# Patient Record
Sex: Female | Born: 1990 | Race: White | Hispanic: No | Marital: Single | Smoking: Former smoker
Health system: Southern US, Community
[De-identification: ages and names within clinical notes are randomized; demographics above are authoritative.]

## PROBLEM LIST (undated history)

## (undated) ENCOUNTER — Inpatient Hospital Stay (HOSPITAL_COMMUNITY): Payer: Self-pay

## (undated) ENCOUNTER — Inpatient Hospital Stay: Payer: Self-pay

## (undated) DIAGNOSIS — O418X90 Other specified disorders of amniotic fluid and membranes, unspecified trimester, not applicable or unspecified: Principal | ICD-10-CM

## (undated) DIAGNOSIS — IMO0002 Reserved for concepts with insufficient information to code with codable children: Secondary | ICD-10-CM

## (undated) DIAGNOSIS — K219 Gastro-esophageal reflux disease without esophagitis: Secondary | ICD-10-CM

## (undated) DIAGNOSIS — O468X9 Other antepartum hemorrhage, unspecified trimester: Principal | ICD-10-CM

## (undated) DIAGNOSIS — Z87440 Personal history of urinary (tract) infections: Secondary | ICD-10-CM

## (undated) DIAGNOSIS — R87629 Unspecified abnormal cytological findings in specimens from vagina: Secondary | ICD-10-CM

## (undated) DIAGNOSIS — O09899 Supervision of other high risk pregnancies, unspecified trimester: Secondary | ICD-10-CM

## (undated) DIAGNOSIS — A749 Chlamydial infection, unspecified: Secondary | ICD-10-CM

## (undated) DIAGNOSIS — R87619 Unspecified abnormal cytological findings in specimens from cervix uteri: Secondary | ICD-10-CM

## (undated) DIAGNOSIS — D649 Anemia, unspecified: Secondary | ICD-10-CM

## (undated) HISTORY — DX: Personal history of urinary (tract) infections: Z87.440

## (undated) HISTORY — DX: Other specified disorders of amniotic fluid and membranes, unspecified trimester, not applicable or unspecified: O41.8X90

## (undated) HISTORY — DX: Reserved for concepts with insufficient information to code with codable children: IMO0002

## (undated) HISTORY — DX: Supervision of other high risk pregnancies, unspecified trimester: O09.899

## (undated) HISTORY — PX: DILATION AND CURETTAGE OF UTERUS: SHX78

## (undated) HISTORY — DX: Unspecified abnormal cytological findings in specimens from vagina: R87.629

## (undated) HISTORY — DX: Other antepartum hemorrhage, unspecified trimester: O46.8X9

## (undated) HISTORY — DX: Anemia, unspecified: D64.9

## (undated) HISTORY — PX: INDUCED ABORTION: SHX677

## (undated) HISTORY — DX: Unspecified abnormal cytological findings in specimens from cervix uteri: R87.619

---
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
## 2008-01-09 ENCOUNTER — Inpatient Hospital Stay (HOSPITAL_COMMUNITY): Admission: AD | Admit: 2008-01-09 | Discharge: 2008-01-09 | Payer: Self-pay | Admitting: Family Medicine

## 2008-01-13 ENCOUNTER — Inpatient Hospital Stay (HOSPITAL_COMMUNITY): Admission: AD | Admit: 2008-01-13 | Discharge: 2008-01-13 | Payer: Self-pay | Admitting: Obstetrics & Gynecology

## 2008-01-13 ENCOUNTER — Emergency Department (HOSPITAL_COMMUNITY): Admission: EM | Admit: 2008-01-13 | Discharge: 2008-01-13 | Payer: Self-pay | Admitting: Emergency Medicine

## 2008-02-18 ENCOUNTER — Inpatient Hospital Stay (HOSPITAL_COMMUNITY): Admission: AD | Admit: 2008-02-18 | Discharge: 2008-02-19 | Payer: Self-pay | Admitting: Obstetrics & Gynecology

## 2008-09-07 ENCOUNTER — Inpatient Hospital Stay (HOSPITAL_COMMUNITY): Admission: AD | Admit: 2008-09-07 | Discharge: 2008-09-07 | Payer: Self-pay | Admitting: Obstetrics and Gynecology

## 2008-10-14 ENCOUNTER — Inpatient Hospital Stay (HOSPITAL_COMMUNITY): Admission: AD | Admit: 2008-10-14 | Discharge: 2008-10-14 | Payer: Self-pay | Admitting: Obstetrics & Gynecology

## 2008-10-27 ENCOUNTER — Ambulatory Visit: Payer: Self-pay | Admitting: Obstetrics & Gynecology

## 2008-10-27 ENCOUNTER — Encounter: Payer: Self-pay | Admitting: Obstetrics and Gynecology

## 2008-10-27 LAB — CONVERTED CEMR LAB: hCG, Beta Chain, Quant, S: 97.4 milliintl units/mL

## 2008-11-30 ENCOUNTER — Emergency Department (HOSPITAL_COMMUNITY): Admission: EM | Admit: 2008-11-30 | Discharge: 2008-11-30 | Payer: Self-pay | Admitting: Emergency Medicine

## 2009-01-24 ENCOUNTER — Inpatient Hospital Stay (HOSPITAL_COMMUNITY): Admission: AD | Admit: 2009-01-24 | Discharge: 2009-01-24 | Payer: Self-pay | Admitting: Obstetrics & Gynecology

## 2009-04-29 ENCOUNTER — Inpatient Hospital Stay (HOSPITAL_COMMUNITY): Admission: AD | Admit: 2009-04-29 | Discharge: 2009-04-29 | Payer: Self-pay | Admitting: Obstetrics & Gynecology

## 2009-07-02 ENCOUNTER — Inpatient Hospital Stay (HOSPITAL_COMMUNITY): Admission: AD | Admit: 2009-07-02 | Discharge: 2009-07-02 | Payer: Self-pay | Admitting: Obstetrics and Gynecology

## 2009-07-31 ENCOUNTER — Inpatient Hospital Stay (HOSPITAL_COMMUNITY): Admission: AD | Admit: 2009-07-31 | Discharge: 2009-07-31 | Payer: Self-pay | Admitting: Family Medicine

## 2009-07-31 ENCOUNTER — Encounter: Payer: Self-pay | Admitting: Family Medicine

## 2009-07-31 ENCOUNTER — Ambulatory Visit: Payer: Self-pay | Admitting: Obstetrics and Gynecology

## 2009-08-02 ENCOUNTER — Ambulatory Visit: Payer: Self-pay | Admitting: Obstetrics and Gynecology

## 2009-08-02 ENCOUNTER — Ambulatory Visit (HOSPITAL_COMMUNITY): Admission: RE | Admit: 2009-08-02 | Discharge: 2009-08-02 | Payer: Self-pay | Admitting: Family Medicine

## 2009-08-14 ENCOUNTER — Encounter: Payer: Self-pay | Admitting: Family Medicine

## 2009-08-14 ENCOUNTER — Inpatient Hospital Stay (HOSPITAL_COMMUNITY): Admission: RE | Admit: 2009-08-14 | Discharge: 2009-08-14 | Payer: Self-pay | Admitting: Family Medicine

## 2009-08-14 ENCOUNTER — Ambulatory Visit: Payer: Self-pay | Admitting: Advanced Practice Midwife

## 2009-09-09 ENCOUNTER — Emergency Department (HOSPITAL_COMMUNITY): Admission: EM | Admit: 2009-09-09 | Discharge: 2009-09-09 | Payer: Self-pay | Admitting: Emergency Medicine

## 2009-09-23 ENCOUNTER — Inpatient Hospital Stay (HOSPITAL_COMMUNITY): Admission: AD | Admit: 2009-09-23 | Discharge: 2009-09-23 | Payer: Self-pay | Admitting: Obstetrics & Gynecology

## 2009-12-20 ENCOUNTER — Emergency Department (HOSPITAL_COMMUNITY): Admission: EM | Admit: 2009-12-20 | Discharge: 2009-12-20 | Payer: Self-pay | Admitting: Emergency Medicine

## 2010-02-12 ENCOUNTER — Inpatient Hospital Stay (HOSPITAL_COMMUNITY): Admission: AD | Admit: 2010-02-12 | Discharge: 2010-02-12 | Payer: Self-pay | Admitting: Obstetrics & Gynecology

## 2010-03-19 ENCOUNTER — Ambulatory Visit: Payer: Self-pay | Admitting: Nurse Practitioner

## 2010-03-19 ENCOUNTER — Inpatient Hospital Stay (HOSPITAL_COMMUNITY): Admission: AD | Admit: 2010-03-19 | Discharge: 2010-03-19 | Payer: Self-pay | Admitting: Obstetrics & Gynecology

## 2010-05-21 ENCOUNTER — Inpatient Hospital Stay (HOSPITAL_COMMUNITY)
Admission: AD | Admit: 2010-05-21 | Discharge: 2010-05-21 | Payer: Self-pay | Source: Home / Self Care | Attending: Family Medicine | Admitting: Family Medicine

## 2010-05-23 LAB — URINALYSIS, ROUTINE W REFLEX MICROSCOPIC
Bilirubin Urine: NEGATIVE
Hgb urine dipstick: NEGATIVE
Ketones, ur: NEGATIVE mg/dL
Nitrite: NEGATIVE
Protein, ur: NEGATIVE mg/dL
Specific Gravity, Urine: 1.02 (ref 1.005–1.030)
Urine Glucose, Fasting: NEGATIVE mg/dL
Urobilinogen, UA: 0.2 mg/dL (ref 0.0–1.0)
pH: 6.5 (ref 5.0–8.0)

## 2010-05-23 LAB — CBC
HCT: 36.9 % (ref 36.0–46.0)
Hemoglobin: 12.2 g/dL (ref 12.0–15.0)
MCH: 30.1 pg (ref 26.0–34.0)
MCHC: 33.1 g/dL (ref 30.0–36.0)
MCV: 91.1 fL (ref 78.0–100.0)
Platelets: 237 10*3/uL (ref 150–400)
RBC: 4.05 MIL/uL (ref 3.87–5.11)
RDW: 11.5 % (ref 11.5–15.5)
WBC: 5.7 10*3/uL (ref 4.0–10.5)

## 2010-05-23 LAB — WET PREP, GENITAL
Clue Cells Wet Prep HPF POC: NONE SEEN
Trich, Wet Prep: NONE SEEN
Yeast Wet Prep HPF POC: NONE SEEN

## 2010-05-23 LAB — POCT PREGNANCY, URINE: Preg Test, Ur: NEGATIVE

## 2010-05-23 LAB — GC/CHLAMYDIA PROBE AMP, GENITAL
Chlamydia, DNA Probe: NEGATIVE
GC Probe Amp, Genital: NEGATIVE

## 2010-05-27 ENCOUNTER — Encounter: Payer: Self-pay | Admitting: *Deleted

## 2010-07-11 ENCOUNTER — Inpatient Hospital Stay (HOSPITAL_COMMUNITY)
Admission: AD | Admit: 2010-07-11 | Discharge: 2010-07-11 | Disposition: A | Discharge status: Home / Self Care | Payer: Medicaid Other | Source: Ambulatory Visit | Attending: Obstetrics & Gynecology | Admitting: Obstetrics & Gynecology

## 2010-07-11 DIAGNOSIS — N946 Dysmenorrhea, unspecified: Secondary | ICD-10-CM

## 2010-07-11 LAB — WET PREP, GENITAL
Clue Cells Wet Prep HPF POC: NONE SEEN
Trich, Wet Prep: NONE SEEN
Yeast Wet Prep HPF POC: NONE SEEN

## 2010-07-11 LAB — DIFFERENTIAL
Basophils Absolute: 0 10*3/uL (ref 0.0–0.1)
Basophils Relative: 0 % (ref 0–1)
Eosinophils Absolute: 0.1 10*3/uL (ref 0.0–0.7)
Eosinophils Relative: 2 % (ref 0–5)
Lymphocytes Relative: 35 % (ref 12–46)
Lymphs Abs: 2.3 10*3/uL (ref 0.7–4.0)
Monocytes Absolute: 0.4 10*3/uL (ref 0.1–1.0)
Monocytes Relative: 7 % (ref 3–12)
Neutro Abs: 3.7 10*3/uL (ref 1.7–7.7)
Neutrophils Relative %: 56 % (ref 43–77)

## 2010-07-11 LAB — POCT PREGNANCY, URINE: Preg Test, Ur: NEGATIVE

## 2010-07-11 LAB — CBC
HCT: 38.6 % (ref 36.0–46.0)
Hemoglobin: 12.6 g/dL (ref 12.0–15.0)
MCH: 29.6 pg (ref 26.0–34.0)
MCHC: 32.6 g/dL (ref 30.0–36.0)
MCV: 90.6 fL (ref 78.0–100.0)
Platelets: 258 10*3/uL (ref 150–400)
RBC: 4.26 MIL/uL (ref 3.87–5.11)
RDW: 12.4 % (ref 11.5–15.5)
WBC: 6.6 10*3/uL (ref 4.0–10.5)

## 2010-07-11 LAB — HCG, QUANTITATIVE, PREGNANCY: hCG, Beta Chain, Quant, S: <2 m[IU]/mL (ref <5)

## 2010-07-12 LAB — GC/CHLAMYDIA PROBE AMP, GENITAL
Chlamydia, DNA Probe: NEGATIVE
GC Probe Amp, Genital: NEGATIVE

## 2010-07-17 LAB — DIFFERENTIAL
Basophils Absolute: 0 10*3/uL (ref 0.0–0.1)
Basophils Relative: 0 % (ref 0–1)
Eosinophils Absolute: 0 10*3/uL (ref 0.0–0.7)
Eosinophils Relative: 0 % (ref 0–5)
Lymphocytes Relative: 18 % (ref 12–46)
Lymphs Abs: 2.3 10*3/uL (ref 0.7–4.0)
Monocytes Absolute: 0.7 10*3/uL (ref 0.1–1.0)
Monocytes Relative: 6 % (ref 3–12)
Neutro Abs: 9.8 10*3/uL — ABNORMAL HIGH (ref 1.7–7.7)
Neutrophils Relative %: 76 % (ref 43–77)

## 2010-07-17 LAB — CBC
HCT: 30.3 % — ABNORMAL LOW (ref 36.0–46.0)
Hemoglobin: 10.4 g/dL — ABNORMAL LOW (ref 12.0–15.0)
MCH: 32 pg (ref 26.0–34.0)
MCHC: 34.3 g/dL (ref 30.0–36.0)
MCV: 93.3 fL (ref 78.0–100.0)
Platelets: 250 10*3/uL (ref 150–400)
RBC: 3.25 MIL/uL — ABNORMAL LOW (ref 3.87–5.11)
RDW: 12.9 % (ref 11.5–15.5)
WBC: 12.9 10*3/uL — ABNORMAL HIGH (ref 4.0–10.5)

## 2010-07-17 LAB — WET PREP, GENITAL
Clue Cells Wet Prep HPF POC: NONE SEEN
Trich, Wet Prep: NONE SEEN
Yeast Wet Prep HPF POC: NONE SEEN

## 2010-07-17 LAB — SAMPLE TO BLOOD BANK

## 2010-07-17 LAB — GC/CHLAMYDIA PROBE AMP, GENITAL
Chlamydia, DNA Probe: NEGATIVE
GC Probe Amp, Genital: NEGATIVE

## 2010-07-19 LAB — URINE CULTURE
Colony Count: 15000
Culture  Setup Time: 201110110736

## 2010-07-19 LAB — URINALYSIS, ROUTINE W REFLEX MICROSCOPIC
Bilirubin Urine: NEGATIVE
Glucose, UA: NEGATIVE mg/dL
Hgb urine dipstick: NEGATIVE
Ketones, ur: 15 mg/dL — AB
Nitrite: NEGATIVE
Protein, ur: NEGATIVE mg/dL
Specific Gravity, Urine: 1.025 (ref 1.005–1.030)
Urobilinogen, UA: 1 mg/dL (ref 0.0–1.0)
pH: 5.5 (ref 5.0–8.0)

## 2010-07-19 LAB — CBC
HCT: 35.8 % — ABNORMAL LOW (ref 36.0–46.0)
Hemoglobin: 12.2 g/dL (ref 12.0–15.0)
MCH: 31.9 pg (ref 26.0–34.0)
MCHC: 34 g/dL (ref 30.0–36.0)
MCV: 93.6 fL (ref 78.0–100.0)
Platelets: 245 10*3/uL (ref 150–400)
RBC: 3.82 MIL/uL — ABNORMAL LOW (ref 3.87–5.11)
RDW: 12.7 % (ref 11.5–15.5)
WBC: 9.8 10*3/uL (ref 4.0–10.5)

## 2010-07-19 LAB — WET PREP, GENITAL
Clue Cells Wet Prep HPF POC: NONE SEEN
Trich, Wet Prep: NONE SEEN
Yeast Wet Prep HPF POC: NONE SEEN

## 2010-07-19 LAB — GC/CHLAMYDIA PROBE AMP, GENITAL
Chlamydia, DNA Probe: NEGATIVE
GC Probe Amp, Genital: NEGATIVE

## 2010-07-19 LAB — ABO/RH: ABO/RH(D): O POS

## 2010-07-19 LAB — URINE MICROSCOPIC-ADD ON

## 2010-07-19 LAB — POCT PREGNANCY, URINE: Preg Test, Ur: POSITIVE

## 2010-07-23 LAB — URINE MICROSCOPIC-ADD ON

## 2010-07-23 LAB — URINALYSIS, ROUTINE W REFLEX MICROSCOPIC
Bilirubin Urine: NEGATIVE
Glucose, UA: NEGATIVE mg/dL
Hgb urine dipstick: NEGATIVE
Ketones, ur: NEGATIVE mg/dL
Nitrite: NEGATIVE
Protein, ur: NEGATIVE mg/dL
Specific Gravity, Urine: >1.03 — ABNORMAL HIGH (ref 1.005–1.030)
Urobilinogen, UA: 0.2 mg/dL (ref 0.0–1.0)
pH: 6 (ref 5.0–8.0)

## 2010-07-23 LAB — URINE CULTURE
Colony Count: NO GROWTH
Culture: NO GROWTH

## 2010-07-23 LAB — POCT PREGNANCY, URINE
Preg Test, Ur: NEGATIVE
Preg Test, Ur: POSITIVE

## 2010-07-29 LAB — POCT PREGNANCY, URINE: Preg Test, Ur: POSITIVE

## 2010-07-29 LAB — CBC
HCT: 36.9 % (ref 36.0–46.0)
Hemoglobin: 12.5 g/dL (ref 12.0–15.0)
MCHC: 33.8 g/dL (ref 30.0–36.0)
MCV: 93.2 fL (ref 78.0–100.0)
Platelets: 245 10*3/uL (ref 150–400)
RBC: 3.96 MIL/uL (ref 3.87–5.11)
RDW: 12.5 % (ref 11.5–15.5)
WBC: 7.9 10*3/uL (ref 4.0–10.5)

## 2010-07-29 LAB — GC/CHLAMYDIA PROBE AMP, GENITAL
Chlamydia, DNA Probe: NEGATIVE
GC Probe Amp, Genital: NEGATIVE

## 2010-07-29 LAB — WET PREP, GENITAL
Trich, Wet Prep: NONE SEEN
Yeast Wet Prep HPF POC: NONE SEEN

## 2010-07-29 LAB — HCG, QUANTITATIVE, PREGNANCY
hCG, Beta Chain, Quant, S: 2154 m[IU]/mL — ABNORMAL HIGH (ref <5)
hCG, Beta Chain, Quant, S: 5162 m[IU]/mL — ABNORMAL HIGH (ref <5)

## 2010-08-06 LAB — URINALYSIS, ROUTINE W REFLEX MICROSCOPIC
Bilirubin Urine: NEGATIVE
Glucose, UA: NEGATIVE mg/dL
Hgb urine dipstick: NEGATIVE
Ketones, ur: NEGATIVE mg/dL
Nitrite: NEGATIVE
Protein, ur: NEGATIVE mg/dL
Specific Gravity, Urine: 1.02 (ref 1.005–1.030)
Urobilinogen, UA: 1 mg/dL (ref 0.0–1.0)
pH: 6 (ref 5.0–8.0)

## 2010-08-06 LAB — POCT PREGNANCY, URINE: Preg Test, Ur: NEGATIVE

## 2010-08-10 LAB — CBC
HCT: 34.4 % — ABNORMAL LOW (ref 36.0–46.0)
Hemoglobin: 11.7 g/dL — ABNORMAL LOW (ref 12.0–15.0)
MCHC: 34 g/dL (ref 30.0–36.0)
MCV: 90.8 fL (ref 78.0–100.0)
Platelets: 228 10*3/uL (ref 150–400)
RBC: 3.78 MIL/uL — ABNORMAL LOW (ref 3.87–5.11)
RDW: 13.3 % (ref 11.5–15.5)
WBC: 9.7 10*3/uL (ref 4.0–10.5)

## 2010-08-10 LAB — URINALYSIS, ROUTINE W REFLEX MICROSCOPIC
Bilirubin Urine: NEGATIVE
Glucose, UA: NEGATIVE mg/dL
Hgb urine dipstick: NEGATIVE
Ketones, ur: NEGATIVE mg/dL
Nitrite: NEGATIVE
Protein, ur: NEGATIVE mg/dL
Specific Gravity, Urine: 1.025 (ref 1.005–1.030)
Urobilinogen, UA: 1 mg/dL (ref 0.0–1.0)
pH: 5.5 (ref 5.0–8.0)

## 2010-08-10 LAB — WET PREP, GENITAL
Trich, Wet Prep: NONE SEEN
Yeast Wet Prep HPF POC: NONE SEEN

## 2010-08-10 LAB — GC/CHLAMYDIA PROBE AMP, GENITAL
Chlamydia, DNA Probe: NEGATIVE
GC Probe Amp, Genital: NEGATIVE

## 2010-08-10 LAB — ABO/RH: ABO/RH(D): O POS

## 2010-08-10 LAB — POCT PREGNANCY, URINE: Preg Test, Ur: POSITIVE

## 2010-08-10 LAB — HCG, QUANTITATIVE, PREGNANCY: hCG, Beta Chain, Quant, S: 5802 m[IU]/mL — ABNORMAL HIGH (ref <5)

## 2010-08-13 LAB — CBC
HCT: 34.1 % — ABNORMAL LOW (ref 36.0–46.0)
Hemoglobin: 12.1 g/dL (ref 12.0–15.0)
MCHC: 35.5 g/dL (ref 30.0–36.0)
MCV: 92.5 fL (ref 78.0–100.0)
Platelets: 229 10*3/uL (ref 150–400)
RBC: 3.69 MIL/uL — ABNORMAL LOW (ref 3.87–5.11)
RDW: 12.9 % (ref 11.5–15.5)
WBC: 14.4 10*3/uL — ABNORMAL HIGH (ref 4.0–10.5)

## 2010-08-13 LAB — WET PREP, GENITAL
Trich, Wet Prep: NONE SEEN
Yeast Wet Prep HPF POC: NONE SEEN

## 2010-08-13 LAB — ABO/RH: ABO/RH(D): O POS

## 2010-08-14 LAB — CBC
HCT: 34.6 % — ABNORMAL LOW (ref 36.0–46.0)
Hemoglobin: 11.9 g/dL — ABNORMAL LOW (ref 12.0–15.0)
MCHC: 34.4 g/dL (ref 30.0–36.0)
MCV: 93.1 fL (ref 78.0–100.0)
Platelets: 248 10*3/uL (ref 150–400)
RBC: 3.71 MIL/uL — ABNORMAL LOW (ref 3.87–5.11)
RDW: 12.7 % (ref 11.5–15.5)
WBC: 10.5 10*3/uL (ref 4.0–10.5)

## 2010-08-14 LAB — URINALYSIS, ROUTINE W REFLEX MICROSCOPIC
Bilirubin Urine: NEGATIVE
Glucose, UA: NEGATIVE mg/dL
Hgb urine dipstick: NEGATIVE
Ketones, ur: NEGATIVE mg/dL
Nitrite: NEGATIVE
Protein, ur: NEGATIVE mg/dL
Specific Gravity, Urine: >1.03 — ABNORMAL HIGH (ref 1.005–1.030)
Urobilinogen, UA: 1 mg/dL (ref 0.0–1.0)
pH: 6 (ref 5.0–8.0)

## 2010-08-14 LAB — GC/CHLAMYDIA PROBE AMP, GENITAL
Chlamydia, DNA Probe: NEGATIVE
GC Probe Amp, Genital: NEGATIVE

## 2010-08-14 LAB — WET PREP, GENITAL
Trich, Wet Prep: NONE SEEN
Yeast Wet Prep HPF POC: NONE SEEN

## 2010-08-14 LAB — HCG, QUANTITATIVE, PREGNANCY: hCG, Beta Chain, Quant, S: 101640 m[IU]/mL — ABNORMAL HIGH (ref <5)

## 2010-08-14 LAB — ABO/RH: ABO/RH(D): O POS

## 2010-08-19 ENCOUNTER — Inpatient Hospital Stay (HOSPITAL_COMMUNITY): Payer: Medicaid Other

## 2010-08-19 ENCOUNTER — Inpatient Hospital Stay (HOSPITAL_COMMUNITY)
Admission: AD | Admit: 2010-08-19 | Discharge: 2010-08-19 | Disposition: A | Discharge status: Home / Self Care | Payer: Medicaid Other | Source: Ambulatory Visit | Attending: Obstetrics & Gynecology | Admitting: Obstetrics & Gynecology

## 2010-08-19 DIAGNOSIS — N949 Unspecified condition associated with female genital organs and menstrual cycle: Secondary | ICD-10-CM

## 2010-08-19 DIAGNOSIS — R1032 Left lower quadrant pain: Secondary | ICD-10-CM | POA: Insufficient documentation

## 2010-08-19 LAB — URINALYSIS, ROUTINE W REFLEX MICROSCOPIC
Bilirubin Urine: NEGATIVE
Glucose, UA: NEGATIVE mg/dL
Hgb urine dipstick: NEGATIVE
Ketones, ur: NEGATIVE mg/dL
Nitrite: NEGATIVE
Protein, ur: NEGATIVE mg/dL
Specific Gravity, Urine: 1.02 (ref 1.005–1.030)
Urobilinogen, UA: 1 mg/dL (ref 0.0–1.0)
pH: 7 (ref 5.0–8.0)

## 2010-08-19 LAB — WET PREP, GENITAL
Clue Cells Wet Prep HPF POC: NONE SEEN
Trich, Wet Prep: NONE SEEN
Yeast Wet Prep HPF POC: NONE SEEN

## 2010-08-19 LAB — POCT PREGNANCY, URINE: Preg Test, Ur: NEGATIVE

## 2010-08-20 LAB — GC/CHLAMYDIA PROBE AMP, GENITAL
Chlamydia, DNA Probe: NEGATIVE
GC Probe Amp, Genital: NEGATIVE

## 2010-09-18 NOTE — Group Therapy Note (Signed)
NAMETIMBERLEE, ROBLERO             ACCOUNT NO.:  [id number]   MEDICAL RECORD NO.:  [id number]          PATIENT TYPE:  WOC   LOCATION:  [facility]                   FACILITY:  [facility]   PHYSICIAN:  Argentina Donovan, MD        DATE OF BIRTH:  Oct 11, 1990   DATE OF SERVICE:  10/27/2008                                  CLINIC NOTE   The patient is a 20 year old gravida 2, para 1-0-1-1 who was seen in the  maternity admissions on October 14, 2008, with a missed AB treated with  Cytotec.  She last delivered in December of last year and she was given  the Cytotec on a Thursday and she did not pass the products of  conception until the following Thursday, i.e., 1 week ago.  She has not  had any bleeding since that time and we are going to get a quantitative  beta to see if her beta has significantly dropped or whether she may  have a possibility of an incomplete and need an ultrasound to confirm  that.  The patient plans on getting pregnant soon.  I have talked to her  about avoiding pregnancy the next 2 months, to start on vitamins with  folic acid, and why she should do that.  We had a long talk about  incidence and frequency of miscarriage and she seems to understand.  She  also wanted to know about determining when she could ovulate and how to  determine ovulation.  We talked about temperature chart and ovulating  predicting testing devices.   IMPRESSION:  Probable complete abortion pending quantitative beta.           ______________________________  Argentina Donovan, MD     PR/MEDQ  D:  10/27/2008  T:  10/27/2008  Job:  [id number]

## 2010-10-10 ENCOUNTER — Inpatient Hospital Stay (HOSPITAL_COMMUNITY)
Admission: AD | Admit: 2010-10-10 | Discharge: 2010-10-10 | Disposition: A | Discharge status: Home / Self Care | Payer: Medicaid Other | Source: Ambulatory Visit | Attending: Obstetrics & Gynecology | Admitting: Obstetrics & Gynecology

## 2010-10-10 ENCOUNTER — Inpatient Hospital Stay (HOSPITAL_COMMUNITY): Payer: Medicaid Other

## 2010-10-10 DIAGNOSIS — O200 Threatened abortion: Secondary | ICD-10-CM | POA: Insufficient documentation

## 2010-10-10 DIAGNOSIS — O209 Hemorrhage in early pregnancy, unspecified: Secondary | ICD-10-CM

## 2010-10-10 LAB — URINALYSIS, ROUTINE W REFLEX MICROSCOPIC
Bilirubin Urine: NEGATIVE
Glucose, UA: NEGATIVE mg/dL
Hgb urine dipstick: NEGATIVE
Ketones, ur: NEGATIVE mg/dL
Nitrite: NEGATIVE
Protein, ur: NEGATIVE mg/dL
Specific Gravity, Urine: 1.015 (ref 1.005–1.030)
Urobilinogen, UA: 0.2 mg/dL (ref 0.0–1.0)
pH: 6.5 (ref 5.0–8.0)

## 2010-10-10 LAB — CBC
HCT: 35.9 % — ABNORMAL LOW (ref 36.0–46.0)
Hemoglobin: 12.3 g/dL (ref 12.0–15.0)
MCH: 31.1 pg (ref 26.0–34.0)
MCHC: 34.3 g/dL (ref 30.0–36.0)
MCV: 90.7 fL (ref 78.0–100.0)
Platelets: 232 10*3/uL (ref 150–400)
RBC: 3.96 MIL/uL (ref 3.87–5.11)
RDW: 11.8 % (ref 11.5–15.5)
WBC: 8 10*3/uL (ref 4.0–10.5)

## 2010-10-10 LAB — HCG, QUANTITATIVE, PREGNANCY: hCG, Beta Chain, Quant, S: 11079 m[IU]/mL — ABNORMAL HIGH (ref <5)

## 2010-10-10 LAB — ABO/RH: ABO/RH(D): O POS

## 2010-10-10 LAB — WET PREP, GENITAL
Clue Cells Wet Prep HPF POC: NONE SEEN
Trich, Wet Prep: NONE SEEN
Yeast Wet Prep HPF POC: NONE SEEN

## 2010-10-10 LAB — POCT PREGNANCY, URINE: Preg Test, Ur: POSITIVE

## 2010-10-11 LAB — GC/CHLAMYDIA PROBE AMP, GENITAL
Chlamydia, DNA Probe: NEGATIVE
GC Probe Amp, Genital: NEGATIVE

## 2010-10-20 ENCOUNTER — Emergency Department (HOSPITAL_COMMUNITY)
Admission: EM | Admit: 2010-10-20 | Discharge: 2010-10-21 | Disposition: A | Discharge status: Home / Self Care | Payer: Medicaid Other | Attending: Emergency Medicine | Admitting: Emergency Medicine

## 2010-10-20 DIAGNOSIS — N760 Acute vaginitis: Secondary | ICD-10-CM | POA: Insufficient documentation

## 2010-10-20 DIAGNOSIS — A499 Bacterial infection, unspecified: Secondary | ICD-10-CM | POA: Insufficient documentation

## 2010-10-20 DIAGNOSIS — B9689 Other specified bacterial agents as the cause of diseases classified elsewhere: Secondary | ICD-10-CM | POA: Insufficient documentation

## 2010-10-20 DIAGNOSIS — O2390 Unspecified genitourinary tract infection in pregnancy, unspecified trimester: Secondary | ICD-10-CM | POA: Insufficient documentation

## 2010-10-21 LAB — URINALYSIS, ROUTINE W REFLEX MICROSCOPIC
Bilirubin Urine: NEGATIVE
Glucose, UA: NEGATIVE mg/dL
Hgb urine dipstick: NEGATIVE
Ketones, ur: NEGATIVE mg/dL
Nitrite: NEGATIVE
Protein, ur: NEGATIVE mg/dL
Specific Gravity, Urine: 1.024 (ref 1.005–1.030)
Urobilinogen, UA: 1 mg/dL (ref 0.0–1.0)
pH: 6 (ref 5.0–8.0)

## 2010-10-21 LAB — URINE MICROSCOPIC-ADD ON

## 2010-10-21 LAB — WET PREP, GENITAL
Trich, Wet Prep: NONE SEEN
Yeast Wet Prep HPF POC: NONE SEEN

## 2010-10-22 ENCOUNTER — Inpatient Hospital Stay (HOSPITAL_COMMUNITY)
Admission: AD | Admit: 2010-10-22 | Discharge: 2010-10-22 | Disposition: A | Discharge status: Home / Self Care | Payer: Medicaid Other | Source: Ambulatory Visit | Attending: Family Medicine | Admitting: Family Medicine

## 2010-10-22 ENCOUNTER — Ambulatory Visit (HOSPITAL_COMMUNITY)
Admission: RE | Admit: 2010-10-22 | Discharge: 2010-10-22 | Disposition: A | Discharge status: Home / Self Care | Payer: Medicaid Other | Source: Ambulatory Visit | Attending: Obstetrics & Gynecology | Admitting: Obstetrics & Gynecology

## 2010-10-22 DIAGNOSIS — O209 Hemorrhage in early pregnancy, unspecified: Secondary | ICD-10-CM | POA: Insufficient documentation

## 2010-10-22 DIAGNOSIS — O99891 Other specified diseases and conditions complicating pregnancy: Secondary | ICD-10-CM | POA: Insufficient documentation

## 2010-10-22 DIAGNOSIS — O468X9 Other antepartum hemorrhage, unspecified trimester: Secondary | ICD-10-CM

## 2010-10-22 DIAGNOSIS — O418X90 Other specified disorders of amniotic fluid and membranes, unspecified trimester, not applicable or unspecified: Secondary | ICD-10-CM

## 2010-10-22 DIAGNOSIS — Z3689 Encounter for other specified antenatal screening: Secondary | ICD-10-CM | POA: Insufficient documentation

## 2010-10-22 HISTORY — DX: Other specified disorders of amniotic fluid and membranes, unspecified trimester, not applicable or unspecified: O41.8X90

## 2010-10-22 HISTORY — DX: Other antepartum hemorrhage, unspecified trimester: O46.8X9

## 2010-10-22 LAB — GC/CHLAMYDIA PROBE AMP, GENITAL
Chlamydia, DNA Probe: NEGATIVE
GC Probe Amp, Genital: NEGATIVE

## 2010-12-02 ENCOUNTER — Encounter (HOSPITAL_COMMUNITY): Payer: Self-pay | Admitting: *Deleted

## 2010-12-02 ENCOUNTER — Inpatient Hospital Stay (HOSPITAL_COMMUNITY)
Admission: AD | Admit: 2010-12-02 | Discharge: 2010-12-02 | Disposition: A | Discharge status: Home / Self Care | Payer: Medicaid Other | Source: Ambulatory Visit | Attending: Obstetrics & Gynecology | Admitting: Obstetrics & Gynecology

## 2010-12-02 DIAGNOSIS — Z331 Pregnant state, incidental: Secondary | ICD-10-CM

## 2010-12-02 DIAGNOSIS — Z3480 Encounter for supervision of other normal pregnancy, unspecified trimester: Secondary | ICD-10-CM | POA: Insufficient documentation

## 2010-12-02 LAB — URINALYSIS, ROUTINE W REFLEX MICROSCOPIC
Bilirubin Urine: NEGATIVE
Glucose, UA: NEGATIVE mg/dL
Hgb urine dipstick: NEGATIVE
Ketones, ur: NEGATIVE mg/dL
Leukocytes, UA: NEGATIVE
Nitrite: NEGATIVE
Protein, ur: NEGATIVE mg/dL
Specific Gravity, Urine: >1.03 — ABNORMAL HIGH (ref 1.005–1.030)
Urobilinogen, UA: 2 mg/dL — ABNORMAL HIGH (ref 0.0–1.0)
pH: 6 (ref 5.0–8.0)

## 2010-12-02 LAB — WET PREP, GENITAL
Clue Cells Wet Prep HPF POC: NONE SEEN
Trich, Wet Prep: NONE SEEN
Yeast Wet Prep HPF POC: NONE SEEN

## 2010-12-02 MED ORDER — PRENATAL RX 60-1 MG PO TABS: 1.0000 | ORAL_TABLET | Freq: Every day | ORAL | Status: DC
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
## 2010-12-02 NOTE — Progress Notes (Signed)
W. Muhammad, CNM at bedside.  Assessment done and poc discussed with pt.   
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
## 2010-12-08 ENCOUNTER — Inpatient Hospital Stay (HOSPITAL_COMMUNITY)
Admission: AD | Admit: 2010-12-08 | Discharge: 2010-12-08 | Disposition: A | Discharge status: Home / Self Care | Payer: Medicaid Other | Source: Ambulatory Visit | Attending: Obstetrics & Gynecology | Admitting: Obstetrics & Gynecology

## 2010-12-08 ENCOUNTER — Encounter (HOSPITAL_COMMUNITY): Payer: Self-pay | Admitting: *Deleted

## 2010-12-08 ENCOUNTER — Inpatient Hospital Stay (HOSPITAL_COMMUNITY): Payer: Medicaid Other

## 2010-12-08 DIAGNOSIS — M549 Dorsalgia, unspecified: Secondary | ICD-10-CM | POA: Insufficient documentation

## 2010-12-08 DIAGNOSIS — T148XXA Other injury of unspecified body region, initial encounter: Secondary | ICD-10-CM

## 2010-12-08 DIAGNOSIS — O99891 Other specified diseases and conditions complicating pregnancy: Secondary | ICD-10-CM | POA: Insufficient documentation

## 2010-12-08 DIAGNOSIS — R109 Unspecified abdominal pain: Secondary | ICD-10-CM | POA: Insufficient documentation

## 2010-12-08 LAB — URINALYSIS, ROUTINE W REFLEX MICROSCOPIC
Bilirubin Urine: NEGATIVE
Glucose, UA: NEGATIVE mg/dL
Hgb urine dipstick: NEGATIVE
Ketones, ur: 15 mg/dL — AB
Nitrite: NEGATIVE
Protein, ur: NEGATIVE mg/dL
Specific Gravity, Urine: >1.03 — ABNORMAL HIGH (ref 1.005–1.030)
Urobilinogen, UA: 1 mg/dL (ref 0.0–1.0)
pH: 5.5 (ref 5.0–8.0)

## 2010-12-08 LAB — URINE MICROSCOPIC-ADD ON
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
## 2011-01-15 ENCOUNTER — Other Ambulatory Visit: Payer: Self-pay | Admitting: Family Medicine

## 2011-01-15 DIAGNOSIS — Z3689 Encounter for other specified antenatal screening: Secondary | ICD-10-CM

## 2011-01-15 LAB — HIV ANTIBODY (ROUTINE TESTING W REFLEX)
HIV: NONREACTIVE
HIV: NONREACTIVE

## 2011-01-15 LAB — RPR: RPR: NONREACTIVE

## 2011-01-15 LAB — HEPATITIS B SURFACE ANTIGEN: Hepatitis B Surface Ag: NEGATIVE

## 2011-01-17 ENCOUNTER — Ambulatory Visit (HOSPITAL_COMMUNITY)
Admission: RE | Admit: 2011-01-17 | Discharge: 2011-01-17 | Disposition: A | Discharge status: Home / Self Care | Payer: Medicaid Other | Source: Ambulatory Visit | Attending: Family Medicine | Admitting: Family Medicine

## 2011-01-17 DIAGNOSIS — Z3689 Encounter for other specified antenatal screening: Secondary | ICD-10-CM

## 2011-01-17 DIAGNOSIS — Z1389 Encounter for screening for other disorder: Secondary | ICD-10-CM | POA: Insufficient documentation

## 2011-01-17 DIAGNOSIS — Z363 Encounter for antenatal screening for malformations: Secondary | ICD-10-CM | POA: Insufficient documentation

## 2011-01-17 DIAGNOSIS — O358XX0 Maternal care for other (suspected) fetal abnormality and damage, not applicable or unspecified: Secondary | ICD-10-CM | POA: Insufficient documentation

## 2011-01-23 DIAGNOSIS — Z862 Personal history of diseases of the blood and blood-forming organs and certain disorders involving the immune mechanism: Secondary | ICD-10-CM | POA: Insufficient documentation

## 2011-01-23 DIAGNOSIS — Z87440 Personal history of urinary (tract) infections: Secondary | ICD-10-CM | POA: Insufficient documentation

## 2011-01-23 DIAGNOSIS — O468X9 Other antepartum hemorrhage, unspecified trimester: Secondary | ICD-10-CM

## 2011-01-23 DIAGNOSIS — O09899 Supervision of other high risk pregnancies, unspecified trimester: Secondary | ICD-10-CM | POA: Insufficient documentation

## 2011-01-23 DIAGNOSIS — O418X90 Other specified disorders of amniotic fluid and membranes, unspecified trimester, not applicable or unspecified: Secondary | ICD-10-CM | POA: Insufficient documentation

## 2011-01-23 DIAGNOSIS — R58 Hemorrhage, not elsewhere classified: Secondary | ICD-10-CM

## 2011-01-24 ENCOUNTER — Other Ambulatory Visit: Payer: Self-pay | Admitting: Obstetrics & Gynecology

## 2011-01-24 ENCOUNTER — Ambulatory Visit (HOSPITAL_COMMUNITY): Payer: Medicaid Other

## 2011-01-24 ENCOUNTER — Ambulatory Visit (INDEPENDENT_AMBULATORY_CARE_PROVIDER_SITE_OTHER): Payer: Medicaid Other | Admitting: Physician Assistant

## 2011-01-24 VITALS — BP 132/75 | Temp 98.4°F | Wt 193.1 lb

## 2011-01-24 DIAGNOSIS — O358XX0 Maternal care for other (suspected) fetal abnormality and damage, not applicable or unspecified: Secondary | ICD-10-CM

## 2011-01-24 DIAGNOSIS — Z87440 Personal history of urinary (tract) infections: Secondary | ICD-10-CM

## 2011-01-24 DIAGNOSIS — O1500 Eclampsia in pregnancy, unspecified trimester: Secondary | ICD-10-CM

## 2011-01-24 DIAGNOSIS — O152 Eclampsia in the puerperium: Secondary | ICD-10-CM

## 2011-01-24 LAB — POCT URINALYSIS DIP (DEVICE)
Bilirubin Urine: NEGATIVE
Glucose, UA: NEGATIVE mg/dL
Hgb urine dipstick: NEGATIVE
Ketones, ur: NEGATIVE mg/dL
Nitrite: NEGATIVE
Protein, ur: NEGATIVE mg/dL
Specific Gravity, Urine: 1.02 (ref 1.005–1.030)
Urobilinogen, UA: 0.2 mg/dL (ref 0.0–1.0)
pH: 8.5 — ABNORMAL HIGH (ref 5.0–8.0)

## 2011-01-24 NOTE — Progress Notes (Signed)
Addended by: Sherre Lain A on: 01/24/2011 10:21 AM   Modules accepted: Orders

## 2011-01-24 NOTE — Progress Notes (Signed)
Pulse 98. Pain in lower abdomen when standing. Vaginal discharge is clear no itching or no odor. Pt needs to see social worker and to see nutrition at next visit.

## 2011-01-24 NOTE — Progress Notes (Signed)
Addended by: Darrel Hoover on: 01/24/2011 10:13 AM   Modules accepted: Orders
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
## 2011-01-24 NOTE — Progress Notes (Signed)
No complaints. +FM, left ventricle EF noted on anatomy scan, Quad screen pending. Referral to MFM for repeat US. BL 24 hour urine and labs today.

## 2011-01-25 LAB — PROTEIN, URINE, 24 HOUR
Collection Interval-UPROT: 24
Protein, 24H Urine: 153 — ABNORMAL HIGH
Protein, Urine: 9
Urine Total Volume-UPROT: 1700

## 2011-01-25 LAB — URINALYSIS, ROUTINE W REFLEX MICROSCOPIC
Bilirubin Urine: NEGATIVE
Bilirubin Urine: NEGATIVE
Bilirubin Urine: NEGATIVE
Glucose, UA: NEGATIVE
Glucose, UA: NEGATIVE
Glucose, UA: NEGATIVE
Hgb urine dipstick: NEGATIVE
Hgb urine dipstick: NEGATIVE
Hgb urine dipstick: NEGATIVE
Ketones, ur: NEGATIVE
Ketones, ur: NEGATIVE
Ketones, ur: NEGATIVE
Leukocytes, UA: NEGATIVE
Nitrite: NEGATIVE
Nitrite: NEGATIVE
Nitrite: NEGATIVE
Protein, ur: NEGATIVE
Protein, ur: 30 — AB
Protein, ur: NEGATIVE
Specific Gravity, Urine: >1.03 — ABNORMAL HIGH
Specific Gravity, Urine: 1.025
Specific Gravity, Urine: 1.025
Urobilinogen, UA: 0.2
Urobilinogen, UA: 0.2
Urobilinogen, UA: 0.2
pH: 6
pH: 6
pH: 7

## 2011-01-25 LAB — CBC
HCT: 30.1 — ABNORMAL LOW
Hemoglobin: 10.6 — ABNORMAL LOW
MCHC: 35.2
MCV: 91.2
Platelets: 237
RBC: 3.3 — ABNORMAL LOW
RDW: 12
WBC: 14.4 — ABNORMAL HIGH

## 2011-01-25 LAB — COMPREHENSIVE METABOLIC PANEL
ALT: 40 — ABNORMAL HIGH
AST: 36
Albumin: 2.5 — ABNORMAL LOW
Alkaline Phosphatase: 107
BUN: 6
CO2: 25
Calcium: 8.5
Chloride: 107
Creatinine, Ser: 0.61
GFR calc Af Amer: >60
GFR calc non Af Amer: >60
Glucose, Bld: 112 — ABNORMAL HIGH
Potassium: 3.8
Sodium: 138
Total Bilirubin: 0.4
Total Protein: 5.9 — ABNORMAL LOW

## 2011-01-25 LAB — CREATININE CLEARANCE, URINE, 24 HOUR
Collection Interval-CRCL: 24
Creatinine Clearance: 203 — ABNORMAL HIGH
Creatinine, 24H Ur: 1488
Creatinine, Urine: 87.5
Urine Total Volume-CRCL: 1700

## 2011-01-25 LAB — URINE MICROSCOPIC-ADD ON

## 2011-01-25 LAB — POCT URINALYSIS DIP (DEVICE)
Bilirubin Urine: NEGATIVE
Bilirubin Urine: NEGATIVE
Glucose, UA: NEGATIVE
Glucose, UA: NEGATIVE
Hgb urine dipstick: NEGATIVE
Hgb urine dipstick: NEGATIVE
Ketones, ur: NEGATIVE
Ketones, ur: NEGATIVE
Nitrite: NEGATIVE
Nitrite: NEGATIVE
Operator id: 159681
Operator id: 194561
Protein, ur: 100 — AB
Protein, ur: 30 — AB
Specific Gravity, Urine: 1.02
Specific Gravity, Urine: 1.02
Urobilinogen, UA: 0.2
Urobilinogen, UA: 0.2
pH: 7
pH: 7

## 2011-01-25 LAB — WET PREP, GENITAL
Trich, Wet Prep: NONE SEEN
Yeast Wet Prep HPF POC: NONE SEEN

## 2011-01-25 LAB — URIC ACID: Uric Acid, Serum: 5.1

## 2011-01-26 LAB — CULTURE, OB URINE: Colony Count: >=100000

## 2011-01-28 LAB — POCT URINALYSIS DIP (DEVICE)
Glucose, UA: NEGATIVE
Ketones, ur: NEGATIVE
Nitrite: NEGATIVE
Operator id: 297281
Protein, ur: 100 — AB
Specific Gravity, Urine: >=1.03
Urobilinogen, UA: 0.2
pH: 6

## 2011-01-28 LAB — CREATININE CLEARANCE, URINE, 24 HOUR
Collection Interval-CRCL: 24
Creatinine Clearance: 189 — ABNORMAL HIGH
Creatinine, 24H Ur: 1499
Creatinine, Urine: 35.7
Creatinine: 0.55
Urine Total Volume-CRCL: 4200

## 2011-01-28 LAB — CBC
HCT: 30 — ABNORMAL LOW
HCT: 30.3 — ABNORMAL LOW
HCT: 32 — ABNORMAL LOW
Hemoglobin: 10.5 — ABNORMAL LOW
Hemoglobin: 10.5 — ABNORMAL LOW
Hemoglobin: 11.2 — ABNORMAL LOW
MCHC: 34.7
MCHC: 35
MCHC: 35
MCV: 89.8
MCV: 89.8
MCV: 90.8
Platelets: 210
Platelets: 223
Platelets: 232
RBC: 3.34 — ABNORMAL LOW
RBC: 3.34 — ABNORMAL LOW
RBC: 3.56 — ABNORMAL LOW
RDW: 12.4
RDW: 12.8
RDW: 12.9
WBC: 12.7 — ABNORMAL HIGH
WBC: 13.5 — ABNORMAL HIGH
WBC: 14.4 — ABNORMAL HIGH

## 2011-01-28 LAB — COMPREHENSIVE METABOLIC PANEL
ALT: 10
ALT: 9
AST: 11
AST: 16
Albumin: 2.4 — ABNORMAL LOW
Albumin: 2.6 — ABNORMAL LOW
Alkaline Phosphatase: 112
Alkaline Phosphatase: 123 — ABNORMAL HIGH
BUN: 4 — ABNORMAL LOW
BUN: 6
CO2: 23
CO2: 24
Calcium: 7.6 — ABNORMAL LOW
Calcium: 8.7
Chloride: 101
Chloride: 104
Creatinine, Ser: 0.54
Creatinine, Ser: 0.55
GFR calc Af Amer: >60
GFR calc Af Amer: >60
GFR calc non Af Amer: >60
GFR calc non Af Amer: >60
Glucose, Bld: 111 — ABNORMAL HIGH
Glucose, Bld: 75
Potassium: 3.8
Potassium: 3.9
Sodium: 132 — ABNORMAL LOW
Sodium: 135
Total Bilirubin: 0.5
Total Bilirubin: 0.6
Total Protein: 5.3 — ABNORMAL LOW
Total Protein: 6.1

## 2011-01-28 LAB — PROTEIN, URINE, 24 HOUR
Collection Interval-UPROT: 24
Protein, 24H Urine: 378 — ABNORMAL HIGH
Protein, Urine: 9
Urine Total Volume-UPROT: 4200

## 2011-01-28 LAB — RPR: RPR Ser Ql: NONREACTIVE

## 2011-01-29 LAB — CBC
HCT: 31.6 — ABNORMAL LOW
HCT: 32.8 — ABNORMAL LOW
Hemoglobin: 11.1 — ABNORMAL LOW
Hemoglobin: 11.4 — ABNORMAL LOW
MCHC: 34.8
MCHC: 35.1
MCV: 88.6
MCV: 88.8
Platelets: 209
Platelets: 234
RBC: 3.57 — ABNORMAL LOW
RBC: 3.7 — ABNORMAL LOW
RDW: 12.8
RDW: 12.8
WBC: 12.1 — ABNORMAL HIGH
WBC: 13.7 — ABNORMAL HIGH

## 2011-01-29 LAB — URINALYSIS, ROUTINE W REFLEX MICROSCOPIC
Bilirubin Urine: NEGATIVE
Glucose, UA: NEGATIVE
Ketones, ur: NEGATIVE
Nitrite: POSITIVE — AB
Protein, ur: 100 — AB
Specific Gravity, Urine: 1.02
Urobilinogen, UA: 0.2
pH: 6

## 2011-01-29 LAB — CULTURE, BLOOD (ROUTINE X 2)
Culture: NO GROWTH
Culture: NO GROWTH

## 2011-01-29 LAB — DIFFERENTIAL
Basophils Absolute: 0
Basophils Absolute: 0
Basophils Relative: 0
Basophils Relative: 0
Eosinophils Absolute: 0
Eosinophils Absolute: 0
Eosinophils Relative: 0
Eosinophils Relative: 0
Lymphocytes Relative: 4 — ABNORMAL LOW
Lymphocytes Relative: 7 — ABNORMAL LOW
Lymphs Abs: 0.6 — ABNORMAL LOW
Lymphs Abs: 0.8
Monocytes Absolute: 0.7
Monocytes Absolute: 0.8
Monocytes Relative: 5
Monocytes Relative: 6
Neutro Abs: 10.5 — ABNORMAL HIGH
Neutro Abs: 12.4 — ABNORMAL HIGH
Neutrophils Relative %: 87 — ABNORMAL HIGH
Neutrophils Relative %: 90 — ABNORMAL HIGH

## 2011-01-29 LAB — URINE MICROSCOPIC-ADD ON

## 2011-01-29 LAB — COMPREHENSIVE METABOLIC PANEL
ALT: 10
AST: 14
Albumin: 3.4 — ABNORMAL LOW
Alkaline Phosphatase: 74
BUN: 7
CO2: 24
Calcium: 9
Chloride: 108
Creatinine, Ser: 0.82
GFR calc Af Amer: >60
GFR calc non Af Amer: >60
Glucose, Bld: 92
Potassium: 3.5
Sodium: 137
Total Bilirubin: 1
Total Protein: 6.4

## 2011-01-29 LAB — URINE CULTURE: Colony Count: >=100000

## 2011-02-04 LAB — URINALYSIS, ROUTINE W REFLEX MICROSCOPIC
Glucose, UA: NEGATIVE
Hgb urine dipstick: NEGATIVE
Ketones, ur: 15 — AB
Leukocytes, UA: NEGATIVE
Nitrite: POSITIVE — AB
Protein, ur: 100 — AB
Specific Gravity, Urine: >1.03 — ABNORMAL HIGH
Urobilinogen, UA: 0.2
pH: 5.5

## 2011-02-04 LAB — URINE MICROSCOPIC-ADD ON

## 2011-02-04 LAB — POCT PREGNANCY, URINE: Preg Test, Ur: NEGATIVE

## 2011-02-06 LAB — POCT I-STAT, CHEM 8
BUN: 11
Calcium, Ion: 1.28
Chloride: 104
Creatinine, Ser: 0.8
Glucose, Bld: 90
HCT: 38
Hemoglobin: 12.9
Potassium: 4.3
Sodium: 139
TCO2: 27

## 2011-02-06 LAB — PREGNANCY, URINE: Preg Test, Ur: NEGATIVE

## 2011-02-08 LAB — WET PREP, GENITAL
Trich, Wet Prep: NONE SEEN
Yeast Wet Prep HPF POC: NONE SEEN

## 2011-02-08 LAB — GC/CHLAMYDIA PROBE AMP, GENITAL
Chlamydia, DNA Probe: NEGATIVE
GC Probe Amp, Genital: NEGATIVE

## 2011-02-08 LAB — FETAL FIBRONECTIN: Fetal Fibronectin: NEGATIVE

## 2011-02-10 ENCOUNTER — Encounter (HOSPITAL_COMMUNITY): Payer: Self-pay

## 2011-02-10 ENCOUNTER — Inpatient Hospital Stay (HOSPITAL_COMMUNITY)
Admission: AD | Admit: 2011-02-10 | Discharge: 2011-02-10 | Disposition: A | Discharge status: Home / Self Care | Payer: Medicaid Other | Source: Ambulatory Visit | Attending: Family Medicine | Admitting: Family Medicine

## 2011-02-10 DIAGNOSIS — O09899 Supervision of other high risk pregnancies, unspecified trimester: Secondary | ICD-10-CM

## 2011-02-10 DIAGNOSIS — O2390 Unspecified genitourinary tract infection in pregnancy, unspecified trimester: Secondary | ICD-10-CM | POA: Insufficient documentation

## 2011-02-10 DIAGNOSIS — O468X9 Other antepartum hemorrhage, unspecified trimester: Secondary | ICD-10-CM

## 2011-02-10 DIAGNOSIS — O418X90 Other specified disorders of amniotic fluid and membranes, unspecified trimester, not applicable or unspecified: Secondary | ICD-10-CM

## 2011-02-10 DIAGNOSIS — Z87440 Personal history of urinary (tract) infections: Secondary | ICD-10-CM

## 2011-02-10 DIAGNOSIS — Z862 Personal history of diseases of the blood and blood-forming organs and certain disorders involving the immune mechanism: Secondary | ICD-10-CM

## 2011-02-10 DIAGNOSIS — R109 Unspecified abdominal pain: Secondary | ICD-10-CM | POA: Insufficient documentation

## 2011-02-10 DIAGNOSIS — N949 Unspecified condition associated with female genital organs and menstrual cycle: Secondary | ICD-10-CM

## 2011-02-10 DIAGNOSIS — R58 Hemorrhage, not elsewhere classified: Secondary | ICD-10-CM

## 2011-02-10 DIAGNOSIS — N390 Urinary tract infection, site not specified: Secondary | ICD-10-CM | POA: Insufficient documentation

## 2011-02-10 LAB — URINALYSIS, ROUTINE W REFLEX MICROSCOPIC
Bilirubin Urine: NEGATIVE
Glucose, UA: NEGATIVE mg/dL
Ketones, ur: 15 mg/dL — AB
Nitrite: NEGATIVE
Protein, ur: 30 mg/dL — AB
Specific Gravity, Urine: >1.03 — ABNORMAL HIGH (ref 1.005–1.030)
Urobilinogen, UA: 0.2 mg/dL (ref 0.0–1.0)
pH: 6 (ref 5.0–8.0)

## 2011-02-10 LAB — URINE MICROSCOPIC-ADD ON

## 2011-02-10 MED ORDER — NITROFURANTOIN MONOHYD MACRO 100 MG PO CAPS: 100.0000 mg | ORAL_CAPSULE | Freq: Two times a day (BID) | ORAL | Status: AC
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
## 2011-02-10 NOTE — ED Provider Notes (Signed)
Chart reviewed and agree with management and plan.  

## 2011-02-10 NOTE — Progress Notes (Signed)
Patient is here with c/o pelvic pain for 3 weeks. She describes it as breaking bone sensation and could not sleep due to the discomfort. She denies any vaginal bleeding, lof.

## 2011-02-12 LAB — KLEIHAUER-BETKE STAIN
Fetal Cells %: 0
Quantitation Fetal Hemoglobin: 0

## 2011-02-12 LAB — CBC
HCT: 29.2 — ABNORMAL LOW
Hemoglobin: 10.3 — ABNORMAL LOW
MCHC: 35.2
MCV: 92.1
Platelets: 298
RBC: 3.17 — ABNORMAL LOW
RDW: 11.9
WBC: 12.3

## 2011-02-13 LAB — URINALYSIS, ROUTINE W REFLEX MICROSCOPIC
Bilirubin Urine: NEGATIVE
Glucose, UA: NEGATIVE
Hgb urine dipstick: NEGATIVE
Ketones, ur: NEGATIVE
Nitrite: NEGATIVE
Protein, ur: NEGATIVE
Specific Gravity, Urine: 1.015
Urobilinogen, UA: 0.2
pH: 6

## 2011-02-13 LAB — GC/CHLAMYDIA PROBE AMP, GENITAL
Chlamydia, DNA Probe: NEGATIVE
GC Probe Amp, Genital: NEGATIVE

## 2011-02-13 LAB — WET PREP, GENITAL
Trich, Wet Prep: NONE SEEN
Yeast Wet Prep HPF POC: NONE SEEN

## 2011-02-21 ENCOUNTER — Ambulatory Visit (INDEPENDENT_AMBULATORY_CARE_PROVIDER_SITE_OTHER): Payer: Medicaid Other | Admitting: Physician Assistant

## 2011-02-21 VITALS — BP 139/84 | Temp 97.3°F | Wt 199.8 lb

## 2011-02-21 DIAGNOSIS — N949 Unspecified condition associated with female genital organs and menstrual cycle: Secondary | ICD-10-CM

## 2011-02-21 DIAGNOSIS — O09299 Supervision of pregnancy with other poor reproductive or obstetric history, unspecified trimester: Secondary | ICD-10-CM

## 2011-02-21 DIAGNOSIS — O09899 Supervision of other high risk pregnancies, unspecified trimester: Secondary | ICD-10-CM

## 2011-02-21 LAB — POCT URINALYSIS DIP (DEVICE)
Bilirubin Urine: NEGATIVE
Glucose, UA: NEGATIVE mg/dL
Ketones, ur: NEGATIVE mg/dL
Nitrite: NEGATIVE
Protein, ur: NEGATIVE mg/dL
Specific Gravity, Urine: 1.02 (ref 1.005–1.030)
Urobilinogen, UA: 0.2 mg/dL (ref 0.0–1.0)
pH: 7 (ref 5.0–8.0)

## 2011-02-21 MED ORDER — "GARTER BELT 6"" MEDIUM-LARGE MISC": 1.0000 [IU] | Status: DC | PRN

## 2011-02-21 NOTE — Progress Notes (Signed)
Addended by: Paiden Cavell L on: 02/21/2011 11:29 AM   Modules accepted: Orders  

## 2011-02-21 NOTE — Progress Notes (Signed)
Addended by: Jill Side on: 02/21/2011 11:29 AM   Modules accepted: Orders

## 2011-02-21 NOTE — Progress Notes (Signed)
Pulse 103. Clear thick to thin vaginal discharge. Pt declined flu vaccine. Pt returned 24 hour urine. Pt needs to see nutrition.

## 2011-02-21 NOTE — Patient Instructions (Signed)

## 2011-02-21 NOTE — Progress Notes (Signed)
Increased RLP. Was seen in MAU last week for symptoms. Denies pre x s/s. Turned in 24 hour urine today. Will schedule FU [place] to re-eval spine

## 2011-02-22 LAB — COMPREHENSIVE METABOLIC PANEL
ALT: 10 U/L (ref 0–35)
AST: 14 U/L (ref 0–37)
Albumin: 3.7 g/dL (ref 3.5–5.2)
Alkaline Phosphatase: 71 U/L (ref 39–117)
BUN: 8 mg/dL (ref 6–23)
CO2: 23 mEq/L (ref 19–32)
Calcium: 9 mg/dL (ref 8.4–10.5)
Chloride: 106 mEq/L (ref 96–112)
Creat: 0.52 mg/dL (ref 0.50–1.10)
Glucose, Bld: 76 mg/dL (ref 70–99)
Potassium: 4.4 mEq/L (ref 3.5–5.3)
Sodium: 136 mEq/L (ref 135–145)
Total Bilirubin: 0.2 mg/dL — ABNORMAL LOW (ref 0.3–1.2)
Total Protein: 6.4 g/dL (ref 6.0–8.3)

## 2011-02-22 LAB — CBC
HCT: 34 % — ABNORMAL LOW (ref 36.0–46.0)
Hemoglobin: 11.1 g/dL — ABNORMAL LOW (ref 12.0–15.0)
MCH: 30.3 pg (ref 26.0–34.0)
MCHC: 32.6 g/dL (ref 30.0–36.0)
MCV: 92.9 fL (ref 78.0–100.0)
Platelets: 260 10*3/uL (ref 150–400)
RBC: 3.66 MIL/uL — ABNORMAL LOW (ref 3.87–5.11)
RDW: 12.6 % (ref 11.5–15.5)
WBC: 10.5 10*3/uL (ref 4.0–10.5)

## 2011-02-22 NOTE — Progress Notes (Signed)
Addended by: Lynnell Dike on: 02/22/2011 09:53 AM   Modules accepted: Orders

## 2011-02-23 ENCOUNTER — Encounter (HOSPITAL_COMMUNITY): Payer: Self-pay | Admitting: Obstetrics and Gynecology

## 2011-02-23 ENCOUNTER — Inpatient Hospital Stay (HOSPITAL_COMMUNITY)
Admission: AD | Admit: 2011-02-23 | Discharge: 2011-02-23 | Disposition: A | Discharge status: Home / Self Care | Payer: Medicaid Other | Source: Ambulatory Visit | Attending: Obstetrics & Gynecology | Admitting: Obstetrics & Gynecology

## 2011-02-23 DIAGNOSIS — O99891 Other specified diseases and conditions complicating pregnancy: Secondary | ICD-10-CM | POA: Insufficient documentation

## 2011-02-23 DIAGNOSIS — Z3480 Encounter for supervision of other normal pregnancy, unspecified trimester: Secondary | ICD-10-CM

## 2011-02-23 DIAGNOSIS — R209 Unspecified disturbances of skin sensation: Secondary | ICD-10-CM

## 2011-02-23 DIAGNOSIS — R200 Anesthesia of skin: Secondary | ICD-10-CM

## 2011-02-23 LAB — CREATININE CLEARANCE, URINE, 24 HOUR
Creatinine Clearance: 115 mL/min (ref 75–115)
Creatinine, 24H Ur: 862 mg/d (ref 700–1800)
Creatinine, Urine: 93.2 mg/dL
Creatinine: 0.52 mg/dL (ref 0.50–1.10)

## 2011-02-23 LAB — PROTEIN, URINE, 24 HOUR: Protein, 24H Urine: 46 mg/d — ABNORMAL LOW (ref 50–100)

## 2011-02-23 NOTE — Progress Notes (Signed)
"  I woke up from sleeping on couch about 0100 with tingling and numb on the lateral side of my LT thigh.  The sensation is sort of painful, aggravation.  Whenever something touches it that pain is a 7-8/10.  Without something touching it the pain is a 5/10."

## 2011-02-23 NOTE — Progress Notes (Signed)
Joyce Copa, CNM had to leave unit for a delivery on L&D will return to complete pt's d/c instructions.

## 2011-02-23 NOTE — ED Provider Notes (Signed)
Chief Complaint:  Numbness in left lateral thigh all day  Penny Becker is  20 y.o. [phone] at [redacted]w[redacted]d presents complaining of Numbness .  She states none contractions associated with none vaginal bleeding, intact membranes, along with active fetal movement. Describes numbness as "aggriavating and weird". No weakness in leg   Obstetrical/Gynecological History: Menstrual History: OB History    Grav Para Term Preterm Abortions TAB SAB Ect Mult Living   5 1 1  3 2 1   1       Patient's last menstrual period was 09/01/2010.     Past Medical History: Past Medical History  Diagnosis Date  . No pertinent past medical history   . Anemia     1st pregnancy, tx'd with iron  . Abnormal Pap smear   . Subchorionic hemorrhage 10/22/10  . Prior pregnancy complicated by PIH, antepartum     during 1st pregnancy  . History of recurrent UTIs last one 2 months ago    Past Surgical History: Past Surgical History  Procedure Date  . Induced abortion     2010, 2011  . No past surgeries   . Dilation and curettage of uterus     Family History: Family History  Problem Relation Age of Onset  . Schizophrenia Paternal Grandmother   . Hypertension Father   . Kidney disease Daughter     horse shoe kidneys recurrent UTI    Social History: History  Substance Use Topics  . Smoking status: Former Smoker    Quit date: 09/07/2010  . Smokeless tobacco: Not on file  . Alcohol Use: No    Allergies: No Known Allergies  Meds:  Prescriptions prior to admission  Medication Sig Dispense Refill  . Elastic Bandages & Supports (GARTER BELT 6" MEDIUM-LARGE) MISC 1 Units by Does not apply route as needed (Use as needed to relieve ligament pain).  1 each  0    Review of Systems - Please refer to the aforementioned patients' reports.     Physical Exam  Blood pressure 135/74, pulse 105, temperature 98.5 F (36.9 C), temperature source Oral, resp. rate 18, height 5' 7.5" (1.715 m), weight 89.359 kg (197  lb), last menstrual period 09/01/2010, unknown if currently breastfeeding. GENERAL: Well-developed, well-nourished female in no acute distress.  LUNGS: Clear to auscultation bilaterally.  HEART: Regular rate and rhythm. ABDOMEN: Soft, nontender, nondistended, gravid.  EXTREMITIES: Nontender, no edema, 2+ distal pulses.  Left lateral thigh has decreased sensation FHT:150's, avg LTV with reassuring accels Labs: No results found for this or any previous visit (from the past 24 hour(s)). Imaging Studies:  No results found.  Assessment: Penny Becker is  20 y.o. 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

## 2011-02-23 NOTE — Progress Notes (Signed)
Joyce Copa, CNM returned to unit to finalize d/c instructions for pt.  RN discovered pt is no longer in room.  CNM notified of pt leaving.

## 2011-02-23 NOTE — Progress Notes (Signed)
Pt states, " I have numbness in my left upper leg. It started last night, and I awoke up this morning with it and it won't go away."

## 2011-02-25 ENCOUNTER — Encounter: Payer: Self-pay | Admitting: Obstetrics & Gynecology

## 2011-02-26 ENCOUNTER — Encounter: Payer: Self-pay | Admitting: Obstetrics & Gynecology

## 2011-02-26 NOTE — ED Provider Notes (Signed)
Attestation of Attending Supervision of Advanced Practitioner: Evaluation and management procedures were performed by the PA/NP/CNM/OB Fellow under my supervision/collaboration. Chart reviewed and agree with management and plan.  Teleshia Lemere A M.D. 02/26/2011 11:19 AM   

## 2011-02-27 ENCOUNTER — Ambulatory Visit (HOSPITAL_COMMUNITY)
Admission: RE | Admit: 2011-02-27 | Discharge: 2011-02-27 | Disposition: A | Discharge status: Home / Self Care | Payer: Medicaid Other | Source: Ambulatory Visit | Attending: Physician Assistant | Admitting: Physician Assistant

## 2011-02-27 DIAGNOSIS — O09899 Supervision of other high risk pregnancies, unspecified trimester: Secondary | ICD-10-CM

## 2011-02-27 DIAGNOSIS — Z3689 Encounter for other specified antenatal screening: Secondary | ICD-10-CM | POA: Insufficient documentation

## 2011-02-27 DIAGNOSIS — O139 Gestational [pregnancy-induced] hypertension without significant proteinuria, unspecified trimester: Secondary | ICD-10-CM | POA: Insufficient documentation

## 2011-03-14 ENCOUNTER — Ambulatory Visit (INDEPENDENT_AMBULATORY_CARE_PROVIDER_SITE_OTHER): Payer: Medicaid Other | Admitting: Obstetrics & Gynecology

## 2011-03-14 ENCOUNTER — Encounter: Payer: Self-pay | Admitting: Obstetrics & Gynecology

## 2011-03-14 VITALS — BP 156/87 | Temp 97.1°F | Wt 206.1 lb

## 2011-03-14 DIAGNOSIS — Z3480 Encounter for supervision of other normal pregnancy, unspecified trimester: Secondary | ICD-10-CM

## 2011-03-14 LAB — POCT URINALYSIS DIP (DEVICE)
Bilirubin Urine: NEGATIVE
Glucose, UA: NEGATIVE mg/dL
Hgb urine dipstick: NEGATIVE
Ketones, ur: NEGATIVE mg/dL
Nitrite: NEGATIVE
Protein, ur: NEGATIVE mg/dL
Specific Gravity, Urine: 1.025 (ref 1.005–1.030)
Urobilinogen, UA: 0.2 mg/dL (ref 0.0–1.0)
pH: 7 (ref 5.0–8.0)

## 2011-03-14 LAB — CBC
HCT: 33.4 % — ABNORMAL LOW (ref 36.0–46.0)
Hemoglobin: 11.1 g/dL — ABNORMAL LOW (ref 12.0–15.0)
MCH: 30.5 pg (ref 26.0–34.0)
MCHC: 33.2 g/dL (ref 30.0–36.0)
MCV: 91.8 fL (ref 78.0–100.0)
Platelets: 247 10*3/uL (ref 150–400)
RBC: 3.64 MIL/uL — ABNORMAL LOW (ref 3.87–5.11)
RDW: 12.3 % (ref 11.5–15.5)
WBC: 11.5 10*3/uL — ABNORMAL HIGH (ref 4.0–10.5)

## 2011-03-14 MED ORDER — PANTOPRAZOLE SODIUM 40 MG PO TBEC: 40.0000 mg | DELAYED_RELEASE_TABLET | Freq: Every day | ORAL | Status: DC

## 2011-03-14 NOTE — Progress Notes (Signed)
Pain- left thigh "comes and goes a lot".  Pulse-102. BP recheck 132/82 Pt c/o nausea

## 2011-03-14 NOTE — Progress Notes (Signed)
Addended by: Adam Phenix on: 03/14/2011 12:01 PM   Modules accepted: Orders

## 2011-03-14 NOTE — Progress Notes (Signed)
States BP high when she checks at home. H/O preeclampsia Still having left lateral thigh numbness/pain. Consider chiropractic

## 2011-03-14 NOTE — Progress Notes (Signed)
Reflux sx, rx Protonix 40mg 

## 2011-03-15 LAB — GLUCOSE TOLERANCE, 1 HOUR: Glucose, 1 Hour GTT: 103 mg/dL (ref 70–140)

## 2011-03-15 LAB — RPR

## 2011-03-21 ENCOUNTER — Ambulatory Visit (INDEPENDENT_AMBULATORY_CARE_PROVIDER_SITE_OTHER): Payer: Medicaid Other | Admitting: Obstetrics and Gynecology

## 2011-03-21 ENCOUNTER — Encounter: Payer: Self-pay | Admitting: Obstetrics and Gynecology

## 2011-03-21 DIAGNOSIS — O133 Gestational [pregnancy-induced] hypertension without significant proteinuria, third trimester: Secondary | ICD-10-CM

## 2011-03-21 DIAGNOSIS — O09299 Supervision of pregnancy with other poor reproductive or obstetric history, unspecified trimester: Secondary | ICD-10-CM

## 2011-03-21 DIAGNOSIS — O131 Gestational [pregnancy-induced] hypertension without significant proteinuria, first trimester: Secondary | ICD-10-CM

## 2011-03-21 DIAGNOSIS — O09899 Supervision of other high risk pregnancies, unspecified trimester: Secondary | ICD-10-CM

## 2011-03-21 DIAGNOSIS — O139 Gestational [pregnancy-induced] hypertension without significant proteinuria, unspecified trimester: Secondary | ICD-10-CM

## 2011-03-21 DIAGNOSIS — O0990 Supervision of high risk pregnancy, unspecified, unspecified trimester: Secondary | ICD-10-CM

## 2011-03-21 DIAGNOSIS — O0992 Supervision of high risk pregnancy, unspecified, second trimester: Secondary | ICD-10-CM | POA: Insufficient documentation

## 2011-03-21 HISTORY — DX: Gestational (pregnancy-induced) hypertension without significant proteinuria, third trimester: O13.3

## 2011-03-21 LAB — POCT URINALYSIS DIP (DEVICE)
Bilirubin Urine: NEGATIVE
Glucose, UA: NEGATIVE mg/dL
Hgb urine dipstick: NEGATIVE
Ketones, ur: NEGATIVE mg/dL
Nitrite: NEGATIVE
Protein, ur: NEGATIVE mg/dL
Specific Gravity, Urine: 1.02 (ref 1.005–1.030)
Urobilinogen, UA: 0.2 mg/dL (ref 0.0–1.0)
pH: 7 (ref 5.0–8.0)

## 2011-03-21 NOTE — Patient Instructions (Signed)

## 2011-03-21 NOTE — Progress Notes (Signed)
Concerned about BPs. Third BP recheck 138/84. She has frequent stress related headaches and scotomata intermittently x weeks.  Discussed kick counts, danger signs and will get [place] later in third tri due to [facility].

## 2011-03-21 NOTE — Progress Notes (Signed)
Swelling in feet and ankles. Pelvic pressure at times. No vaginal discharge.

## 2011-03-24 ENCOUNTER — Inpatient Hospital Stay (HOSPITAL_COMMUNITY)
Admission: AD | Admit: 2011-03-24 | Discharge: 2011-03-24 | Disposition: A | Discharge status: Home / Self Care | Payer: Medicaid Other | Source: Ambulatory Visit | Attending: Obstetrics & Gynecology | Admitting: Obstetrics & Gynecology

## 2011-03-24 ENCOUNTER — Encounter (HOSPITAL_COMMUNITY): Payer: Self-pay | Admitting: Obstetrics and Gynecology

## 2011-03-24 DIAGNOSIS — O139 Gestational [pregnancy-induced] hypertension without significant proteinuria, unspecified trimester: Secondary | ICD-10-CM | POA: Insufficient documentation

## 2011-03-24 DIAGNOSIS — A499 Bacterial infection, unspecified: Secondary | ICD-10-CM | POA: Insufficient documentation

## 2011-03-24 DIAGNOSIS — B9689 Other specified bacterial agents as the cause of diseases classified elsewhere: Secondary | ICD-10-CM | POA: Insufficient documentation

## 2011-03-24 DIAGNOSIS — N760 Acute vaginitis: Secondary | ICD-10-CM | POA: Insufficient documentation

## 2011-03-24 DIAGNOSIS — O2390 Unspecified genitourinary tract infection in pregnancy, unspecified trimester: Secondary | ICD-10-CM | POA: Insufficient documentation

## 2011-03-24 HISTORY — DX: Chlamydial infection, unspecified: A74.9

## 2011-03-24 LAB — URINALYSIS, ROUTINE W REFLEX MICROSCOPIC
Bilirubin Urine: NEGATIVE
Glucose, UA: NEGATIVE mg/dL
Hgb urine dipstick: NEGATIVE
Ketones, ur: NEGATIVE mg/dL
Nitrite: NEGATIVE
Protein, ur: NEGATIVE mg/dL
Specific Gravity, Urine: 1.01 (ref 1.005–1.030)
Urobilinogen, UA: 0.2 mg/dL (ref 0.0–1.0)
pH: 6.5 (ref 5.0–8.0)

## 2011-03-24 LAB — POCT FERN TEST: Fern Test: NEGATIVE

## 2011-03-24 LAB — WET PREP, GENITAL
Trich, Wet Prep: NONE SEEN
Yeast Wet Prep HPF POC: NONE SEEN

## 2011-03-24 LAB — URINE MICROSCOPIC-ADD ON

## 2011-03-24 MED ORDER — METRONIDAZOLE 500 MG PO TABS: 500.0000 mg | ORAL_TABLET | Freq: Two times a day (BID) | ORAL | Status: DC

## 2011-03-24 MED ORDER — METRONIDAZOLE 500 MG PO TABS: 500.0000 mg | ORAL_TABLET | Freq: Two times a day (BID) | ORAL | Status: AC

## 2011-03-24 NOTE — Progress Notes (Signed)
Pt presents to MAU with chief complaint of vaginal discharge, unsure if membranes have ruptured. Pt is 29 weeks 1 day, G5P1. Pt first noticed the discharge in her underwear at 10:00 this morning. Pt noticed it 3 times today.

## 2011-03-24 NOTE — ED Provider Notes (Signed)
Penny Becker [id number] @[redacted]w[redacted]d  Chief Complaint  Patient presents with  . Vaginal Discharge    SUBJECTIVE  HPI: At 1000 today had quarter size wetness in underwear, unsure whether it was urine, vaginal discharge or amniotic fluid. Happened 3 tomes, last was 1 hr ago. Last intercourse yesterday. No gush; no irritative or malodorous vaginal discharge. PNC at [facility] for  Past Medical History  Diagnosis Date  . No pertinent past medical history   . Anemia     1st pregnancy, tx'd with iron  . Abnormal Pap smear   . Subchorionic hemorrhage 10/22/10  . Prior pregnancy complicated by PIH, antepartum     during 1st pregnancy  . History of recurrent UTIs last one 2 months ago  . Chlamydia    Past Surgical History  Procedure Date  . Induced abortion     2010, 2011  . No past surgeries   . Dilation and curettage of uterus    History   Social History  . Marital Status: Single    Spouse Name: N/A    Number of Children: N/A  . Years of Education: N/A   Occupational History  . Not on file.   Social History Main Topics  . Smoking status: Former Smoker    Quit date: 09/07/2010  . Smokeless tobacco: Not on file  . Alcohol Use: No  . Drug Use: No  . Sexually Active: Yes   Other Topics Concern  . Not on file   Social History Narrative  . No narrative on file   No current facility-administered medications on file prior to encounter.   No current outpatient prescriptions on file prior to encounter.   No Known Allergies  ROS: Pertinent items in HPI  OBJECTIVE  BP 160/74  Pulse 89  Temp(Src) 98.9 F (37.2 C) (Oral)  Resp 16  Ht 5' 7.5" (1.715 m)  Wt 94.892 kg (209 lb 3.2 oz)  BMI 32.28 kg/m2  LMP 09/01/2010  Physical Exam  Constitutional: She is oriented to person, place, and time and well-developed, well-nourished, and in no distress. No distress.  HENT:  Head: Normocephalic.  Neck: Neck supple.  Pulmonary/Chest: Effort normal.  Abdominal: Soft. There is no  tenderness.  Genitourinary: Vagina normal and cervix normal. No vaginal discharge found.       SSE: physiologic white discharge. Neg pool . Neg fern. No blood.  Musculoskeletal: Normal range of motion.  Neurological: She is oriented to person, place, and time.  Skin: Skin is warm and dry.  Psychiatric: Affect normal.    Toco: No UCs FHR 140-145 reactive Results for orders placed during the hospital encounter of 03/24/11 (from the past 24 hour(s))  URINALYSIS, ROUTINE W REFLEX MICROSCOPIC     Status: Abnormal   Collection Time   03/24/11  1:46 PM      Component Value Range   Color, Urine YELLOW  YELLOW    Appearance CLEAR  CLEAR    Specific Gravity, Urine 1.010  1.005 - 1.030    pH 6.5  5.0 - 8.0    Glucose, UA NEGATIVE  NEGATIVE (mg/dL)   Hgb urine dipstick NEGATIVE  NEGATIVE    Bilirubin Urine NEGATIVE  NEGATIVE    Ketones, ur NEGATIVE  NEGATIVE (mg/dL)   Protein, ur NEGATIVE  NEGATIVE (mg/dL)   Urobilinogen, UA 0.2  0.0 - 1.0 (mg/dL)   Nitrite NEGATIVE  NEGATIVE    Leukocytes, UA TRACE (*) NEGATIVE   URINE MICROSCOPIC-ADD ON     Status: Normal  Collection Time   03/24/11  1:46 PM      Component Value Range   Squamous Epithelial / LPF RARE  RARE    WBC, UA 0-2  <3 (WBC/hpf)   Bacteria, UA RARE  RARE   WET PREP, GENITAL     Status: Abnormal   Collection Time   03/24/11  2:35 PM      Component Value Range   Yeast, Wet Prep NONE SEEN  NONE SEEN    Trich, Wet Prep NONE SEEN  NONE SEEN    Clue Cells, Wet Prep FEW (*) NONE SEEN    WBC, Wet Prep HPF POC FEW (*) NONE SEEN   POCT FERN TEST     Status: Normal   Collection Time   03/24/11  2:41 PM      Component Value Range   Fern Test Negative     Rpt BPs 134/83, 129/76,129/74  ASSESSMENT  P1 at [redacted]w[redacted]d without evidence of SROM or vaginitis BV Gestational vs CHTN  PLAN  Flagyl RX

## 2011-03-24 NOTE — Progress Notes (Signed)
When woke up this morning had wet spot did not have an odor, then started leaking a little bit of fluid a couple of times, having lower abdominal pain.

## 2011-03-24 NOTE — ED Provider Notes (Signed)
Procedure: Newborn Female Circumcision using a Gomco  Indication: Parental request  EBL: Minimal  Complications: None immediate  Anesthesia: 1% lidocaine local, Tylenol  Procedure in detail:  A dorsal penile nerve block was performed with 1% lidocaine.  The area was then cleaned with betadine and draped in sterile fashion.  Two hemostats are applied at the 3 o'clock and 9 o'clock positions on the foreskin.  While maintaining traction, a third hemostat was used to sweep around the glans the release adhesions between the glans and the inner layer of mucosa avoiding the 5 o'clock and 7 o'clock positions.  The Mogen clamp was placed across the penis above the glans.  The foreskin was cut.  After adequate time to assure hemostasis, the Mogen clamp was removed.  The foreskin was separated and the glans was noted to be unharmed with good hemostasis and cosmetic result.  A 6.5 inch of gelfoam was then applied to the cut edge of the foreskin.     

## 2011-04-04 ENCOUNTER — Other Ambulatory Visit: Payer: Self-pay | Admitting: Obstetrics and Gynecology

## 2011-04-04 ENCOUNTER — Ambulatory Visit (INDEPENDENT_AMBULATORY_CARE_PROVIDER_SITE_OTHER): Payer: Medicaid Other | Admitting: Physician Assistant

## 2011-04-04 DIAGNOSIS — O139 Gestational [pregnancy-induced] hypertension without significant proteinuria, unspecified trimester: Secondary | ICD-10-CM

## 2011-04-04 DIAGNOSIS — O169 Unspecified maternal hypertension, unspecified trimester: Secondary | ICD-10-CM

## 2011-04-04 DIAGNOSIS — O0990 Supervision of high risk pregnancy, unspecified, unspecified trimester: Secondary | ICD-10-CM

## 2011-04-04 LAB — POCT URINALYSIS DIP (DEVICE)
Bilirubin Urine: NEGATIVE
Glucose, UA: NEGATIVE mg/dL
Hgb urine dipstick: NEGATIVE
Ketones, ur: NEGATIVE mg/dL
Nitrite: NEGATIVE
Protein, ur: NEGATIVE mg/dL
Specific Gravity, Urine: 1.015 (ref 1.005–1.030)
Urobilinogen, UA: 0.2 mg/dL (ref 0.0–1.0)
pH: 8.5 — ABNORMAL HIGH (ref 5.0–8.0)

## 2011-04-04 NOTE — Progress Notes (Signed)
Pt has some pain and pressure.

## 2011-04-04 NOTE — Patient Instructions (Signed)

## 2011-04-04 NOTE — Progress Notes (Signed)
C/o insomnia. Discuss bedtime routine and decreasing caffeine, none 3-4 hours before going to bed. Pre-x precautions.

## 2011-04-16 NOTE — Progress Notes (Addendum)
04/16/11   Pt did not return to clinic to obtain Rx for Elastic Bandage and support (garter belt).  Rx destroyed today.

## 2011-04-18 ENCOUNTER — Encounter (HOSPITAL_COMMUNITY): Payer: Self-pay | Admitting: *Deleted

## 2011-04-18 ENCOUNTER — Inpatient Hospital Stay (HOSPITAL_COMMUNITY)
Admission: AD | Admit: 2011-04-18 | Discharge: 2011-04-18 | Discharge status: Against Medical Advice | Payer: Medicaid Other | Source: Ambulatory Visit | Attending: Obstetrics & Gynecology | Admitting: Obstetrics & Gynecology

## 2011-04-18 DIAGNOSIS — O26899 Other specified pregnancy related conditions, unspecified trimester: Secondary | ICD-10-CM

## 2011-04-18 DIAGNOSIS — O99891 Other specified diseases and conditions complicating pregnancy: Secondary | ICD-10-CM | POA: Insufficient documentation

## 2011-04-18 DIAGNOSIS — N898 Other specified noninflammatory disorders of vagina: Secondary | ICD-10-CM | POA: Insufficient documentation

## 2011-04-18 LAB — WET PREP, GENITAL
Trich, Wet Prep: NONE SEEN
Yeast Wet Prep HPF POC: NONE SEEN

## 2011-04-18 LAB — FETAL FIBRONECTIN: Fetal Fibronectin: NEGATIVE

## 2011-04-18 NOTE — ED Notes (Signed)
Pt left AMA after exam and disconnecting monitors prior to results or discharge instructions.  L.Cabella Kimm, RN

## 2011-04-18 NOTE — Progress Notes (Signed)
PT SAYS LAST NIGHT   AT  8PM -  FELT FLUID GO DOWN LEG - NO ODOR.   PAD ON NOW- SAYS NOTHING ON PAD NOW.   HAD BLOODY D/C AT 12 NOON - CALLED CLINIC-  TOLD HER TO GO TO HOSPITAL.  DENIES ANY BLEEDING OR FLUID NOW.   LAST SEX  04-16-2011.   SAYS CRAMPS ALL TIME-  LAST TIME HERE- CERVIX WAS CLOSED

## 2011-04-18 NOTE — ED Provider Notes (Signed)
History    Vaginal Discharge  HPI  Patient presents to MAU with vaginal discharge, she is concerned about ROM.  Denies any gush of fluid, but does complain of white, milky discharge that is heavier than usual.  Yesterday, patient states that discharge leaked down her leg.  Denies any vaginal bleeding, itching, or burning with urination.  Endorses good fetal movement and Braxton Hicks contractions.  Denies any fever, chills, NS, nausea or vomiting.  OB History    Grav Para Term Preterm Abortions TAB SAB Ect Mult Living   5 1 1  0 3 2 1  0 0 1      Past Medical History  Diagnosis Date  . No pertinent past medical history   . Anemia     1st pregnancy, tx'd with iron  . Abnormal Pap smear   . Subchorionic hemorrhage 10/22/10  . Prior pregnancy complicated by PIH, antepartum     during 1st pregnancy  . History of recurrent UTIs last one 2 months ago  . Chlamydia     Past Surgical History  Procedure Date  . Induced abortion     2010, 2011  . No past surgeries   . Dilation and curettage of uterus     Family History  Problem Relation Age of Onset  . Schizophrenia Paternal Grandmother   . Hypertension Father   . Kidney disease Daughter     horse shoe kidneys recurrent UTI  . Anesthesia problems Neg Hx     History  Substance Use Topics  . Smoking status: Former Smoker -- 1.0 packs/day    Quit date: 09/07/2010  . Smokeless tobacco: Never Used  . Alcohol Use: No    Allergies: No Known Allergies  Prescriptions prior to admission  Medication Sig Dispense Refill  . acetaminophen (TYLENOL) 325 MG tablet Take 650 mg by mouth every 6 (six) hours as needed. pain         ROS  Per HPI  Physical Exam   Blood pressure 137/69, pulse 78, temperature 98.4 F (36.9 C), temperature source Oral, resp. rate 20, height 5\' 6"  (1.676 m), weight 8.278 kg (18 lb 4 oz), last menstrual period 09/01/2010.  Physical Exam  Constitutional: No distress.  HENT:  Mouth/Throat: Oropharynx is  clear and moist.  Neck: Normal range of motion. Neck supple.  Respiratory: Effort normal.  GI:       Gravid  Genitourinary: There is no rash, tenderness or lesion on the right labia. There is no rash, tenderness or lesion on the left labia. Cervix exhibits discharge. Cervix exhibits no motion tenderness and no friability. Right adnexum displays no mass, no tenderness and no fullness. Left adnexum displays no mass, no tenderness and no fullness. No erythema, tenderness or bleeding around the vagina. Vaginal discharge found.  SSE: physiologic white discharge. Neg pool . Neg fern. No blood.  Dilation: Closed Effacement (%): Thick Exam by:: Dr. Barnabas Lister     MAU Course  Procedures NST, wet prep, GC/Chlamydia, FFN  Assessment and Plan  1) Vaginal discharge: will order wet prep, GC/chlamydia, and FFN.  No pooling so will not perform fern test.  Patient left AMA without signing papers.   2) No evidence of SROM or pooling or bleeding 3) Reassuring FHT 4) Disposition: Patient did not want to wait for results, left MAU without telling staff or provider, did not sign AMA forms.  DE LA CRUZ,IVY 04/18/2011, 9:01 PM   Agree with above. Did not see pt. Bradi Arbuthnot 04/18/2011 11:12  PM

## 2011-04-19 LAB — GC/CHLAMYDIA PROBE AMP, GENITAL
Chlamydia, DNA Probe: NEGATIVE
GC Probe Amp, Genital: NEGATIVE

## 2011-04-24 ENCOUNTER — Encounter (HOSPITAL_COMMUNITY): Payer: Self-pay | Admitting: *Deleted

## 2011-04-24 ENCOUNTER — Inpatient Hospital Stay (HOSPITAL_COMMUNITY)
Admission: AD | Admit: 2011-04-24 | Discharge: 2011-04-24 | Disposition: A | Discharge status: Home / Self Care | Payer: Medicaid Other | Source: Ambulatory Visit | Attending: Obstetrics & Gynecology | Admitting: Obstetrics & Gynecology

## 2011-04-24 DIAGNOSIS — O99891 Other specified diseases and conditions complicating pregnancy: Secondary | ICD-10-CM | POA: Insufficient documentation

## 2011-04-24 DIAGNOSIS — R109 Unspecified abdominal pain: Secondary | ICD-10-CM | POA: Insufficient documentation

## 2011-04-24 NOTE — ED Provider Notes (Signed)
History     Chief Complaint  Patient presents with  . Abdominal Pain  . Diarrhea   HPI Penny Becker 20 y.o. female  [phone] at [redacted]w[redacted]d presenting with abdominal pain and one loose stool.   Patient states does not know what contractions feel like because previous delivery was an induction and had early epidural. She says she was having diffuse crampy abdominal pain every 15-30 minutes lasting 30 seconds to 1 minute at home since about 10pm last night. . Also had 1 loose stool at 1 AM. She says since she got here an hour ago, her pain has completely subsided.   Patient denies decreased fetal movement/abnormal discharge/blood from vagina/rush of fluid.     OB History    Grav Para Term Preterm Abortions TAB SAB Ect Mult Living   5 1 1  0 3 2 1  0 0 1      Past Medical History  Diagnosis Date  . No pertinent past medical history   . Anemia     1st pregnancy, tx'd with iron  . Abnormal Pap smear   . Subchorionic hemorrhage 10/22/10  . Prior pregnancy complicated by PIH, antepartum     during 1st pregnancy  . History of recurrent UTIs last one 2 months ago  . Chlamydia     Past Surgical History  Procedure Date  . Induced abortion     2010, 2011  . No past surgeries   . Dilation and curettage of uterus     Family History  Problem Relation Age of Onset  . Schizophrenia Paternal Grandmother   . Hypertension Father   . Kidney disease Daughter     horse shoe kidneys recurrent UTI  . Anesthesia problems Neg Hx     History  Substance Use Topics  . Smoking status: Former Smoker -- 1.0 packs/day    Quit date: 09/07/2010  . Smokeless tobacco: Never Used  . Alcohol Use: No    Allergies: No Known Allergies  Prescriptions prior to admission  Medication Sig Dispense Refill  . acetaminophen (TYLENOL) 325 MG tablet Take 650 mg by mouth every 6 (six) hours as needed. pain         ROS negative except as noted in HPI   Physical Exam   Blood pressure 137/86, pulse 121,  temperature 98.4 F (36.9 C), temperature source Oral, resp. rate 18, height 5' 7.5" (1.715 m), weight 97.523 kg (215 lb), last menstrual period 09/01/2010, SpO2 97.00%.  Physical Exam  Constitutional: She is oriented to person, place, and time. She appears well-developed and well-nourished. No distress.       MMM. <2 second capillary refill-appears well hydrated.    Cardiovascular: Normal rate and regular rhythm.  Exam reveals no gallop and no friction rub.   No murmur heard. Respiratory: Effort normal and breath sounds normal. No respiratory distress. She has no wheezes. She has no rales. She exhibits no tenderness.  GI:       Gravid. Size consistent with dates.   Genitourinary: Vagina normal and uterus normal. No vaginal discharge found.  Musculoskeletal: Normal range of motion. She exhibits no edema.  Neurological: She is alert and oriented to person, place, and time.    Dilation: 1 Effacement (%): 30 Station: -2 Exam by:: Hunter MD  FHT-150 baseline. Moderate variability. Reactive strip-accels present. No decels.  MAU Course  Procedures  MDM No contractions on monitor. Assessed cervix to make sure patient not dilated.  Had a negative FFN on 12/15. Also unremarkable wet  prep/gc/chlamydia at that time so will not reevaluate.   Assessment and Plan  #1  G5P1031 at [redacted]w[redacted]d #2 possible false labor #3 1 loose stool without dehydration.   Discharge home. Labor precautions reviewed. Told patient to stay well hydrated. Patient to follow up as scheduled with high risk clinic.   Case discussed with Philipp Deputy, CNM HUNTER, STEPHEN 04/24/2011, 5:33 AM

## 2011-04-24 NOTE — Progress Notes (Signed)
Patient states she has been having abdominal pains on and off since 2130 last night. She had diarrhea earlier x1

## 2011-04-25 ENCOUNTER — Ambulatory Visit (INDEPENDENT_AMBULATORY_CARE_PROVIDER_SITE_OTHER): Payer: Medicaid Other | Admitting: Family

## 2011-04-25 ENCOUNTER — Inpatient Hospital Stay (HOSPITAL_COMMUNITY)
Admission: AD | Admit: 2011-04-25 | Discharge: 2011-04-25 | Disposition: A | Discharge status: Home / Self Care | Payer: Medicaid Other | Source: Ambulatory Visit | Attending: Obstetrics & Gynecology | Admitting: Obstetrics & Gynecology

## 2011-04-25 ENCOUNTER — Encounter: Payer: Self-pay | Admitting: Family

## 2011-04-25 ENCOUNTER — Encounter (HOSPITAL_COMMUNITY): Payer: Self-pay | Admitting: Obstetrics and Gynecology

## 2011-04-25 ENCOUNTER — Inpatient Hospital Stay (HOSPITAL_COMMUNITY): Payer: Medicaid Other

## 2011-04-25 VITALS — BP 145/93 | HR 107 | Temp 97.4°F | Wt 212.2 lb

## 2011-04-25 DIAGNOSIS — O26899 Other specified pregnancy related conditions, unspecified trimester: Secondary | ICD-10-CM

## 2011-04-25 DIAGNOSIS — O9989 Other specified diseases and conditions complicating pregnancy, childbirth and the puerperium: Secondary | ICD-10-CM

## 2011-04-25 DIAGNOSIS — IMO0002 Reserved for concepts with insufficient information to code with codable children: Secondary | ICD-10-CM

## 2011-04-25 DIAGNOSIS — O169 Unspecified maternal hypertension, unspecified trimester: Secondary | ICD-10-CM

## 2011-04-25 DIAGNOSIS — O139 Gestational [pregnancy-induced] hypertension without significant proteinuria, unspecified trimester: Secondary | ICD-10-CM | POA: Insufficient documentation

## 2011-04-25 DIAGNOSIS — R300 Dysuria: Secondary | ICD-10-CM

## 2011-04-25 LAB — POCT URINALYSIS DIP (DEVICE)
Glucose, UA: NEGATIVE mg/dL
Hgb urine dipstick: NEGATIVE
Nitrite: NEGATIVE
Protein, ur: 30 mg/dL — AB
Specific Gravity, Urine: >=1.03 (ref 1.005–1.030)
Urobilinogen, UA: 2 mg/dL — ABNORMAL HIGH (ref 0.0–1.0)
pH: 5.5 (ref 5.0–8.0)

## 2011-04-25 LAB — COMPREHENSIVE METABOLIC PANEL
ALT: 10 U/L (ref 0–35)
AST: 20 U/L (ref 0–37)
Albumin: 2.5 g/dL — ABNORMAL LOW (ref 3.5–5.2)
Alkaline Phosphatase: 125 U/L — ABNORMAL HIGH (ref 39–117)
BUN: 9 mg/dL (ref 6–23)
CO2: 24 mEq/L (ref 19–32)
Calcium: 8.7 mg/dL (ref 8.4–10.5)
Chloride: 101 mEq/L (ref 96–112)
Creatinine, Ser: 0.62 mg/dL (ref 0.50–1.10)
GFR calc Af Amer: >90 mL/min (ref >90)
GFR calc non Af Amer: >90 mL/min (ref >90)
Glucose, Bld: 83 mg/dL (ref 70–99)
Potassium: 4.2 mEq/L (ref 3.5–5.1)
Sodium: 135 mEq/L (ref 135–145)
Total Bilirubin: 0.2 mg/dL — ABNORMAL LOW (ref 0.3–1.2)
Total Protein: 6.3 g/dL (ref 6.0–8.3)

## 2011-04-25 LAB — CBC
HCT: 34.1 % — ABNORMAL LOW (ref 36.0–46.0)
Hemoglobin: 11.4 g/dL — ABNORMAL LOW (ref 12.0–15.0)
MCH: 30.1 pg (ref 26.0–34.0)
MCHC: 33.4 g/dL (ref 30.0–36.0)
MCV: 90 fL (ref 78.0–100.0)
Platelets: 216 10*3/uL (ref 150–400)
RBC: 3.79 MIL/uL — ABNORMAL LOW (ref 3.87–5.11)
RDW: 12.6 % (ref 11.5–15.5)
WBC: 10.9 10*3/uL — ABNORMAL HIGH (ref 4.0–10.5)

## 2011-04-25 MED ORDER — NITROFURANTOIN MONOHYD MACRO 100 MG PO CAPS: 100.0000 mg | ORAL_CAPSULE | Freq: Two times a day (BID) | ORAL | Status: DC

## 2011-04-25 NOTE — Progress Notes (Signed)
Pt denies PIH symptoms; +UTI symptoms; RX macrobid with urine culture; due to elevated blood pressure to MAU for labs and BPP.

## 2011-04-25 NOTE — Progress Notes (Signed)
Intermittent pain on lower back and across abdomen. Pressure in vaginal area. Vaginal D/C thin watery white for a couple times the past 2 weeks.

## 2011-04-25 NOTE — Progress Notes (Signed)
Sent from clinic for PIH eval, NST and BPP.  Pt taken directly to rm.

## 2011-04-25 NOTE — Progress Notes (Signed)
Addended by: Melissa Noon on: 04/25/2011 12:17 PM   Modules accepted: Orders

## 2011-04-25 NOTE — ED Provider Notes (Signed)
Daneil Dolin [id number] @[redacted]w[redacted]d  Chief Complaint  Patient presents with  . Hypertension    SUBJECTIVE  HPI: Sent from routine visit at high-risk clinic today due to blood pressure elevation of 149/93 and new onset dipstick proteinuria of 30 on concentrated specimen with mild ketonuria. She attends high-risk clinic due to history of preeclampsia necessitating induction of labor at 37 weeks with her first pregnancy. She had baseline 24-hour urine in October with 46 mg protein and her baseline blood pressure this pregnancy was 134/64. She denies headache, visual disturbances, epigastric pain, contractions, leakage of fluid, vaginal bleeding. She reports good fetal movement. Last [place] at 25 wks: nl.   Past Medical History  Diagnosis Date  . No pertinent past medical history   . Anemia     1st pregnancy, tx'd with iron  . Abnormal Pap smear   . Subchorionic hemorrhage 10/22/10  . Prior pregnancy complicated by PIH, antepartum     during 1st pregnancy  . History of recurrent UTIs last one 2 months ago  . Chlamydia    Past Surgical History  Procedure Date  . Induced abortion     2010, 2011  . No past surgeries   . Dilation and curettage of uterus    History   Social History  . Marital Status: Single    Spouse Name: N/A    Number of Children: N/A  . Years of Education: N/A   Occupational History  . Not on file.   Social History Main Topics  . Smoking status: Former Smoker -- 1.0 packs/day    Quit date: 09/07/2010  . Smokeless tobacco: Never Used  . Alcohol Use: No  . Drug Use: No  . Sexually Active: Yes    Birth Control/ Protection: None   Other Topics Concern  . Not on file   Social History Narrative  . No narrative on file   No current facility-administered medications on file prior to encounter.   Current Outpatient Prescriptions on File Prior to Encounter  Medication Sig Dispense Refill  . nitrofurantoin, macrocrystal-monohydrate, (MACROBID) 100 MG capsule Take  1 capsule (100 mg total) by mouth 2 (two) times daily.  14 capsule  0   No Known Allergies  ROS: Pertinent items in HPI  OBJECTIVE  BP 122/74  Pulse 93  LMP 09/01/2010 BP range: 122-145/74-93  BPP 8/8  NST: baseline 140, reactive Toco: no UCs  Physical Exam  Constitutional: She is oriented to person, place, and time and well-developed, well-nourished, and in no distress.  HENT:  Head: Normocephalic.  Eyes: EOM are normal. Pupils are equal, round, and reactive to light.  Neck: Neck supple.  Cardiovascular: Normal rate.   Pulmonary/Chest: Effort normal.  Abdominal: Soft. There is no tenderness.       S=D  Musculoskeletal: Normal range of motion.  Neurological: She is alert and oriented to person, place, and time. She displays normal reflexes.  Skin: Skin is warm and dry.  Psychiatric: Affect normal.   Results for orders placed during the hospital encounter of 04/25/11 (from the past 24 hour(s))  CBC     Status: Abnormal   Collection Time   04/25/11  1:29 PM      Component Value Range   WBC 10.9 (*) 4.0 - 10.5 (K/uL)   RBC 3.79 (*) 3.87 - 5.11 (MIL/uL)   Hemoglobin 11.4 (*) 12.0 - 15.0 (g/dL)   HCT 95.6 (*) 21.3 - 46.0 (%)   MCV 90.0  78.0 - 100.0 (fL)   MCH 30.1  26.0 - 34.0 (pg)   MCHC 33.4  30.0 - 36.0 (g/dL)   RDW 16.1  09.6 - 04.5 (%)   Platelets 216  150 - 400 (K/uL)  COMPREHENSIVE METABOLIC PANEL     Status: Abnormal   Collection Time   04/25/11  1:29 PM      Component Value Range   Sodium 135  135 - 145 (mEq/L)   Potassium 4.2  3.5 - 5.1 (mEq/L)   Chloride 101  96 - 112 (mEq/L)   CO2 24  19 - 32 (mEq/L)   Glucose, Bld 83  70 - 99 (mg/dL)   BUN 9  6 - 23 (mg/dL)   Creatinine, Ser 4.09  0.50 - 1.10 (mg/dL)   Calcium 8.7  8.4 - 81.1 (mg/dL)   Total Protein 6.3  6.0 - 8.3 (g/dL)   Albumin 2.5 (*) 3.5 - 5.2 (g/dL)   AST 20  0 - 37 (U/L)   ALT 10  0 - 35 (U/L)   Alkaline Phosphatase 125 (*) 39 - 117 (U/L)   Total Bilirubin 0.2 (*) 0.3 - 1.2 (mg/dL)    GFR calc non Af Amer >90  >90 (mL/min)   GFR calc Af Amer >90  >90 (mL/min)    ASSESSMENT P1 at [redacted]w[redacted]d Hypertensive disorder of pregnancy: GHTN vs mild preE vs new CHTN ASB      PLAN Home with preeclampsia precautions. Urged to increase rest. Return to clinic in 4 days with 24-hour urine specimen, blood pressure check, ultrasound for growth. MCN RX given in clinic.   Will review with Dr. Penne Lash (in OR now)

## 2011-04-27 LAB — CULTURE, OB URINE: Colony Count: 8000

## 2011-04-29 ENCOUNTER — Inpatient Hospital Stay (HOSPITAL_COMMUNITY)
Admission: AD | Admit: 2011-04-29 | Discharge: 2011-04-29 | Disposition: A | Discharge status: Home / Self Care | Payer: Medicaid Other | Source: Ambulatory Visit | Attending: Obstetrics and Gynecology | Admitting: Obstetrics and Gynecology

## 2011-04-29 ENCOUNTER — Inpatient Hospital Stay (HOSPITAL_COMMUNITY): Payer: Medicaid Other

## 2011-04-29 ENCOUNTER — Other Ambulatory Visit: Payer: Self-pay | Admitting: Physician Assistant

## 2011-04-29 ENCOUNTER — Encounter (HOSPITAL_COMMUNITY): Payer: Self-pay | Admitting: *Deleted

## 2011-04-29 DIAGNOSIS — O09299 Supervision of pregnancy with other poor reproductive or obstetric history, unspecified trimester: Secondary | ICD-10-CM

## 2011-04-29 DIAGNOSIS — O09899 Supervision of other high risk pregnancies, unspecified trimester: Secondary | ICD-10-CM

## 2011-04-29 DIAGNOSIS — IMO0002 Reserved for concepts with insufficient information to code with codable children: Secondary | ICD-10-CM

## 2011-04-29 DIAGNOSIS — O139 Gestational [pregnancy-induced] hypertension without significant proteinuria, unspecified trimester: Secondary | ICD-10-CM | POA: Insufficient documentation

## 2011-04-29 LAB — COMPREHENSIVE METABOLIC PANEL
ALT: 13 U/L (ref 0–35)
AST: 15 U/L (ref 0–37)
Albumin: 2.4 g/dL — ABNORMAL LOW (ref 3.5–5.2)
Alkaline Phosphatase: 139 U/L — ABNORMAL HIGH (ref 39–117)
BUN: 7 mg/dL (ref 6–23)
CO2: 20 mEq/L (ref 19–32)
Calcium: 8.5 mg/dL (ref 8.4–10.5)
Chloride: 105 mEq/L (ref 96–112)
Creatinine, Ser: 0.59 mg/dL (ref 0.50–1.10)
GFR calc Af Amer: >90 mL/min (ref >90)
GFR calc non Af Amer: >90 mL/min (ref >90)
Glucose, Bld: 110 mg/dL — ABNORMAL HIGH (ref 70–99)
Potassium: 3.7 mEq/L (ref 3.5–5.1)
Sodium: 135 mEq/L (ref 135–145)
Total Bilirubin: 0.3 mg/dL (ref 0.3–1.2)
Total Protein: 6.3 g/dL (ref 6.0–8.3)

## 2011-04-29 LAB — CREATININE CLEARANCE, URINE, 24 HOUR
Collection Interval-CRCL: 24 hours
Creatinine Clearance: 181 mL/min — ABNORMAL HIGH (ref 75–115)
Creatinine, 24H Ur: 1540 mg/d (ref 700–1800)
Creatinine, Urine: 192.45 mg/dL
Creatinine: 0.59 mg/dL (ref 0.50–1.10)
Urine Total Volume-CRCL: 800 mL

## 2011-04-29 LAB — CBC
HCT: 34.3 % — ABNORMAL LOW (ref 36.0–46.0)
Hemoglobin: 11.6 g/dL — ABNORMAL LOW (ref 12.0–15.0)
MCH: 29.8 pg (ref 26.0–34.0)
MCHC: 33.8 g/dL (ref 30.0–36.0)
MCV: 88.2 fL (ref 78.0–100.0)
Platelets: 220 10*3/uL (ref 150–400)
RBC: 3.89 MIL/uL (ref 3.87–5.11)
RDW: 12.4 % (ref 11.5–15.5)
WBC: 10.2 10*3/uL (ref 4.0–10.5)

## 2011-04-29 NOTE — ED Provider Notes (Signed)
Chief Complaint:  Hypertension   Penny Becker is  20 y.o. [phone].  Patient's last menstrual period was 09/01/2010..  [redacted]w[redacted]d   She presents complaining of Hypertension  Presents for BP check, labs, ultrasound and to drop off 24 hour urine. Presents to MAU due to clinic being closed for the holidays. Denies HA, visual disturbance, epigastric pain, and contractions. States good fetal movement daily.  Obstetrical/Gynecological History: OB History    Grav Para Term Preterm Abortions TAB SAB Ect Mult Living   5 1 1  0 3 2 1  0 0 1      Past Medical History: Past Medical History  Diagnosis Date  . No pertinent past medical history   . Anemia     1st pregnancy, tx'd with iron  . Abnormal Pap smear   . Subchorionic hemorrhage 10/22/10  . Prior pregnancy complicated by PIH, antepartum     during 1st pregnancy  . History of recurrent UTIs last one 2 months ago  . Chlamydia     Past Surgical History: Past Surgical History  Procedure Date  . Induced abortion     2010, 2011  . Dilation and curettage of uterus     Family History: Family History  Problem Relation Age of Onset  . Schizophrenia Paternal Grandmother   . Hypertension Father   . Kidney disease Daughter     horse shoe kidneys recurrent UTI  . Anesthesia problems Neg Hx     Social History: History  Substance Use Topics  . Smoking status: Former Smoker -- 1.0 packs/day    Quit date: 09/07/2010  . Smokeless tobacco: Never Used  . Alcohol Use: No    Allergies: No Known Allergies  Prescriptions prior to admission  Medication Sig Dispense Refill  . nitrofurantoin, macrocrystal-monohydrate, (MACROBID) 100 MG capsule Take 100 mg by mouth 2 (two) times daily. Pt has not started yet       . DISCONTD: nitrofurantoin, macrocrystal-monohydrate, (MACROBID) 100 MG capsule Take 1 capsule (100 mg total) by mouth 2 (two) times daily.  14 capsule  0    Review of Systems - Negative except what has been reviewed in HPI  Physical  Exam   Blood pressure 134/84, pulse 111, temperature 98.6 F (37 C), temperature source Oral, resp. rate 20, height 5' 7.5" (1.715 m), weight 215 lb (97.523 kg), last menstrual period 09/01/2010, SpO2 98.00%, not currently breastfeeding.  General: General appearance - alert, well appearing, and in no distress, oriented to person, place, and time and overweight Mental status - alert, oriented to person, place, and time, normal mood, behavior, speech, dress, motor activity, and thought processes, affect appropriate to mood Abdomen - gravid, non tender Focused Gynecological Exam: examination not indicated, FHR: 140 mod variability, + accels, no decels, reactive strip. Cat I  Labs: Recent Results (from the past 24 hour(s))  CBC   Collection Time   04/29/11 10:08 AM      Component Value Range   WBC 10.2  4.0 - 10.5 (K/uL)   RBC 3.89  3.87 - 5.11 (MIL/uL)   Hemoglobin 11.6 (*) 12.0 - 15.0 (g/dL)   HCT 53.6 (*) 64.4 - 46.0 (%)   MCV 88.2  78.0 - 100.0 (fL)   MCH 29.8  26.0 - 34.0 (pg)   MCHC 33.8  30.0 - 36.0 (g/dL)   RDW 03.4  74.2 - 59.5 (%)   Platelets 220  150 - 400 (K/uL)  COMPREHENSIVE METABOLIC PANEL   Collection Time   04/29/11 10:08 AM  Component Value Range   Sodium 135  135 - 145 (mEq/L)   Potassium 3.7  3.5 - 5.1 (mEq/L)   Chloride 105  96 - 112 (mEq/L)   CO2 20  19 - 32 (mEq/L)   Glucose, Bld 110 (*) 70 - 99 (mg/dL)   BUN 7  6 - 23 (mg/dL)   Creatinine, Ser 5.28  0.50 - 1.10 (mg/dL)   Calcium 8.5  8.4 - 41.3 (mg/dL)   Total Protein 6.3  6.0 - 8.3 (g/dL)   Albumin 2.4 (*) 3.5 - 5.2 (g/dL)   AST 15  0 - 37 (U/L)   ALT 13  0 - 35 (U/L)   Alkaline Phosphatase 139 (*) 39 - 117 (U/L)   Total Bilirubin 0.3  0.3 - 1.2 (mg/dL)   GFR calc non Af Amer >90  >90 (mL/min)   GFR calc Af Amer >90  >90 (mL/min)   Imaging Studies:  US Ob Follow Up  04/29/2011  OBSTETRICAL ULTRASOUND: This exam was performed within a [facility]. The OB US report was  generated in the [facility] system, and faxed to the ordering physician.   This report is also available in [employer] and in the [employer]. See [facility] Obstetric US report.     Assessment: Gestational HTN, proteinuria in clinic Patient Active Problem List  Diagnoses  . Subchorionic hemorrhage  . History of anemia  . Prior pregnancy complicated by PIH, antepartum  . History of recurrent UTIs  . Hemorrhage  . Supervision of high-risk pregnancy  . Gestational hypertension without significant proteinuria in first trimester    Plan: Discharge home Kick counts, Pre x precautions FU in Clinic Thursday as scheduled.  [name] 04/29/2011,11:18 AM

## 2011-04-29 NOTE — Progress Notes (Signed)
Brought in 24hr urine, here for BP check and [place].  Denies HA, visual changes, or epigastric pain.  Denies increase in swelling.

## 2011-04-30 NOTE — ED Provider Notes (Signed)
Agree with above note.  Penny Becker,Tobey H. 04/30/2011 2:20 AM

## 2011-05-01 LAB — PROTEIN, URINE, 24 HOUR
Collection Interval-UPROT: 24 hours
Protein, 24H Urine: 64 mg/d (ref 50–100)
Protein, Urine: 8 mg/dL
Urine Total Volume-UPROT: 800 mL

## 2011-05-01 NOTE — ED Provider Notes (Signed)
Agree with above note.  Penny Becker 05/01/2011 10:05 AM

## 2011-05-02 ENCOUNTER — Ambulatory Visit (INDEPENDENT_AMBULATORY_CARE_PROVIDER_SITE_OTHER): Payer: Medicaid Other | Admitting: Obstetrics and Gynecology

## 2011-05-02 DIAGNOSIS — O09899 Supervision of other high risk pregnancies, unspecified trimester: Secondary | ICD-10-CM

## 2011-05-02 DIAGNOSIS — O0990 Supervision of high risk pregnancy, unspecified, unspecified trimester: Secondary | ICD-10-CM

## 2011-05-02 DIAGNOSIS — Z87440 Personal history of urinary (tract) infections: Secondary | ICD-10-CM

## 2011-05-02 DIAGNOSIS — O131 Gestational [pregnancy-induced] hypertension without significant proteinuria, first trimester: Secondary | ICD-10-CM

## 2011-05-02 DIAGNOSIS — O09299 Supervision of pregnancy with other poor reproductive or obstetric history, unspecified trimester: Secondary | ICD-10-CM

## 2011-05-02 DIAGNOSIS — O139 Gestational [pregnancy-induced] hypertension without significant proteinuria, unspecified trimester: Secondary | ICD-10-CM

## 2011-05-02 LAB — POCT URINALYSIS DIP (DEVICE)
Bilirubin Urine: NEGATIVE
Glucose, UA: NEGATIVE mg/dL
Ketones, ur: NEGATIVE mg/dL
Nitrite: NEGATIVE
Protein, ur: NEGATIVE mg/dL
Specific Gravity, Urine: 1.02 (ref 1.005–1.030)
Urobilinogen, UA: 1 mg/dL (ref 0.0–1.0)
pH: 6.5 (ref 5.0–8.0)

## 2011-05-02 NOTE — Progress Notes (Signed)
Patient doing well without complaints. FM/PTL precautions reviewed. Pre-eclampsia precautions also discussed.

## 2011-05-02 NOTE — Progress Notes (Signed)
Pelvic pressure. Pulse 91.

## 2011-05-04 NOTE — ED Provider Notes (Signed)
I have reviewed the information pertaining to this patient and I agree with the above. Cam Hai 5:36 PM 05/04/2011

## 2011-05-07 NOTE — L&D Delivery Note (Signed)
Delivery Note At 7:43 PM a viable and healthy female was delivered via Vaginal, Spontaneous Delivery (Presentation: Left Occiput Anterior).  APGAR: 9/9, ; weight 3095g .   Placenta status: Intact, Spontaneous.  Cord: 3 vessels with the following complications: None.  Cord pH: not indicated  Anesthesia: Epidural  Episiotomy: None Lacerations: 1st degree;Vaginal Suture Repair: not indicated Est. Blood Loss (mL):  Mom to postpartum.  Baby to nursery-stable.  Sharen Counter, CNM, precepting delivery  Cameron Proud 05/27/2011, 7:58 PM

## 2011-05-09 ENCOUNTER — Ambulatory Visit (INDEPENDENT_AMBULATORY_CARE_PROVIDER_SITE_OTHER): Payer: Self-pay | Admitting: Obstetrics & Gynecology

## 2011-05-09 VITALS — BP 144/90 | Temp 97.6°F | Wt 219.9 lb

## 2011-05-09 DIAGNOSIS — O131 Gestational [pregnancy-induced] hypertension without significant proteinuria, first trimester: Secondary | ICD-10-CM

## 2011-05-09 DIAGNOSIS — O0990 Supervision of high risk pregnancy, unspecified, unspecified trimester: Secondary | ICD-10-CM

## 2011-05-09 DIAGNOSIS — O139 Gestational [pregnancy-induced] hypertension without significant proteinuria, unspecified trimester: Secondary | ICD-10-CM

## 2011-05-09 LAB — COMPREHENSIVE METABOLIC PANEL
ALT: 8 U/L (ref 0–35)
AST: 14 U/L (ref 0–37)
Albumin: 2.9 g/dL — ABNORMAL LOW (ref 3.5–5.2)
Alkaline Phosphatase: 121 U/L — ABNORMAL HIGH (ref 39–117)
BUN: 7 mg/dL (ref 6–23)
CO2: 21 mEq/L (ref 19–32)
Calcium: 8.5 mg/dL (ref 8.4–10.5)
Chloride: 106 mEq/L (ref 96–112)
Creat: 0.59 mg/dL (ref 0.50–1.10)
Glucose, Bld: 86 mg/dL (ref 70–99)
Potassium: 4.1 mEq/L (ref 3.5–5.3)
Sodium: 137 mEq/L (ref 135–145)
Total Bilirubin: 0.2 mg/dL — ABNORMAL LOW (ref 0.3–1.2)
Total Protein: 5.8 g/dL — ABNORMAL LOW (ref 6.0–8.3)

## 2011-05-09 LAB — CBC
HCT: 34.5 % — ABNORMAL LOW (ref 36.0–46.0)
Hemoglobin: 11.3 g/dL — ABNORMAL LOW (ref 12.0–15.0)
MCH: 29.7 pg (ref 26.0–34.0)
MCHC: 32.8 g/dL (ref 30.0–36.0)
MCV: 90.6 fL (ref 78.0–100.0)
Platelets: 236 10*3/uL (ref 150–400)
RBC: 3.81 MIL/uL — ABNORMAL LOW (ref 3.87–5.11)
RDW: 12.7 % (ref 11.5–15.5)
WBC: 10.6 10*3/uL — ABNORMAL HIGH (ref 4.0–10.5)

## 2011-05-09 LAB — POCT URINALYSIS DIP (DEVICE)
Bilirubin Urine: NEGATIVE
Glucose, UA: NEGATIVE mg/dL
Hgb urine dipstick: NEGATIVE
Ketones, ur: NEGATIVE mg/dL
Nitrite: NEGATIVE
Protein, ur: NEGATIVE mg/dL
Specific Gravity, Urine: 1.025 (ref 1.005–1.030)
Urobilinogen, UA: 0.2 mg/dL (ref 0.0–1.0)
pH: 5.5 (ref 5.0–8.0)

## 2011-05-09 NOTE — Progress Notes (Signed)
Having reflux and some sob. Is taking a med for reflux but doesn't know name.

## 2011-05-09 NOTE — Progress Notes (Addendum)
NST reactive.

## 2011-05-09 NOTE — Progress Notes (Signed)
Pt denies HA, scot, RUQ pain.  Will repeat 24 hr urine and labs.  Start 2x week testing.  Will monitor for signs of pre eclampsia.  Currently dx is gestational HTN.  Nml growth on 04/29/11

## 2011-05-09 NOTE — Patient Instructions (Signed)
Preeclampsia and Eclampsia Preeclampsia is a condition of high blood pressure during pregnancy. It can happen at 20 weeks or later in pregnancy. If high blood pressure occurs in the second half of pregnancy with no other symptoms, it is called gestational hypertension and goes away after the baby is born. If any of the symptoms listed below develop with gestational hypertension, it is then called preeclampsia. Eclampsia (convulsions) may follow preeclampsia. This is one of the reasons for regular prenatal checkups. Early diagnosis and treatment are very important to prevent eclampsia. CAUSES  There is no known cause of preeclampsia/eclampsia in pregnancy. There are several known conditions that may put the pregnant woman at risk, such as:  The first pregnancy.   Having preeclampsia in a past pregnancy.   Having lasting (chronic) high blood pressure.   Having multiples (twins, triplets).   Being age 35 or older.   African American ethnic background.   Having kidney disease or diabetes.   Medical conditions such as lupus or blood diseases.   Being overweight (obese).  SYMPTOMS   High blood pressure.   Headaches.   Sudden weight gain.   Swelling of hands, face, legs, and feet.   Protein in the urine.   Feeling sick to your stomach (nauseous) and throwing up (vomiting).   Vision problems (blurred or double vision).   Numbness in the face, arms, legs, and feet.   Dizziness.   Slurred speech.   Preeclampsia can cause growth retardation in the fetus.   Separation (abruption) of the placenta.   Not enough fluid in the amniotic sac (oligohydramnios).   Sensitivity to bright lights.   Belly (abdominal) pain.  DIAGNOSIS  If protein is found in the urine in the second half of pregnancy, this is considered preeclampsia. Other symptoms mentioned above may also be present. TREATMENT  It is necessary to treat this.  Your caregiver may prescribe bed rest early in this  condition. Plenty of rest and salt restriction may be all that is needed.   Medicines may be necessary to lower blood pressure if the condition does not respond to more conservative measures.   In more severe cases, hospitalization may be needed:   For treatment of blood pressure.   To control fluid retention.   To monitor the baby to see if the condition is causing harm to the baby.   Hospitalization is the best way to treat the first sign of preeclampsia. This is so the mother and baby can be watched closely and blood tests can be done effectively and correctly.   If the condition becomes severe, it may be necessary to induce labor or to remove the infant by surgical means (cesarean section). The best cure for preeclampsia/eclampsia is to deliver the baby.  Preeclampsia and eclampsia involve risks to mother and infant. Your caregiver will discuss these risks with you. Together, you can work out the best possible approach to your problems. Make sure you keep your prenatal visits as scheduled. Not keeping appointments could result in a chronic or permanent injury, pain, disability to you, and death or injury to you or your unborn baby. If there is any problem keeping the appointment, you must call to reschedule. HOME CARE INSTRUCTIONS   Keep your prenatal appointments and tests as scheduled.   Tell your caregiver if you have any of the above risk factors.   Get plenty of rest and sleep.   Eat a balanced diet that is low in salt, and do not add salt   to your food.   Avoid stressful situations.   Only take over-the-counter and prescriptions medicines for pain, discomfort, or fever as directed by your caregiver.  SEEK IMMEDIATE MEDICAL CARE IF:   You develop severe swelling anywhere in the body. This usually occurs in the legs.   You gain 5 lb/2.3 kg or more in a week.   You develop a severe headache, dizziness, problems with your vision, or confusion.   You have abdominal pain,  nausea, or vomiting.   You have a seizure.   You have trouble moving any part of your body, or you develop numbness or problems speaking.   You have bruising or abnormal bleeding from anywhere in the body.   You develop a stiff neck.   You pass out.  MAKE SURE YOU:   Understand these instructions.   Will watch your condition.   Will get help right away if you are not doing well or get worse.  Document Released: 04/19/2000 Document Revised: 01/02/2011 Document Reviewed: 12/04/2007 ExitCare Patient Information 2012 [employer]. 

## 2011-05-09 NOTE — Progress Notes (Signed)
GBS and Cx today.  No headache, scotomata, RUQ pain.  BP still up.  Will rpt 24 hour urine and labs.  Start 2x/wk testing.

## 2011-05-10 LAB — GC/CHLAMYDIA PROBE AMP, GENITAL
Chlamydia, DNA Probe: NEGATIVE
GC Probe Amp, Genital: NEGATIVE

## 2011-05-11 LAB — CREATININE CLEARANCE, URINE, 24 HOUR
Creatinine Clearance: 201 mL/min — ABNORMAL HIGH (ref 75–115)
Creatinine, 24H Ur: 1705 mg/d (ref 700–1800)
Creatinine, Urine: 100.3 mg/dL
Creatinine: 0.59 mg/dL (ref 0.50–1.10)

## 2011-05-11 LAB — PROTEIN, URINE, 24 HOUR
Protein, 24H Urine: 68 mg/d (ref 50–100)
Protein, Urine: 4 mg/dL

## 2011-05-12 LAB — CULTURE, BETA STREP (GROUP B ONLY)

## 2011-05-13 ENCOUNTER — Ambulatory Visit (INDEPENDENT_AMBULATORY_CARE_PROVIDER_SITE_OTHER): Payer: Self-pay | Admitting: *Deleted

## 2011-05-13 VITALS — BP 145/77

## 2011-05-13 DIAGNOSIS — O139 Gestational [pregnancy-induced] hypertension without significant proteinuria, unspecified trimester: Secondary | ICD-10-CM

## 2011-05-13 NOTE — Progress Notes (Signed)
P = 81  NST/AFI  today

## 2011-05-14 NOTE — Progress Notes (Signed)
NST performed on 05/13/2011 was reviewed and was found to be reactive.  AFI was normal at 9.6 cm.  Continue recommended antenatal testing and prenatal care.

## 2011-05-16 ENCOUNTER — Ambulatory Visit (INDEPENDENT_AMBULATORY_CARE_PROVIDER_SITE_OTHER): Payer: Self-pay | Admitting: Obstetrics & Gynecology

## 2011-05-16 VITALS — BP 137/86 | Wt 225.9 lb

## 2011-05-16 DIAGNOSIS — O09899 Supervision of other high risk pregnancies, unspecified trimester: Secondary | ICD-10-CM

## 2011-05-16 DIAGNOSIS — O09299 Supervision of pregnancy with other poor reproductive or obstetric history, unspecified trimester: Secondary | ICD-10-CM

## 2011-05-16 DIAGNOSIS — O468X9 Other antepartum hemorrhage, unspecified trimester: Secondary | ICD-10-CM

## 2011-05-16 DIAGNOSIS — O418X90 Other specified disorders of amniotic fluid and membranes, unspecified trimester, not applicable or unspecified: Secondary | ICD-10-CM

## 2011-05-16 DIAGNOSIS — O131 Gestational [pregnancy-induced] hypertension without significant proteinuria, first trimester: Secondary | ICD-10-CM

## 2011-05-16 DIAGNOSIS — O139 Gestational [pregnancy-induced] hypertension without significant proteinuria, unspecified trimester: Secondary | ICD-10-CM

## 2011-05-16 LAB — POCT URINALYSIS DIP (DEVICE)
Bilirubin Urine: NEGATIVE
Glucose, UA: NEGATIVE mg/dL
Ketones, ur: NEGATIVE mg/dL
Nitrite: NEGATIVE
Protein, ur: 30 mg/dL — AB
Specific Gravity, Urine: >=1.03 (ref 1.005–1.030)
Urobilinogen, UA: 0.2 mg/dL (ref 0.0–1.0)
pH: 6 (ref 5.0–8.0)

## 2011-05-16 NOTE — Progress Notes (Signed)
Pressure but no UC. Good FM. Labor precautions, PIH precaution. 24 hr protein 05/10/11 61 mg, nl Trace edema R>L NST reviewed, reactive today

## 2011-05-16 NOTE — Progress Notes (Signed)
P=101, c/o edema in feet has gotten worse , especially right foot x 3 days, c/o headaches sometimes=7-8 resolves with rest, c/o blurry vision sometimes, c/o having really bad pelvic pressure,

## 2011-05-16 NOTE — Patient Instructions (Signed)
Hypertension in Pregnancy Hypertension means high blood pressure. Blood pressure moves blood in your body. Sometimes, the force that moves the blood becomes too strong. When you are pregnant, this condition should be watched carefully. HOME CARE   Follow your caregiver's instructions carefully.   Only take medicine as told by your doctor.  GET HELP RIGHT AWAY IF:  You have belly (abdominal) pain.   You have sudden puffiness (swelling) in the hands, ankles, or face.   You gain 4 pounds (1.8 kilograms) or more in 1 week.   You throw up (vomit) repeatedly.   You have vaginal bleeding.   You do not feel the baby moving as much.   You have a headache.   You have blurred or double vision.   You have muscle twitching or spasms.   You have shortness of breath.   You have blue fingernails and lips.   You have blood in your pee (urine).  MAKE SURE YOU:  Understand these instructions.   Will watch your condition.   Will get help right away if you are not doing well.  Document Released: 05/25/2010 Document Revised: 08/07/2010 Document Reviewed: 03/10/2007 ExitCare Patient Information 2012 [employer]. 

## 2011-05-19 ENCOUNTER — Inpatient Hospital Stay (HOSPITAL_COMMUNITY)
Admission: AD | Admit: 2011-05-19 | Discharge: 2011-05-19 | Disposition: A | Discharge status: Home / Self Care | Payer: Medicaid Other | Source: Ambulatory Visit | Attending: Obstetrics and Gynecology | Admitting: Obstetrics and Gynecology

## 2011-05-19 DIAGNOSIS — R197 Diarrhea, unspecified: Secondary | ICD-10-CM | POA: Insufficient documentation

## 2011-05-19 DIAGNOSIS — O479 False labor, unspecified: Secondary | ICD-10-CM | POA: Insufficient documentation

## 2011-05-19 DIAGNOSIS — O99891 Other specified diseases and conditions complicating pregnancy: Secondary | ICD-10-CM | POA: Insufficient documentation

## 2011-05-19 LAB — URINALYSIS, ROUTINE W REFLEX MICROSCOPIC
Bilirubin Urine: NEGATIVE
Glucose, UA: NEGATIVE mg/dL
Hgb urine dipstick: NEGATIVE
Ketones, ur: NEGATIVE mg/dL
Nitrite: NEGATIVE
Protein, ur: NEGATIVE mg/dL
Specific Gravity, Urine: 1.02 (ref 1.005–1.030)
Urobilinogen, UA: 0.2 mg/dL (ref 0.0–1.0)
pH: 6.5 (ref 5.0–8.0)

## 2011-05-19 LAB — URINE MICROSCOPIC-ADD ON

## 2011-05-19 NOTE — Progress Notes (Signed)
Pt reports having diarrhea all night. No vomiting.Reports back and abd pressure.

## 2011-05-19 NOTE — ED Provider Notes (Signed)
History     Chief Complaint  Patient presents with  . Diarrhea   HPI [name] is a [id number] who presents today at 37 wks 1d with a chief complaint of diarrhea.  She states that the diarrhea began around 2100 hours and that she was having loose, watery stools and abdominal cramping about every 90 min. The cramping became more steady and is in her lower BL abdominal quadrants and lower back and occurs every 3-5 minutes.  She denies andy fevers, nausea, vomiting.  She has no recent travel history, antibiotic use,  or sick contacts. Her diet has not changed recently.  She is a patient in the [facility], she reports a history of preeclampsia and is currently being followed for borderline PIH.  She was dilated to 2.5 cm last Thursday. She denies any bleeding or fluid leakage from her vagina.  She denies bloody show.  She Denies any headaches, blurred vision, scotomata,  Dyspnea, chest pain, or calf pain.     Past Medical History  Diagnosis Date  . No pertinent past medical history   . Anemia     1st pregnancy, tx'd with iron  . Abnormal Pap smear   . Subchorionic hemorrhage 10/22/10  . Prior pregnancy complicated by PIH, antepartum     during 1st pregnancy  . History of recurrent UTIs last one 2 months ago  . Chlamydia     Past Surgical History  Procedure Date  . Induced abortion     2010, 2011  . Dilation and curettage of uterus     Family History  Problem Relation Age of Onset  . Schizophrenia Paternal Grandmother   . Hypertension Father   . Kidney disease Daughter     horse shoe kidneys recurrent UTI  . Anesthesia problems Neg Hx     History  Substance Use Topics  . Smoking status: Former Smoker -- 1.0 packs/day    Quit date: 09/07/2010  . Smokeless tobacco: Never Used  . Alcohol Use: No    Allergies: No Known Allergies  Prescriptions prior to admission  Medication Sig Dispense Refill  . pantoprazole (PROTONIX) 40 MG tablet Take 40 mg by mouth daily.          Review  of Systems  All other systems reviewed and are negative.   Physical Exam   Blood pressure 140/77, pulse 81, temperature 98.4 F (36.9 C), temperature source Oral, resp. rate 18, height 5' 7.5" (1.715 m), weight 100.88 kg (222 lb 6.4 oz), last menstrual period 09/01/2010.  Physical Exam  Vitals reviewed. Constitutional: She is oriented to person, place, and time. She appears well-developed and well-nourished. No distress.  HENT:  Head: Normocephalic and atraumatic.  Eyes: Pupils are equal, round, and reactive to light.  Cardiovascular: Normal rate, regular rhythm, normal heart sounds and intact distal pulses.   Respiratory: Breath sounds normal.  GI: There is no tenderness. There is no rebound.  Genitourinary:       Cervix dilated to 3 cm. No fluid or blood in the vaginal vault.  Musculoskeletal: Normal range of motion.  Neurological: She is alert and oriented to person, place, and time.  Skin: Skin is warm and dry.  Psychiatric: Her behavior is normal.    MAU Course  Procedures NST reactive with contractions every 3-5 minutes.  No decelerations or variables.    Assessment and Plan  1) Diarrhea  - diarrhea seems to have slowed, observe patient for recurrence of symptoms, oral rehydration therapy 2) Contractions  -  Oral rehydration therapy, observe patient  Patient observed for 1 hour with no cervical change. Will discharge home for follow up with [facility] on tthe 17th.  Patient was advised to return for symptoms of labor per discharge instructions.  Arthor Captain 05/19/2011, 9:58 AM

## 2011-05-19 NOTE — ED Provider Notes (Signed)
I have seen and examined this patient in conjunction with Arthor Captain, PA-S.  I have taken this history and performed the exam.  I agree with the note as written above and have made corrections as needed.   Candelaria Celeste JEHIEL 05/19/2011 2:21 PM

## 2011-05-19 NOTE — ED Notes (Signed)
Patient given pitcher of water

## 2011-05-20 ENCOUNTER — Ambulatory Visit (INDEPENDENT_AMBULATORY_CARE_PROVIDER_SITE_OTHER): Payer: Self-pay | Admitting: *Deleted

## 2011-05-20 VITALS — BP 121/69

## 2011-05-20 DIAGNOSIS — O131 Gestational [pregnancy-induced] hypertension without significant proteinuria, first trimester: Secondary | ICD-10-CM

## 2011-05-20 DIAGNOSIS — O139 Gestational [pregnancy-induced] hypertension without significant proteinuria, unspecified trimester: Secondary | ICD-10-CM

## 2011-05-20 NOTE — Progress Notes (Signed)
P=93 

## 2011-05-23 ENCOUNTER — Ambulatory Visit (INDEPENDENT_AMBULATORY_CARE_PROVIDER_SITE_OTHER): Payer: Self-pay | Admitting: Physician Assistant

## 2011-05-23 VITALS — BP 124/74 | Temp 98.4°F | Wt 229.3 lb

## 2011-05-23 DIAGNOSIS — O169 Unspecified maternal hypertension, unspecified trimester: Secondary | ICD-10-CM

## 2011-05-23 LAB — POCT URINALYSIS DIP (DEVICE)
Bilirubin Urine: NEGATIVE
Glucose, UA: NEGATIVE mg/dL
Nitrite: NEGATIVE
Protein, ur: 30 mg/dL — AB
Specific Gravity, Urine: >=1.03 (ref 1.005–1.030)
Urobilinogen, UA: 0.2 mg/dL (ref 0.0–1.0)
pH: 6 (ref 5.0–8.0)

## 2011-05-23 NOTE — Progress Notes (Signed)
Reactive NST. Occasional ctx. Last cvx exam, pt states 3cm. Will sweep membranes at next visit. Pre-x precautions. Cont'd antenatal testing as scheduled.

## 2011-05-23 NOTE — Progress Notes (Signed)
P 

## 2011-05-23 NOTE — Patient Instructions (Signed)

## 2011-05-26 ENCOUNTER — Inpatient Hospital Stay (HOSPITAL_COMMUNITY)
Admission: AD | Admit: 2011-05-26 | Discharge: 2011-05-27 | Disposition: A | Discharge status: Home / Self Care | Payer: Medicaid Other | Source: Ambulatory Visit | Attending: Obstetrics & Gynecology | Admitting: Obstetrics & Gynecology

## 2011-05-26 ENCOUNTER — Encounter (HOSPITAL_COMMUNITY): Payer: Self-pay | Admitting: *Deleted

## 2011-05-26 DIAGNOSIS — R109 Unspecified abdominal pain: Secondary | ICD-10-CM | POA: Insufficient documentation

## 2011-05-26 DIAGNOSIS — M545 Low back pain, unspecified: Secondary | ICD-10-CM | POA: Insufficient documentation

## 2011-05-26 DIAGNOSIS — O479 False labor, unspecified: Secondary | ICD-10-CM | POA: Insufficient documentation

## 2011-05-26 DIAGNOSIS — O139 Gestational [pregnancy-induced] hypertension without significant proteinuria, unspecified trimester: Secondary | ICD-10-CM | POA: Insufficient documentation

## 2011-05-26 NOTE — Progress Notes (Signed)
Pt reports contractions all day, now q 10 minutes, started having back pain and pressure. ? Leaking fluid at 2 pm.

## 2011-05-27 ENCOUNTER — Other Ambulatory Visit: Payer: Self-pay

## 2011-05-27 ENCOUNTER — Encounter (HOSPITAL_COMMUNITY): Payer: Self-pay | Admitting: Anesthesiology

## 2011-05-27 ENCOUNTER — Encounter (HOSPITAL_COMMUNITY): Payer: Self-pay | Admitting: *Deleted

## 2011-05-27 ENCOUNTER — Inpatient Hospital Stay (HOSPITAL_COMMUNITY)
Admission: AD | Admit: 2011-05-27 | Discharge: 2011-05-28 | DRG: 775 | Disposition: A | Discharge status: Home / Self Care | Payer: Medicaid Other | Source: Ambulatory Visit | Attending: Obstetrics & Gynecology | Admitting: Obstetrics & Gynecology

## 2011-05-27 ENCOUNTER — Inpatient Hospital Stay (HOSPITAL_COMMUNITY): Payer: Medicaid Other | Admitting: Anesthesiology

## 2011-05-27 LAB — COMPREHENSIVE METABOLIC PANEL
ALT: 12 U/L (ref 0–35)
AST: 16 U/L (ref 0–37)
Albumin: 2.7 g/dL — ABNORMAL LOW (ref 3.5–5.2)
Alkaline Phosphatase: 136 U/L — ABNORMAL HIGH (ref 39–117)
BUN: 10 mg/dL (ref 6–23)
CO2: 20 mEq/L (ref 19–32)
Calcium: 9 mg/dL (ref 8.4–10.5)
Chloride: 102 mEq/L (ref 96–112)
Creatinine, Ser: 0.6 mg/dL (ref 0.50–1.10)
GFR calc Af Amer: >90 mL/min (ref >90)
GFR calc non Af Amer: >90 mL/min (ref >90)
Glucose, Bld: 80 mg/dL (ref 70–99)
Potassium: 4.1 mEq/L (ref 3.5–5.1)
Sodium: 134 mEq/L — ABNORMAL LOW (ref 135–145)
Total Bilirubin: 0.2 mg/dL — ABNORMAL LOW (ref 0.3–1.2)
Total Protein: 6.5 g/dL (ref 6.0–8.3)

## 2011-05-27 LAB — URINALYSIS, ROUTINE W REFLEX MICROSCOPIC
Bilirubin Urine: NEGATIVE
Glucose, UA: NEGATIVE mg/dL
Hgb urine dipstick: NEGATIVE
Ketones, ur: NEGATIVE mg/dL
Nitrite: NEGATIVE
Protein, ur: NEGATIVE mg/dL
Specific Gravity, Urine: >1.03 — ABNORMAL HIGH (ref 1.005–1.030)
Urobilinogen, UA: 0.2 mg/dL (ref 0.0–1.0)
pH: 6 (ref 5.0–8.0)

## 2011-05-27 LAB — CBC
HCT: 35.3 % — ABNORMAL LOW (ref 36.0–46.0)
Hemoglobin: 11.7 g/dL — ABNORMAL LOW (ref 12.0–15.0)
MCH: 29.6 pg (ref 26.0–34.0)
MCHC: 33.1 g/dL (ref 30.0–36.0)
MCV: 89.4 fL (ref 78.0–100.0)
Platelets: 222 10*3/uL (ref 150–400)
RBC: 3.95 MIL/uL (ref 3.87–5.11)
RDW: 13.2 % (ref 11.5–15.5)
WBC: 12.9 10*3/uL — ABNORMAL HIGH (ref 4.0–10.5)

## 2011-05-27 LAB — PROTEIN / CREATININE RATIO, URINE
Creatinine, Urine: 142.85 mg/dL
Protein Creatinine Ratio: 0.11 (ref 0.00–0.15)
Total Protein, Urine: 15.9 mg/dL

## 2011-05-27 LAB — URINE MICROSCOPIC-ADD ON

## 2011-05-27 LAB — STREP B DNA PROBE: GBS: NEGATIVE

## 2011-05-27 MED ORDER — LACTATED RINGERS IV SOLN: 500.0000 mL | INTRAVENOUS | Status: DC | PRN

## 2011-05-27 MED ORDER — BENZOCAINE-MENTHOL 20-0.5 % EX AERO: 1.0000 "application " | INHALATION_SPRAY | CUTANEOUS | Status: DC | PRN

## 2011-05-27 MED ORDER — ZOLPIDEM TARTRATE 10 MG PO TABS
10.0000 mg | ORAL_TABLET | Freq: Once | ORAL | Status: AC
  Administered 2011-05-27: 10 mg via ORAL
  Filled 2011-05-27: qty 1

## 2011-05-27 MED ORDER — TETANUS-DIPHTH-ACELL PERTUSSIS 5-2.5-18.5 LF-MCG/0.5 IM SUSP: 0.5000 mL | Freq: Once | INTRAMUSCULAR | Status: DC

## 2011-05-27 MED ORDER — FENTANYL 2.5 MCG/ML BUPIVACAINE 1/10 % EPIDURAL INFUSION (WH - ANES)
INTRAMUSCULAR | Status: DC | PRN
Start: 2011-05-27 — End: 2011-05-27
  Administered 2011-05-27: 14 mL/h via EPIDURAL

## 2011-05-27 MED ORDER — SENNOSIDES-DOCUSATE SODIUM 8.6-50 MG PO TABS: 2.0000 | ORAL_TABLET | Freq: Every day | ORAL | Status: DC

## 2011-05-27 MED ORDER — PHENYLEPHRINE 40 MCG/ML (10ML) SYRINGE FOR IV PUSH (FOR BLOOD PRESSURE SUPPORT): 80.0000 ug | PREFILLED_SYRINGE | INTRAVENOUS | Status: DC | PRN

## 2011-05-27 MED ORDER — LIDOCAINE HCL (PF) 1 % IJ SOLN
30.0000 mL | INTRAMUSCULAR | Status: DC | PRN
  Filled 2011-05-27: qty 30

## 2011-05-27 MED ORDER — LACTATED RINGERS IV SOLN
500.0000 mL | Freq: Once | INTRAVENOUS | Status: AC
  Administered 2011-05-27: 500 mL via INTRAVENOUS

## 2011-05-27 MED ORDER — ONDANSETRON HCL 4 MG/2ML IJ SOLN: 4.0000 mg | Freq: Four times a day (QID) | INTRAMUSCULAR | Status: DC | PRN

## 2011-05-27 MED ORDER — DIBUCAINE 1 % RE OINT: 1.0000 "application " | TOPICAL_OINTMENT | RECTAL | Status: DC | PRN

## 2011-05-27 MED ORDER — OXYTOCIN BOLUS FROM INFUSION
500.0000 mL | Freq: Once | INTRAVENOUS | Status: DC
  Filled 2011-05-27: qty 1000
  Filled 2011-05-27: qty 500

## 2011-05-27 MED ORDER — OXYCODONE-ACETAMINOPHEN 5-325 MG PO TABS: 2.0000 | ORAL_TABLET | ORAL | Status: DC | PRN

## 2011-05-27 MED ORDER — LACTATED RINGERS IV SOLN
INTRAVENOUS | Status: DC
  Administered 2011-05-27: 14:00:00 via INTRAVENOUS
  Administered 2011-05-27: 125 mL/h via INTRAVENOUS

## 2011-05-27 MED ORDER — WITCH HAZEL-GLYCERIN EX PADS: 1.0000 "application " | MEDICATED_PAD | CUTANEOUS | Status: DC | PRN

## 2011-05-27 MED ORDER — FENTANYL 2.5 MCG/ML BUPIVACAINE 1/10 % EPIDURAL INFUSION (WH - ANES)
14.0000 mL/h | INTRAMUSCULAR | Status: DC
  Administered 2011-05-27: 14 mL/h via EPIDURAL
  Filled 2011-05-27 (×2): qty 60

## 2011-05-27 MED ORDER — FLEET ENEMA 7-19 GM/118ML RE ENEM: 1.0000 | ENEMA | RECTAL | Status: DC | PRN

## 2011-05-27 MED ORDER — ACETAMINOPHEN 325 MG PO TABS: 650.0000 mg | ORAL_TABLET | ORAL | Status: DC | PRN

## 2011-05-27 MED ORDER — IBUPROFEN 600 MG PO TABS
600.0000 mg | ORAL_TABLET | Freq: Four times a day (QID) | ORAL | Status: DC | PRN
  Administered 2011-05-27: 600 mg via ORAL

## 2011-05-27 MED ORDER — ONDANSETRON HCL 4 MG/2ML IJ SOLN: 4.0000 mg | INTRAMUSCULAR | Status: DC | PRN

## 2011-05-27 MED ORDER — DIPHENHYDRAMINE HCL 50 MG/ML IJ SOLN: 12.5000 mg | INTRAMUSCULAR | Status: DC | PRN

## 2011-05-27 MED ORDER — LANOLIN HYDROUS EX OINT: TOPICAL_OINTMENT | CUTANEOUS | Status: DC | PRN

## 2011-05-27 MED ORDER — OXYTOCIN 20 UNITS IN LACTATED RINGERS INFUSION - SIMPLE: 125.0000 mL/h | Freq: Once | INTRAVENOUS | Status: DC

## 2011-05-27 MED ORDER — EPHEDRINE 5 MG/ML INJ: 10.0000 mg | INTRAVENOUS | Status: DC | PRN

## 2011-05-27 MED ORDER — IBUPROFEN 600 MG PO TABS
600.0000 mg | ORAL_TABLET | Freq: Four times a day (QID) | ORAL | Status: DC
  Administered 2011-05-28 (×3): 600 mg via ORAL
  Filled 2011-05-27 (×4): qty 1

## 2011-05-27 MED ORDER — DIPHENHYDRAMINE HCL 25 MG PO CAPS: 25.0000 mg | ORAL_CAPSULE | Freq: Four times a day (QID) | ORAL | Status: DC | PRN

## 2011-05-27 MED ORDER — ONDANSETRON HCL 4 MG PO TABS: 4.0000 mg | ORAL_TABLET | ORAL | Status: DC | PRN

## 2011-05-27 MED ORDER — SODIUM BICARBONATE 8.4 % IV SOLN
INTRAVENOUS | Status: DC | PRN
  Administered 2011-05-27: 4 mL via EPIDURAL

## 2011-05-27 MED ORDER — SIMETHICONE 80 MG PO CHEW: 80.0000 mg | CHEWABLE_TABLET | ORAL | Status: DC | PRN

## 2011-05-27 MED ORDER — ZOLPIDEM TARTRATE 5 MG PO TABS: 5.0000 mg | ORAL_TABLET | Freq: Every evening | ORAL | Status: DC | PRN

## 2011-05-27 MED ORDER — PRENATAL MULTIVITAMIN CH
1.0000 | ORAL_TABLET | Freq: Every day | ORAL | Status: DC
  Administered 2011-05-28: 1 via ORAL
  Filled 2011-05-27: qty 1

## 2011-05-27 MED ORDER — CITRIC ACID-SODIUM CITRATE 334-500 MG/5ML PO SOLN: 30.0000 mL | ORAL | Status: DC | PRN

## 2011-05-27 MED ORDER — OXYCODONE-ACETAMINOPHEN 5-325 MG PO TABS
1.0000 | ORAL_TABLET | ORAL | Status: DC | PRN
  Administered 2011-05-28 (×4): 1 via ORAL
  Filled 2011-05-27 (×4): qty 1

## 2011-05-27 NOTE — Progress Notes (Signed)
Patient states she is having contractions every 3-5 minutes. Reports good fetal movement, no bleeding and just a little discharge.

## 2011-05-27 NOTE — Anesthesia Preprocedure Evaluation (Signed)
Anesthesia Evaluation    Airway       Dental   Pulmonary          Cardiovascular     Neuro/Psych    GI/Hepatic hiatal hernia,   Endo/Other  Morbid obesity  Renal/GU      Musculoskeletal   Abdominal   Peds  Hematology   Anesthesia Other Findings   Reproductive/Obstetrics                           Anesthesia Physical Anesthesia Plan  ASA: III  Anesthesia Plan: Epidural   Post-op Pain Management:    Induction:   Airway Management Planned:   Additional Equipment:   Intra-op Plan:   Post-operative Plan:   Informed Consent: I have reviewed the patients History and Physical, chart, labs and discussed the procedure including the risks, benefits and alternatives for the proposed anesthesia with the patient or authorized representative who has indicated his/her understanding and acceptance.   Dental Advisory Given  Plan Discussed with:   Anesthesia Plan Comments: (Labs checked- platelets confirmed with RN in room. Fetal heart tracing, per RN, reported to be stable enough for sitting procedure. Discussed epidural, and patient consents to the procedure:  included risk of possible headache,backache, failed block, allergic reaction, and nerve injury. This patient was asked if she had any questions or concerns before the procedure started. )        Anesthesia Quick Evaluation

## 2011-05-27 NOTE — Progress Notes (Signed)
   Subjective: Patient feeling pressure in her pelvis, thinks that she is ready to push. Remains comfortable with epidural. Denies headache, visual changes.   Objective: BP 139/81  Pulse 73  Temp(Src) 97.5 F (36.4 C) (Axillary)  Resp 15  Ht 5\' 8"  (1.727 m)  Wt 229 lb (103.874 kg)  BMI 34.82 kg/m2  SpO2 99%  LMP 09/01/2010   Total I/O In: -  Out: 300 [Urine:300]  FHT:  FHR: 125 bpm, variability: moderate,  accelerations:  Present,  decelerations:  Absent UC:   regular, every 2 minutes SVE:   Dilation: 10 Effacement (%): 100 Station: +2 Exam by:: Dr Garen Lah  Labs: Lab Results  Component Value Date   WBC 12.9* 05/27/2011   HGB 11.7* 05/27/2011   HCT 35.3* 05/27/2011   MCV 89.4 05/27/2011   PLT 222 05/27/2011    Assessment / Plan: Spontaneous labor, progressing normally  Labor: Progressing normally Preeclampsia:  labs normal, no SSX Fetal Wellbeing:  Category I Pain Control:  Epidural I/D:  n/a Anticipated MOD:  NSVD  Penny Becker 05/27/2011, 6:51 PM

## 2011-05-27 NOTE — Progress Notes (Signed)
I was present for  And performed exam and agree with students note as stated above  -- Jacqulyn Bath, DO, PGY1

## 2011-05-27 NOTE — ED Provider Notes (Signed)
History   Penny Becker is a 21 y.o. [phone] at [redacted]w[redacted]d presenting with main concern of moderate bilateral flank and lower back pain beginning today.  Also uc's throughout the day, never really increasing in frequency or intensity- states they feel like they are q .  Reports possible LOF earlier today, states "it may have been the baby on my bladder."  Followed by [facility] for gestational hypertension- no meds and h/o pre-eclampsia w/ last pregnancy.  Next appointment is on Thursday.  States last vag exam in office was on 1/10 and was 2.5/50/-2.    Chief Complaint  Patient presents with   Labor Eval   HPI  OB History    Grav Para Term Preterm Abortions TAB SAB Ect Mult Living   5 1 1  0 3 2 1  0 0 1      Past Medical History  Diagnosis Date   No pertinent past medical history    Anemia     1st pregnancy, tx'd with iron   Abnormal Pap smear    Subchorionic hemorrhage 10/22/10   Prior pregnancy complicated by [facility], antepartum     during 1st pregnancy   History of recurrent UTIs last one 2 months ago   Chlamydia     Past Surgical History  Procedure Date   Induced abortion     2010, 2011   Dilation and curettage of uterus     Family History  Problem Relation Age of Onset   Schizophrenia Paternal Grandmother    Hypertension Father    Kidney disease Daughter     horse shoe kidneys recurrent UTI   Anesthesia problems Neg Hx     History  Substance Use Topics   Smoking status: Former Smoker -- 1.0 packs/day    Quit date: 09/07/2010   Smokeless tobacco: Never Used   Alcohol Use: No    Allergies: No Known Allergies  Prescriptions prior to admission  Medication Sig Dispense Refill   pantoprazole (PROTONIX) 40 MG tablet Take 40 mg by mouth daily.         Review of Systems  Constitutional: Negative.  Negative for fever and chills.  HENT: Negative.   Eyes: Negative.  Negative for blurred vision and double vision.       Was seeing spots last week-  reported to providers at [facility], has had none since  Respiratory: Negative.   Cardiovascular: Negative.   Gastrointestinal: Positive for abdominal pain (intermittent uc's).  Genitourinary: Positive for flank pain (bilateral). Negative for dysuria, urgency, frequency and hematuria.       Possible LOF earlier today, states "it may have just been the baby sitting on my bladder"  Musculoskeletal: Positive for back pain (bilateral flank and lower back pain beginning today).  Skin: Negative.   Neurological: Negative.  Negative for dizziness, tingling and headaches.  Endo/Heme/Allergies: Negative.   Psychiatric/Behavioral: Negative.    Physical Exam   Blood pressure 136/89, pulse 65, temperature 97.9 F (36.6 C), temperature source Oral, resp. rate 18, height 5' 7.5" (1.715 m), weight 103.874 kg (229 lb), last menstrual period 09/01/2010, SpO2 99.00%.  Physical Exam  Constitutional: She is oriented to person, place, and time. She appears well-developed and well-nourished.  HENT:  Head: Normocephalic.  Eyes: Pupils are equal, round, and reactive to light.  Neck: Normal range of motion.  Cardiovascular: Normal rate and regular rhythm.   Respiratory: Effort normal and breath sounds normal.  GI: Soft.       Gravid   Genitourinary: Uterus normal. Cervix  exhibits no discharge (no pooling of fluid noted). Vaginal discharge (creamy white d/c upon sse) found.       +CVAT bilaterally  Musculoskeletal: Normal range of motion. She exhibits no edema.  Neurological: She is alert and oriented to person, place, and time. She has normal reflexes.  Skin: Skin is warm and dry.  Psychiatric: She has a normal mood and affect. Her behavior is normal. Judgment and thought content normal.   SVE: 4/80/-1, posterior, vtx FHR: 115, moderate variability, 15x15 accels, no decels=Cat I, reactive UCs: mild, irregular  MAU Course  Procedures  EFM SSE, Fern- SVE UA-  Tr leukocytes, few bacteria.  Neg nitrites and  protein.  Assessment and Plan  A: IUP @ 38.2wks     False labor     Intact membranes     Gestational HTN     FHR- Cat I, reactive     Back pain associated w/ pregnancy  P: D/C home     Ambien 10mg  po x 1- friend to drive home     Keep appt w/ [facility] on Thursday     Return to MAU for labor, srom, vb, decreased fm, or other concerns     Warm bath, APAP, heating pad prn for back pain  POC reviewed w/ Zerita Boers, CNM  Joellyn Haff, SNM 05/27/2011, 12:58 AM

## 2011-05-27 NOTE — Progress Notes (Signed)
Here last night, was given Remus Loffler, was able to sleep a little while, woke up with every ctx.

## 2011-05-27 NOTE — Progress Notes (Signed)
Penny Becker is a 21 y.o. [phone] at [redacted]w[redacted]d admitted for active labor  Subjective: Patient is comfortable with epidural. No pain with contractions. No complaints at this time.  Objective: BP 136/73  Pulse 61  Temp(Src) 98.5 F (36.9 C) (Oral)  Resp 20  Ht 5\' 8"  (1.727 m)  Wt 103.874 kg (229 lb)  BMI 34.82 kg/m2  SpO2 99%  LMP 09/01/2010      FHT:  FHR: 140 bpm, variability: moderate,  accelerations:  Present,  decelerations:  Absent UC:   irregular, every 8-10 minutes SVE:   Dilation: 7 Effacement (%): 80 Station: 0 Exam by:: Dr Garen Lah [place] CNM  Labs: Lab Results  Component Value Date   WBC 12.9* 05/27/2011   HGB 11.7* 05/27/2011   HCT 35.3* 05/27/2011   MCV 89.4 05/27/2011   PLT 222 05/27/2011    Assessment / Plan: Spontaneous labor, progressing normally  Labor: Progressing normally Preeclampsia:  Protein creatinine ratio pending. BP stable. Consider magnesium if elevated Fetal Wellbeing:  Category I Pain Control:  Epidural I/D:  n/a Anticipated MOD:  NSVD  Arthor Captain 05/27/2011, 4:07 PM

## 2011-05-27 NOTE — H&P (Signed)
Penny Becker is a 21 y.o. female presenting for contractions. Maternal Medical History:  Reason for admission: Reason for admission: contractions.   Penny Becker is a [id number] who presents today chief complaint of regular contractions. She states that the contractions became regular around 6:00am.  They are now coming every 4-5 minutes and lasting approximately 1 minute . She denies any fevers, nausea, vomiting. She is a patient in the [facility], she reports a history of preeclampsia and is currently being followed for borderline PIH. She denies any bleeding or fluid leakage from her vagina. She denies bloody show. She Denies any headaches, blurred vision, scotomata, Dyspnea, chest pain, or calf pain.  OB History    Grav Para Term Preterm Abortions TAB SAB Ect Mult Living   5 1 1  0 3 2 1  0 0 1     Past Medical History  Diagnosis Date  . Anemia     1st pregnancy, tx'd with iron  . Abnormal Pap smear   . Subchorionic hemorrhage 10/22/10  . Prior pregnancy complicated by PIH, antepartum     during 1st pregnancy  . History of recurrent UTIs last one 2 months ago  . Chlamydia    Past Surgical History  Procedure Date  . Induced abortion     2010, 2011  . Dilation and curettage of uterus    Family History: family history includes Diabetes in her paternal grandmother; Heart disease in her maternal grandfather and maternal grandmother; Hypertension in her father and mother; Kidney disease in her daughter; Liver disease in her maternal grandfather and maternal grandmother; Mental illness in her paternal grandmother; and Schizophrenia in her paternal grandmother.  There is no history of Anesthesia problems. Social History:  reports that she quit smoking about 8 months ago. She has never used smokeless tobacco. She reports that she does not drink alcohol or use illicit drugs.  Review of Systems  All other systems reviewed and are negative.    Dilation: 5.5 Effacement (%): 100 Station: -1 Exam  by:: Penny Becker (BBOW) Blood pressure 143/65, pulse 62, temperature 98.6 F (37 C), temperature source Oral, resp. rate 20, last menstrual period 09/01/2010, SpO2 97.00%. Maternal Exam:  Uterine Assessment: Contraction strength is moderate.  Contraction duration is 2 minutes. Contraction frequency is regular.   Abdomen: Patient reports no abdominal tenderness.   Physical Exam  Constitutional: She is oriented to person, place, and time. She appears well-developed and well-nourished.  HENT:  Head: Normocephalic.  Eyes: EOM are normal. Pupils are equal, round, and reactive to light.  Neck: Neck supple.  Cardiovascular: Normal rate and normal heart sounds.   Respiratory: Effort normal and breath sounds normal.  Musculoskeletal: Normal range of motion.  Neurological: She is alert and oriented to person, place, and time.  Skin: Skin is warm.  Psychiatric: Her behavior is normal.  Cervix is dialted to 5 cm per nurse exam.  Prenatal labs: ABO, Rh: --/--/O POS (06/06 1543) Antibody:   Rubella:   RPR: NON REAC (11/08 1159)  HBsAg: Negative (09/11 0000)  HIV: Non-reactive, Non-reactive (09/11 0000)  GBS: Negative Assessment/Plan: Active Labor, admit for delivery. Monitor blood pressures. (last 144/82).  Check CMP and protein Creatinine ratio.   Penny Becker 05/27/2011, 2:25 PM

## 2011-05-27 NOTE — Anesthesia Procedure Notes (Signed)

## 2011-05-28 LAB — RPR: RPR Ser Ql: NONREACTIVE

## 2011-05-28 MED ORDER — PRENATAL MULTIVITAMIN CH: 1.0000 | ORAL_TABLET | Freq: Every day | ORAL | Status: DC

## 2011-05-28 MED ORDER — BENZOCAINE-MENTHOL 20-0.5 % EX AERO
INHALATION_SPRAY | CUTANEOUS | Status: AC
  Filled 2011-05-28: qty 56

## 2011-05-28 MED ORDER — IBUPROFEN 600 MG PO TABS: 600.0000 mg | ORAL_TABLET | Freq: Four times a day (QID) | ORAL | Status: AC | PRN

## 2011-05-28 NOTE — Progress Notes (Signed)
Seen patient independently and agree with note as above or as modified by me. In Brief mother doing well, no concerns or complains. Exam benign, AFVSS, breast feeding well, comfortable with dicharge. Plan to Dc to home with appropriate followup  Jacqulyn Bath, DO, PGY1

## 2011-05-28 NOTE — Progress Notes (Addendum)
Post Partum Day 1 Subjective: Up ad lib, tolerating PO, +flatus, c/o mild abdominal cramping. Baby is breast and bottle.  Outpatient Circ.    Objective: Blood pressure 135/81, pulse 77, temperature 98.2 F (36.8 C), temperature source Oral, resp. rate 18, height 5\' 8"  (1.727 m), weight 103.874 kg (229 lb), last menstrual period 09/01/2010, SpO2 99.00%, unknown if currently breastfeeding.  Physical Exam:  General: alert, cooperative and no distress Lochia: appropriate Uterine Fundus: firm Incision: N/A DVT Evaluation: No evidence of DVT seen on physical exam. Negative Homan's sign. Calf/Ankle edema is present.   Basename 05/27/11 1345  HGB 11.7*  HCT 35.3*    Assessment/Plan: Discharge home and Contraception patient given handouts on [facility] from up to date.   LOS: 1 day   Arthor Captain 05/28/2011, 8:54 AM

## 2011-05-28 NOTE — Anesthesia Postprocedure Evaluation (Signed)
  Anesthesia Post-op Note  Patient: Penny Becker  Procedure(s) Performed: * No procedures listed *  Patient Location: Mother/Baby  Anesthesia Type: Epidural  Level of Consciousness: alert  and oriented  Airway and Oxygen Therapy: Patient Spontanous Breathing  Post-op Pain: mild  Post-op Assessment: Patient's Cardiovascular Status Stable and Respiratory Function Stable  Post-op Vital Signs: stable  Complications: No apparent anesthesia complications

## 2011-05-28 NOTE — Progress Notes (Signed)
UR Chart review completed.  

## 2011-05-28 NOTE — Discharge Summary (Signed)
Obstetric Discharge Summary Reason for Admission: onset of labor Prenatal Procedures: NST Intrapartum Procedures: spontaneous vaginal delivery Postpartum Procedures: none Complications-Operative and Postpartum: none Hemoglobin  Date Value Range Status  05/27/2011 11.7* 12.0-15.0 (g/dL) Final     HCT  Date Value Range Status  05/27/2011 35.3* 36.0-46.0 (%) Final    Discharge Diagnoses: Term Pregnancy-delivered  Discharge Information: Date: 05/28/2011 Activity: unrestricted Diet: routine Medications: PNV and Ibuprofen Condition: stable Instructions: refer to practice specific booklet Discharge to: home Follow-up Information    Follow up with [facility] in 4 weeks.         Newborn Data: Live born female  Birth Weight: 6 lb 13.2 oz (3095 g) APGAR: 8, 9  Home with mother.  Arthor Captain 05/28/2011, 9:02 AM

## 2011-05-29 ENCOUNTER — Encounter: Payer: Self-pay | Admitting: Obstetrics & Gynecology
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
## 2011-05-30 ENCOUNTER — Other Ambulatory Visit: Payer: Self-pay

## 2011-05-31 NOTE — H&P (Signed)
I have seen this patient and agree with note by PA student.

## 2011-06-03 NOTE — Progress Notes (Signed)
NST 05/20/11 reviewed and reactive 

## 2011-07-01 ENCOUNTER — Other Ambulatory Visit (HOSPITAL_COMMUNITY)
Admission: RE | Admit: 2011-07-01 | Discharge: 2011-07-01 | Disposition: A | Discharge status: Home / Self Care | Payer: Medicaid Other | Source: Ambulatory Visit | Attending: Advanced Practice Midwife | Admitting: Advanced Practice Midwife

## 2011-07-01 ENCOUNTER — Encounter: Payer: Self-pay | Admitting: Obstetrics and Gynecology

## 2011-07-01 ENCOUNTER — Ambulatory Visit (INDEPENDENT_AMBULATORY_CARE_PROVIDER_SITE_OTHER): Payer: Medicaid Other | Admitting: Obstetrics and Gynecology

## 2011-07-01 DIAGNOSIS — O139 Gestational [pregnancy-induced] hypertension without significant proteinuria, unspecified trimester: Secondary | ICD-10-CM

## 2011-07-01 DIAGNOSIS — Z01419 Encounter for gynecological examination (general) (routine) without abnormal findings: Secondary | ICD-10-CM | POA: Insufficient documentation

## 2011-07-01 DIAGNOSIS — O165 Unspecified maternal hypertension, complicating the puerperium: Secondary | ICD-10-CM

## 2011-07-01 NOTE — Progress Notes (Signed)
  Subjective:     Penny Becker is a 21 y.o. female who presents for a postpartum visit. She is 4 weeks postpartum following a spontaneous vaginal delivery. I have fully reviewed the prenatal and intrapartum course. The delivery was at 40 gestational weeks. Outcome: spontaneous vaginal delivery. Anesthesia: epidural. Postpartum course has been uncomplicated . Baby's course has been uncomplicaqted. Baby is feeding by bottle - Similac Isomil. Bleeding no bleeding. Bowel function is normal. Bladder function is normal. Patient is sexually active. Contraception method is condoms. Postpartum depression screening: negative. States abnormal Pap prior to pregnancy  The following portions of the patient's history were reviewed and updated as appropriate: allergies, current medications, past family history, past medical history, past social history, past surgical history and problem list.  Review of Systems Pertinent items are noted in HPI.   Objective:    BP 132/85  Pulse 73  Temp(Src) 96.9 F (36.1 C) (Oral)  Ht 5' 7.5" (1.715 m)  Wt 200 lb 12.8 oz (91.082 kg)  BMI 30.99 kg/m2  Breastfeeding? No  General:  alert, cooperative, appears stated age and no distress   Breasts:  negative  Lungs: clear to auscultation bilaterally  Heart:  regular rate and rhythm, S1, S2 normal, no murmur, click, rub or gallop  Abdomen: soft, non-tender; bowel sounds normal; no masses,  no organomegaly   Vulva:  normal  Vagina: normal vagina  Cervix:  anteverted  Corpus: normal  Adnexa:  normal adnexa  Rectal Exam: Not performed.        Assessment:    4wks normal postpartum exam. Pap smear done at today's visit.   Plan:    1. Contraception: condoms 2. Will go to [facility] or private office for Nexplanon 3. Follow up in: 2 weeks or as needed.

## 2011-07-01 NOTE — Progress Notes (Signed)
Addended by: Sherre Lain A on: 07/01/2011 04:32 PM   Modules accepted: Orders

## 2011-07-01 NOTE — Progress Notes (Signed)
Pt would like implant in arm for birth control.

## 2011-07-02 ENCOUNTER — Encounter: Payer: Self-pay | Admitting: Physician Assistant

## 2011-08-02 ENCOUNTER — Ambulatory Visit: Payer: Medicaid Other | Admitting: Physician Assistant

## 2011-08-05 ENCOUNTER — Encounter: Payer: Self-pay | Admitting: Physician Assistant

## 2011-08-05 ENCOUNTER — Ambulatory Visit (INDEPENDENT_AMBULATORY_CARE_PROVIDER_SITE_OTHER): Payer: Medicaid Other | Admitting: Physician Assistant

## 2011-08-05 VITALS — BP 127/73 | HR 78 | Temp 98.4°F | Ht 67.5 in | Wt 196.2 lb

## 2011-08-05 DIAGNOSIS — Z01812 Encounter for preprocedural laboratory examination: Secondary | ICD-10-CM

## 2011-08-05 DIAGNOSIS — Z30017 Encounter for initial prescription of implantable subdermal contraceptive: Secondary | ICD-10-CM

## 2011-08-05 DIAGNOSIS — Z3049 Encounter for surveillance of other contraceptives: Secondary | ICD-10-CM

## 2011-08-05 LAB — POCT PREGNANCY, URINE: Preg Test, Ur: NEGATIVE

## 2011-08-05 MED ORDER — ETONOGESTREL 68 MG ~~LOC~~ IMPL
68.0000 mg | DRUG_IMPLANT | Freq: Once | SUBCUTANEOUS | Status: AC
  Administered 2011-08-05: 68 mg via SUBCUTANEOUS

## 2011-08-05 NOTE — Progress Notes (Signed)
Chief Complaint:  Procedure    None     Penny Becker is  21 y.o. [phone].  Patient's last menstrual period was 07/15/2011.Marland Kitchen  Her pregnancy status is negative.  She presents complaining of Procedure . Pt presents for Nexplanon insertion  Obstetrical/Gynecological History: OB History    Grav Para Term Preterm Abortions TAB SAB Ect Mult Living   5 2 2  0 3 2 1  0 0 2      Past Medical History: Past Medical History  Diagnosis Date  . Anemia     1st pregnancy, tx'd with iron  . Abnormal Pap smear   . Subchorionic hemorrhage 10/22/10  . Prior pregnancy complicated by PIH, antepartum     during 1st pregnancy  . History of recurrent UTIs last one 2 months ago  . Chlamydia     Past Surgical History: Past Surgical History  Procedure Date  . Induced abortion     2010, 2011  . Dilation and curettage of uterus     Family History: Family History  Problem Relation Age of Onset  . Schizophrenia Paternal Grandmother   . Diabetes Paternal Grandmother   . Mental illness Paternal Grandmother   . Hypertension Father   . Kidney disease Daughter     horse shoe kidneys recurrent UTI  . Anesthesia problems Neg Hx   . Hypertension Mother   . Heart disease Maternal Grandmother   . Liver disease Maternal Grandmother   . Heart disease Maternal Grandfather   . Liver disease Maternal Grandfather     Social History: History  Substance Use Topics  . Smoking status: Former Smoker -- 1.0 packs/day    Quit date: 09/07/2010  . Smokeless tobacco: Never Used  . Alcohol Use: No    Allergies: No Known Allergies   Review of Systems - Negative  Physical Exam   Blood pressure 127/73, pulse 78, temperature 98.4 F (36.9 C), temperature source Oral, height 5' 7.5" (1.715 m), weight 196 lb 3.2 oz (88.996 kg), last menstrual period 07/15/2011.  General: General appearance - alert, well appearing, and in no distress and oriented to person, place, and time Mental status - alert, oriented to  person, place, and time, normal mood, behavior, speech, dress, motor activity, and thought processes, affect appropriate to mood Focused Gynecological Exam: examination not indicated  Labs: Recent Results (from the past 24 hour(s))  POCT PREGNANCY, URINE   Collection Time   08/05/11  2:54 PM      Component Value Range   Preg Test, Ur NEGATIVE  NEGATIVE    Procedure: After informed consent and time out, left arm was cleansed x 2 with betadine prep. 3cc of plain 1% lidocaine was used to achieve appropriate local block. Nexplanon was insterted without difficulty in routine fashion. Pressure was applied to achieve hemostatis and pressure dressing was applied to insertion site.  Assessment: 1. Insertion of implantable subdermal contraceptive    2. Surveillance of other previously prescribed contraceptive method  etonogestrel (IMPLANON) implant 68 mg     Plan: Barrier method encouraged for 1 week RTC in 3 years for removal and pap or prn probs  Penny Andreen E. 08/05/2011,4:18 PM

## 2011-08-05 NOTE — Patient Instructions (Signed)
Contraceptive Implant Information A contraceptive implant is a plastic rod that is inserted under the skin. It is usually inserted under the skin of your upper arm. It continually releases small amounts of progestin (synthetic progesterone) into the bloodstream. This prevents an egg from being released from the ovary. It also thickens the cervical mucus to prevent sperm from entering the cervix, and it thins the uterine lining to prevent a fertilized egg from attaching to the uterus. They can be effective for up to 3 years. Implants do not provide protection against sexually transmitted diseases (STDs).  The procedure to insert an implant usually takes about 10 minutes. There may be minor bruising, swelling, and discomfort at the insertion site for a couple days. The implant begins to work within the first day. Other contraceptive protection should be used for 2 weeks. Follow up with your caregiver to get rechecked as directed. Your caregiver will make sure you are a good candidate for the contraceptive implant. Discuss with your caregiver the possible side effects of the implant ADVANTAGES  It prevents pregnancy for up to 3 years.   It is easily reversible.   It is convenient.   The progestins may protect against uterine and ovarian cancer.   It can be used when breastfeeding.   It can be used by women who cannot take estrogen.  DISADVANTAGES  You may have irregular or unplanned vaginal bleeding.   You may develop side effects, including headache, weight gain, acne, breast tenderness, or mood changes.   You may have tissue or nerve damage after insertion (rare).   It may be difficult and uncomfortable to remove.   Certain medications may interfere with the effectiveness of the implants.  REMOVAL OF IMPLANT The implant should be removed in 3 years or as directed by your caregiver. The implants effect wears off in a few hours after removal. Your ability to get pregnant (fertility) is  restored within a couple of weeks. New implants can be inserted as soon as the old ones are removed if desired. DO NOT GET THE IMPLANT IF:   You are pregnant.   You have a history of breast cancer, osteoporosis, blood clots, heart disease, diabetes, high blood pressure, liver disease, tumors, or stroke.    You have undiagnosed vaginal bleeding.   You have overly sensitive to certain parts of the implant.  Document Released: 04/11/2011 Document Reviewed: 04/09/2011 [employer] Patient Information 2012 [employer]. 

## 2012-04-05 ENCOUNTER — Encounter (HOSPITAL_COMMUNITY): Payer: Self-pay | Admitting: *Deleted

## 2012-04-05 ENCOUNTER — Emergency Department (HOSPITAL_COMMUNITY)
Admission: EM | Admit: 2012-04-05 | Discharge: 2012-04-05 | Disposition: A | Discharge status: Home / Self Care | Payer: Medicaid Other | Attending: Emergency Medicine | Admitting: Emergency Medicine

## 2012-04-05 DIAGNOSIS — W260XXA Contact with knife, initial encounter: Secondary | ICD-10-CM | POA: Insufficient documentation

## 2012-04-05 DIAGNOSIS — Y929 Unspecified place or not applicable: Secondary | ICD-10-CM | POA: Insufficient documentation

## 2012-04-05 DIAGNOSIS — Z8619 Personal history of other infectious and parasitic diseases: Secondary | ICD-10-CM | POA: Insufficient documentation

## 2012-04-05 DIAGNOSIS — S61209A Unspecified open wound of unspecified finger without damage to nail, initial encounter: Secondary | ICD-10-CM | POA: Insufficient documentation

## 2012-04-05 DIAGNOSIS — Z87440 Personal history of urinary (tract) infections: Secondary | ICD-10-CM | POA: Insufficient documentation

## 2012-04-05 DIAGNOSIS — S61219A Laceration without foreign body of unspecified finger without damage to nail, initial encounter: Secondary | ICD-10-CM

## 2012-04-05 DIAGNOSIS — Z862 Personal history of diseases of the blood and blood-forming organs and certain disorders involving the immune mechanism: Secondary | ICD-10-CM | POA: Insufficient documentation

## 2012-04-05 DIAGNOSIS — W261XXA Contact with sword or dagger, initial encounter: Secondary | ICD-10-CM | POA: Insufficient documentation

## 2012-04-05 DIAGNOSIS — Z87891 Personal history of nicotine dependence: Secondary | ICD-10-CM | POA: Insufficient documentation

## 2012-04-05 DIAGNOSIS — Y939 Activity, unspecified: Secondary | ICD-10-CM | POA: Insufficient documentation

## 2012-04-05 MED ORDER — OXYCODONE-ACETAMINOPHEN 5-325 MG PO TABS: 1.0000 | ORAL_TABLET | Freq: Four times a day (QID) | ORAL | Status: DC | PRN

## 2012-04-05 MED ORDER — "THROMBI-PAD 3""X3"" EX PADS"
1.0000 | MEDICATED_PAD | Freq: Once | CUTANEOUS | Status: AC
  Administered 2012-04-05: 1 via TOPICAL
  Filled 2012-04-05: qty 1

## 2012-04-05 MED ORDER — TETANUS-DIPHTH-ACELL PERTUSSIS 5-2.5-18.5 LF-MCG/0.5 IM SUSP
0.5000 mL | Freq: Once | INTRAMUSCULAR | Status: DC
  Filled 2012-04-05: qty 0.5

## 2012-04-05 NOTE — Progress Notes (Signed)
Orthopedic Tech Progress Note Patient Details:  Penny Becker 17-Sep-1990 [id number]  Ortho Devices Type of Ortho Device: Finger splint Ortho Device/Splint Location: (R) UE Ortho Device/Splint Interventions: Application   Jennye Moccasin 04/05/2012, 8:33 PM

## 2012-04-05 NOTE — ED Notes (Signed)
Placed second thrombi pad to area. Bleeding slowed with second pad.

## 2012-04-05 NOTE — ED Provider Notes (Signed)
History   This chart was scribed for Ethelda Chick, MD by Melba Coon, ED Scribe. The patient was seen in room TR09C/TR09C and the patient's care was started at 6:37PM.    CSN: [id number]  Arrival date & time 04/05/12  1639   None     Chief Complaint  Patient presents with  . Laceration    (Consider location/radiation/quality/duration/timing/severity/associated sxs/prior treatment) Patient is a 21 y.o. female presenting with skin laceration. The history is provided by the patient. No language interpreter was used.  Laceration  The incident occurred 3 to 5 hours ago. The laceration is located on the right hand (middle finger). The laceration is 2 cm in size. The laceration mechanism was a a dirty knife. The pain is moderate. The pain has been constant since onset. It is unknown if a foreign body is present. Her tetanus status is unknown.  Bleeding is controlled with plenty of dressings. Denies HA, fever, neck pain, sore throat, rash, back pain, CP, SOB, abdominal pain, nausea, emesis, diarrhea, dysuria, or extremity edema, weakness, numbness, or tingling. No known allergies. No other pertinent medical symptoms.  Past Medical History  Diagnosis Date  . Anemia     1st pregnancy, tx'd with iron  . Abnormal Pap smear   . Subchorionic hemorrhage 10/22/10  . Prior pregnancy complicated by PIH, antepartum     during 1st pregnancy  . History of recurrent UTIs last one 2 months ago  . Chlamydia     Past Surgical History  Procedure Date  . Induced abortion     2010, 2011  . Dilation and curettage of uterus     Family History  Problem Relation Age of Onset  . Schizophrenia Paternal Grandmother   . Diabetes Paternal Grandmother   . Mental illness Paternal Grandmother   . Hypertension Father   . Kidney disease Daughter     horse shoe kidneys recurrent UTI  . Anesthesia problems Neg Hx   . Hypertension Mother   . Heart disease Maternal Grandmother   . Liver disease Maternal  Grandmother   . Heart disease Maternal Grandfather   . Liver disease Maternal Grandfather     History  Substance Use Topics  . Smoking status: Former Smoker -- 1.0 packs/day    Quit date: 09/07/2010  . Smokeless tobacco: Never Used  . Alcohol Use: No    OB History    Grav Para Term Preterm Abortions TAB SAB Ect Mult Living   5 2 2  0 3 2 1  0 0 2      Review of Systems 10 Systems reviewed and all are negative for acute change except as noted in the HPI.   Allergies  Review of patient's allergies indicates no known allergies.  Home Medications   Current Outpatient Rx  Name  Route  Sig  Dispense  Refill  . OXYCODONE-ACETAMINOPHEN 5-325 MG PO TABS   Oral   Take 1-2 tablets by mouth every 6 (six) hours as needed for pain.   15 tablet   0     BP 125/77  Pulse 75  Temp 98.6 F (37 C) (Oral)  Resp 17  SpO2 99%  LMP 04/05/2012  Physical Exam  Nursing note and vitals reviewed. Constitutional:       Awake, alert, nontoxic appearance.  HENT:  Head: Atraumatic.  Eyes: Right eye exhibits no discharge. Left eye exhibits no discharge.  Neck: Neck supple.  Pulmonary/Chest: Effort normal. She exhibits no tenderness.  Abdominal: Soft. There is no tenderness.  There is no rebound.  Musculoskeletal: She exhibits no tenderness.       Baseline ROM, no obvious new focal weakness.  Neurological:       Mental status and motor strength appears baseline for patient and situation.  Skin: No rash noted.       Ventral surface of right middle finger has 2 cm shearing injury with active oozing of blood but no arterial bleeding. Flexor and extensor tendon intact. Sensation distally intact.  Psychiatric: She has a normal mood and affect.    ED Course  Procedures (including critical care time)  COORDINATION OF CARE:  6:40PM - based on examination of the wound, suturing will not be possible. Thrombi-Pad will be ordered for [employer].  7:06PM - after 2 Thrombi-Pad applications,  nurse reports the bleeding is slowing down but not yet fully controlled. Will continue to monitor.   Labs Reviewed - No data to display No results found.   1. Laceration of finger       MDM  Pt presenting with laceration to right middle finger- area is grazed with barely attached skin flap- not able to be reattached- significant oozing of blood, but no arterial bleeding.  thrombipad applied and pt treated with pain meds.  Bleeding stopped after thrombipad.  Tendons appear intact and function is intact on exam.  Finger dressed and finger splint placed.  Pt given information for hand followup.  Discharged with strict return precautions.  Pt agreeable with plan.  I personally performed the services described in this documentation, which was scribed in my presence. The recorded information has been reviewed and is accurate.        Ethelda Chick, MD 04/07/12 [phone]

## 2012-04-05 NOTE — ED Notes (Signed)
Pt reports cutting her right middle finger with knife, bleeding controlled at triage. Tetanus unknown.

## 2012-04-05 NOTE — ED Notes (Signed)
Right finger dressing changed under sterile conditions. Bleeding controlled

## 2012-09-13 ENCOUNTER — Emergency Department (HOSPITAL_COMMUNITY)
Admission: EM | Admit: 2012-09-13 | Discharge: 2012-09-13 | Disposition: A | Discharge status: Home / Self Care | Payer: Medicaid Other | Attending: Emergency Medicine | Admitting: Emergency Medicine

## 2012-09-13 ENCOUNTER — Encounter (HOSPITAL_COMMUNITY): Payer: Self-pay | Admitting: *Deleted

## 2012-09-13 DIAGNOSIS — S0592XA Unspecified injury of left eye and orbit, initial encounter: Secondary | ICD-10-CM

## 2012-09-13 DIAGNOSIS — Z87891 Personal history of nicotine dependence: Secondary | ICD-10-CM | POA: Insufficient documentation

## 2012-09-13 DIAGNOSIS — H53149 Visual discomfort, unspecified: Secondary | ICD-10-CM | POA: Insufficient documentation

## 2012-09-13 DIAGNOSIS — H209 Unspecified iridocyclitis: Secondary | ICD-10-CM | POA: Insufficient documentation

## 2012-09-13 DIAGNOSIS — IMO0002 Reserved for concepts with insufficient information to code with codable children: Secondary | ICD-10-CM | POA: Insufficient documentation

## 2012-09-13 DIAGNOSIS — Y929 Unspecified place or not applicable: Secondary | ICD-10-CM | POA: Insufficient documentation

## 2012-09-13 DIAGNOSIS — S0510XA Contusion of eyeball and orbital tissues, unspecified eye, initial encounter: Secondary | ICD-10-CM | POA: Insufficient documentation

## 2012-09-13 DIAGNOSIS — Y9389 Activity, other specified: Secondary | ICD-10-CM | POA: Insufficient documentation

## 2012-09-13 MED ORDER — PROPARACAINE HCL 0.5 % OP SOLN
1.0000 [drp] | Freq: Once | OPHTHALMIC | Status: AC
  Administered 2012-09-13: 1 [drp] via OPHTHALMIC
  Filled 2012-09-13: qty 15

## 2012-09-13 MED ORDER — IBUPROFEN 400 MG PO TABS: 400.0000 mg | ORAL_TABLET | Freq: Four times a day (QID) | ORAL | Status: DC | PRN

## 2012-09-13 MED ORDER — FLUORESCEIN SODIUM 1 MG OP STRP
1.0000 | ORAL_STRIP | Freq: Once | OPHTHALMIC | Status: AC
  Administered 2012-09-13: 1 via OPHTHALMIC
  Filled 2012-09-13: qty 1

## 2012-09-13 NOTE — ED Notes (Signed)
PT ambulated with baseline gait; VSS; A&Ox3; no signs of distress; respirations even and unlabored; skin warm and dry; no questions upon discharge.  

## 2012-09-13 NOTE — ED Notes (Signed)
Pt poked self in left eye with mascara yesterday.  Pt states vision blurry yesterday and now sensitive to light.

## 2012-09-13 NOTE — ED Provider Notes (Signed)
History     CSN: [id number]  Arrival date & time 09/13/12  4098   First MD Initiated Contact with Patient 09/13/12 1012      Chief Complaint  Patient presents with  . Eye Injury    (Consider location/radiation/quality/duration/timing/severity/associated sxs/prior treatment) HPI  22 year old female presents for evaluations of L eye injury. Patient reports she accidentally poked her L eye with her mascara yesterday.  Since the incident she report acute onset of L eye discomfort, redness, teary and light sensitivity.  The pain has improves overnight but eye is still sensitive to light.  Endorse blurry vision but denies double vision, spot in vision, pain with eye movement, discharge, or vision loss.  Does not wear contact lens.    Past Medical History  Diagnosis Date  . Anemia     1st pregnancy, tx'd with iron  . Abnormal Pap smear   . Subchorionic hemorrhage 10/22/10  . Prior pregnancy complicated by PIH, antepartum     during 1st pregnancy  . History of recurrent UTIs last one 2 months ago  . Chlamydia     Past Surgical History  Procedure Laterality Date  . Induced abortion      2010, 2011  . Dilation and curettage of uterus      Family History  Problem Relation Age of Onset  . Schizophrenia Paternal Grandmother   . Diabetes Paternal Grandmother   . Mental illness Paternal Grandmother   . Hypertension Father   . Kidney disease Daughter     horse shoe kidneys recurrent UTI  . Anesthesia problems Neg Hx   . Hypertension Mother   . Heart disease Maternal Grandmother   . Liver disease Maternal Grandmother   . Heart disease Maternal Grandfather   . Liver disease Maternal Grandfather     History  Substance Use Topics  . Smoking status: Former Smoker -- 1.00 packs/day    Quit date: 09/07/2010  . Smokeless tobacco: Never Used  . Alcohol Use: No    OB History   Grav Para Term Preterm Abortions TAB SAB Ect Mult Living   5 2 2  0 3 2 1  0 0 2      Review of  Systems  Constitutional: Negative for fever.  Eyes: Positive for photophobia, pain and redness. Negative for discharge and itching.  Skin: Negative for rash.  Neurological: Negative for numbness.    Allergies  Review of patient's allergies indicates no known allergies.  Home Medications  No current outpatient prescriptions on file.  BP 130/75  Pulse 69  Temp(Src) 98.8 F (37.1 C) (Oral)  Resp 18  SpO2 98%  Physical Exam  Nursing note and vitals reviewed. Constitutional: She appears well-developed and well-nourished. No distress.  HENT:  Head: Atraumatic.  Eyes: EOM and lids are normal. Pupils are equal, round, and reactive to light. No foreign bodies found. Left eye exhibits no chemosis, no discharge, no exudate and no hordeolum. No foreign body present in the left eye. Left conjunctiva is injected. Left conjunctiva has no hemorrhage. No scleral icterus. Left eye exhibits normal extraocular motion and no nystagmus.  Slit lamp exam:      The left eye shows no corneal abrasion, no corneal flare, no corneal ulcer, no foreign body, no hyphema, no hypopyon, no fluorescein uptake and no anterior chamber bulge.  Neck: Neck supple.  Neurological: She is alert.  Skin: No rash noted.    ED Course  Procedures (including critical care time)  10:54 AM Pt accidentally poked her L  eye with mascara yesterday.  L eye is injected otherwise no evidence of corneal abrasion or chemosis noted.  No fb seen.  Pain to L eye improves with proparacaine eyedrop.  I recommend pt to wear sunglasses, rest, take ibuprofen for pain and f/u with opthalmologist in 3 days if no improvement.    Labs Reviewed - No data to display No results found.   1. Left eye injury, initial encounter   2. Iritis, traumatic       MDM  BP 130/75  Pulse 69  Temp(Src) 98.8 F (37.1 C) (Oral)  Resp 18  SpO2 98%         Fayrene Helper, PA-C 09/13/12 1059

## 2012-09-13 NOTE — ED Provider Notes (Signed)
Medical screening examination/treatment/procedure(s) were performed by non-physician practitioner and as supervising physician I was immediately available for consultation/collaboration. Devoria Albe, MD, Armando Gang   Ward Givens, MD 09/13/12 [phone]

## 2012-09-13 NOTE — ED Notes (Addendum)
meds at bedside for PA to administer.

## 2013-05-21 IMAGING — US US OB TRANSVAGINAL
1 series · 13 of 28 positions shown · non-contrast
Comparison: none

[Series 1: us ob transvaginal · 13 of 34 slices shown]
[im 2/34]
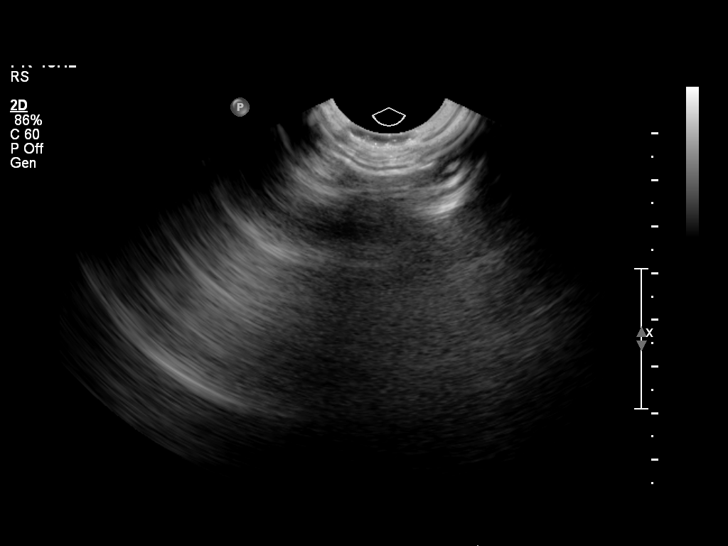
[im 4/34]
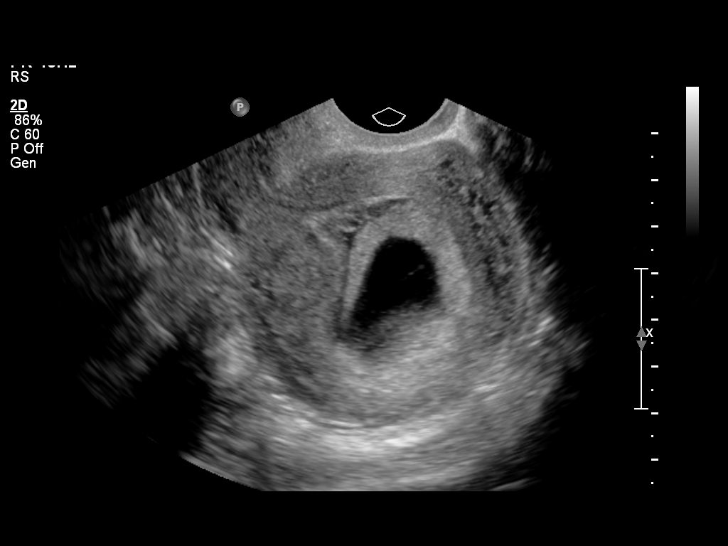
[im 7/34]
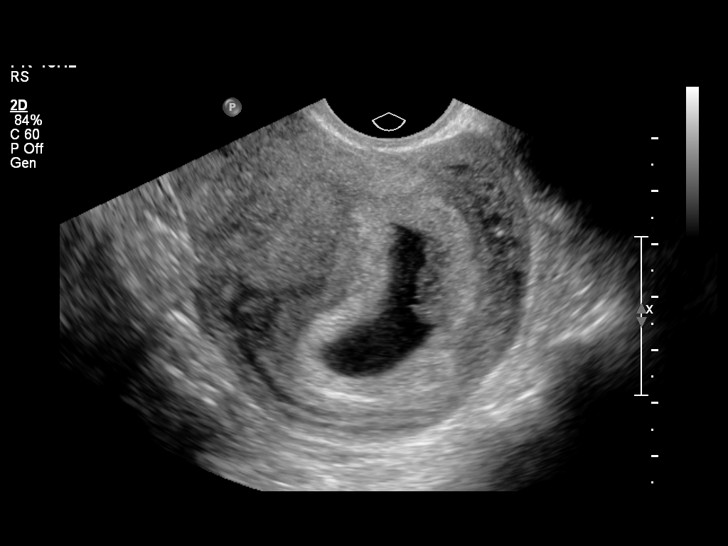
[im 9/34]
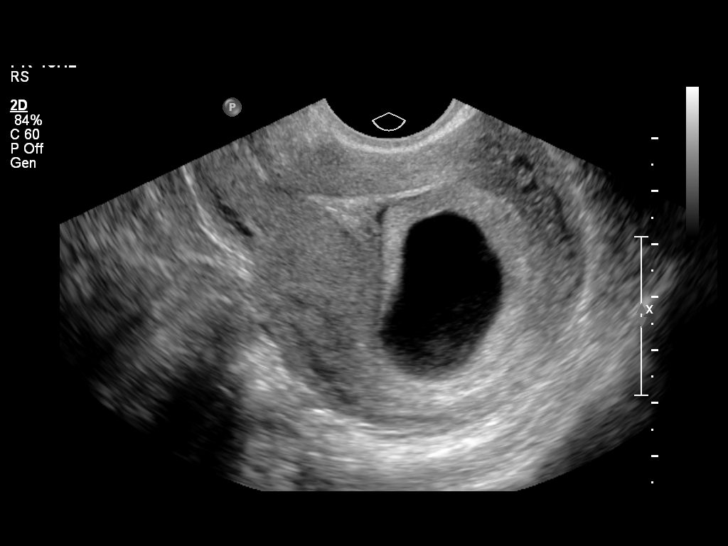
[im 12/34]
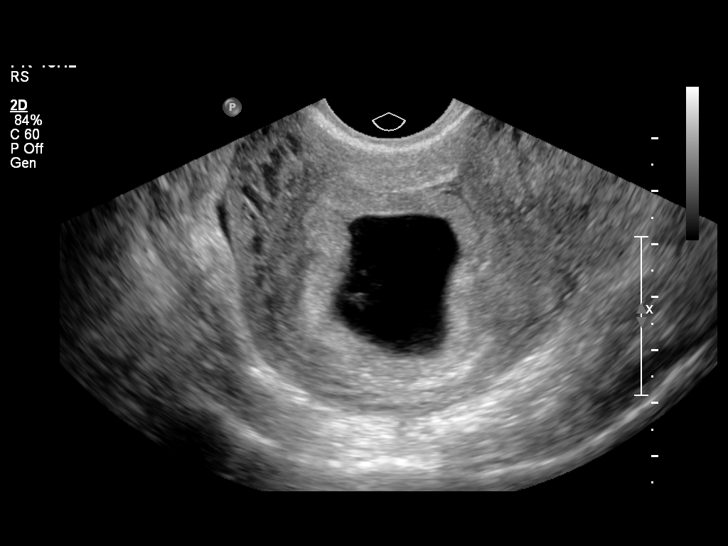
[im 14/34]
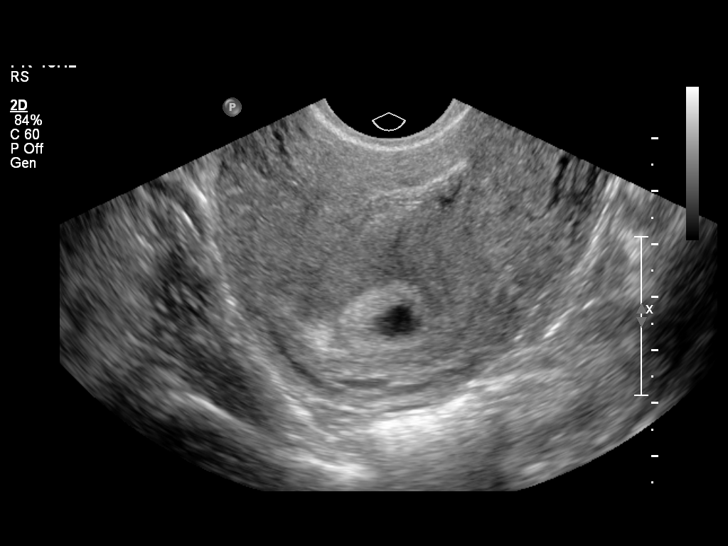
[im 18/34]
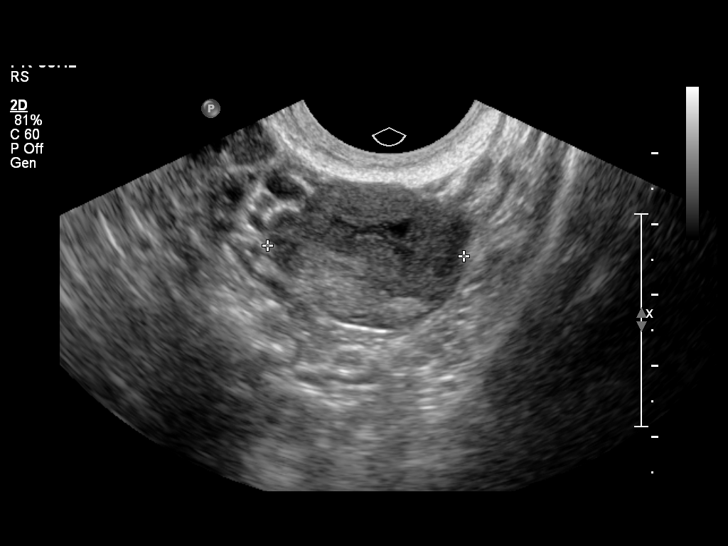
[im 20/34]
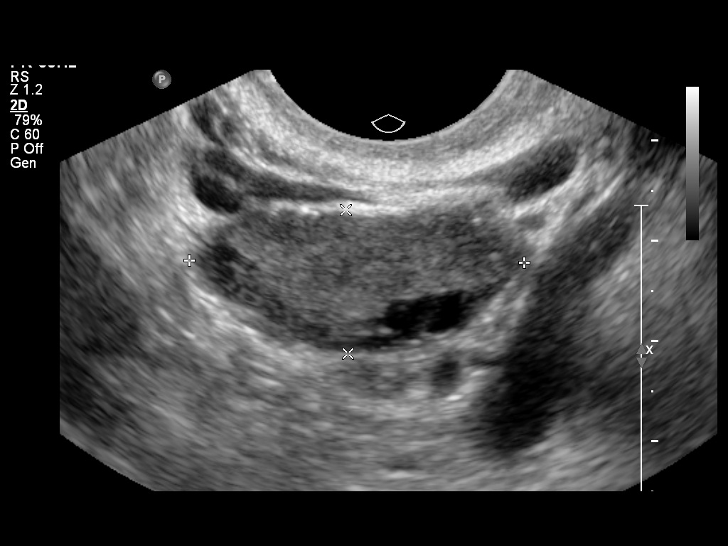
[im 23/34]
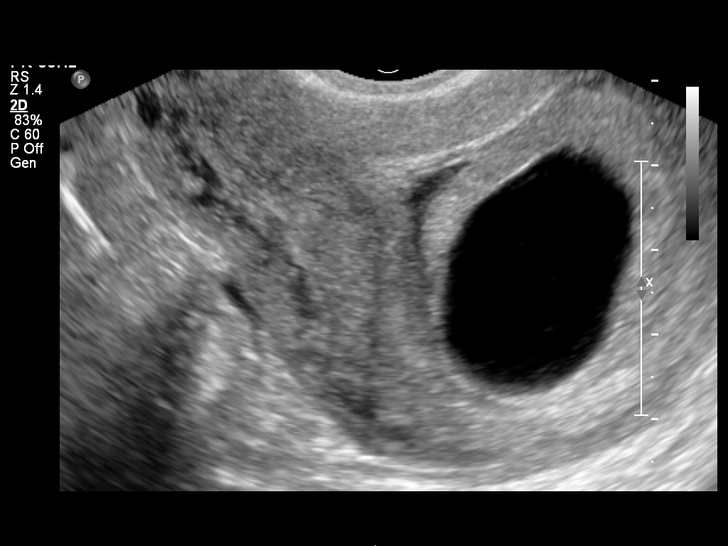
[im 25/34]
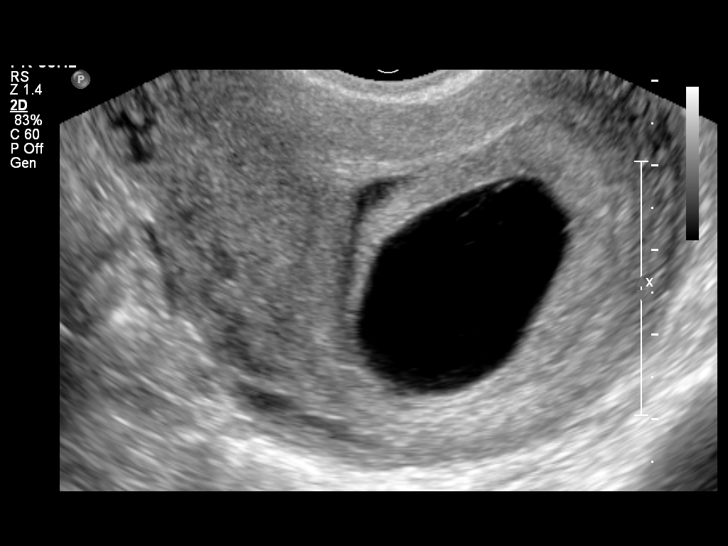
[im 27/34]
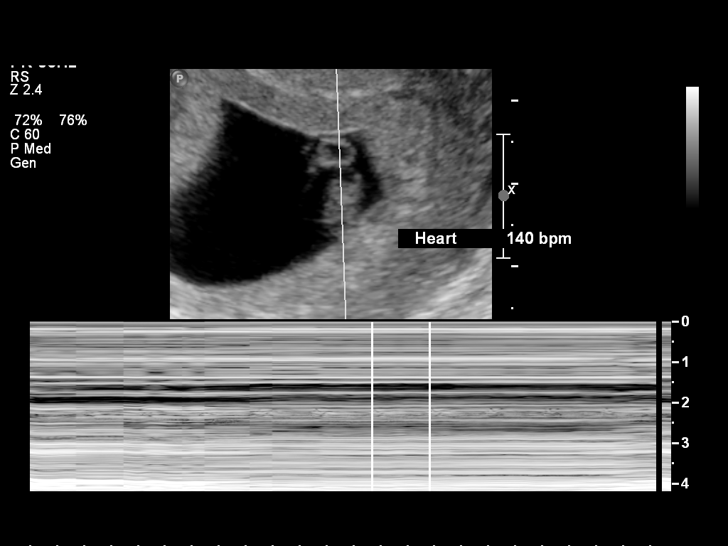
[im 30/34]
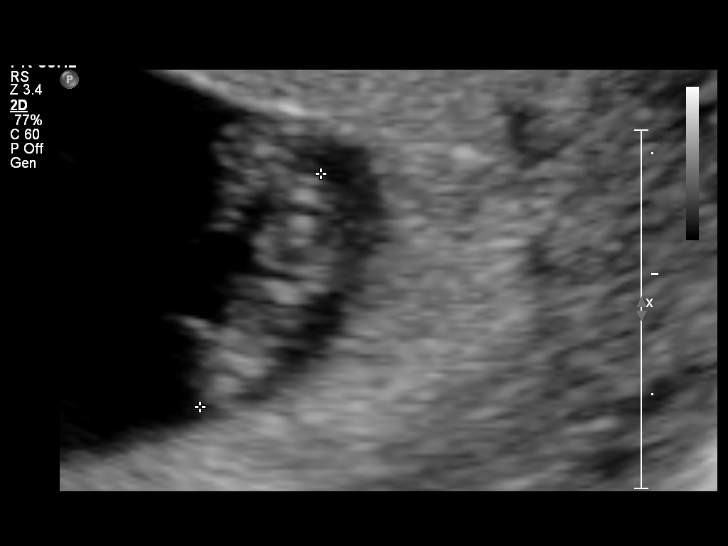
[im 32/34]
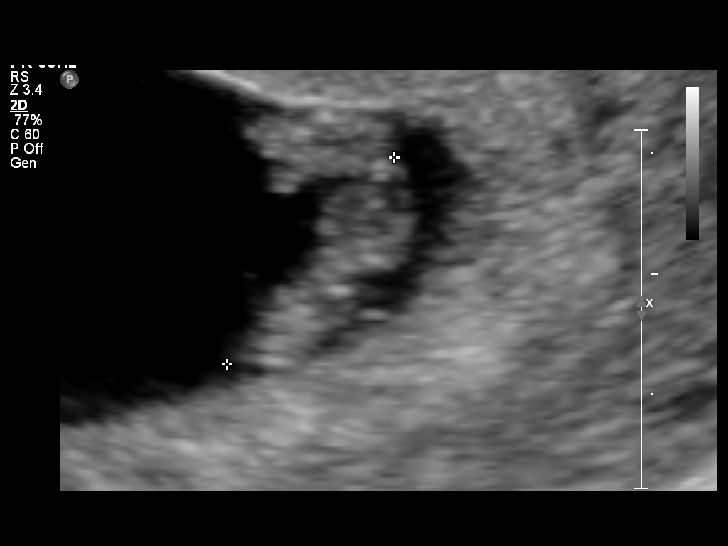

[13 of 28 positions shown; findings below may reference images not displayed]

OBSTETRICS REPORT
                      (Signed Final 10/22/2010 [DATE])

 Order#:         [id number]
Procedures

 US OB TRANSVAGINAL                                    76817.0
Indications

 Vaginal bleeding, unknown etiology
Fetal Evaluation

 Preg. Location:    Intrauterine
 Gest. Sac:         Intrauterine
 Yolk Sac:          Visualized
 Fetal Pole:        Visualized
 Fetal Heart Rate:  140                          bpm
 Cardiac Activity:  Observed

 Comment:    Small subchorionic hemorrhage noted.
Biometry

 CRL:     10.9  mm     G. Age:  7w 1d                  EDD:    06/09/11
Gestational Age

 LMP:           7w 1d         Date:  09/02/10                 EDD:   06/09/11
 Best:          7w 1d      Det. By:  LMP  (09/02/10)          EDD:   06/09/11
Cervix Uterus Adnexa

 Cervix:       Normal appearance by transvaginal scan
 Left Ovary:    Within normal limits measuring 4.0 x 2.3 x 2.8 cm.
 Right Ovary:   Within normal limits measuring 3.3 x 1.4 x 2.2 cm.
Impression

 There is a single living intrauterine pregancy demonstrating
 an EGA by CRL of 7w 1d. This correlates well with expected
 EGA by LMP of 7w 1d.

 Normal ovaries.
 Small subchorionic hemorrhage.

## 2013-11-30 ENCOUNTER — Emergency Department (HOSPITAL_COMMUNITY)
Admission: EM | Admit: 2013-11-30 | Discharge: 2013-11-30 | Disposition: A | Discharge status: Home / Self Care | Payer: Medicaid Other | Attending: Emergency Medicine | Admitting: Emergency Medicine

## 2013-11-30 ENCOUNTER — Encounter (HOSPITAL_COMMUNITY): Payer: Self-pay | Admitting: Emergency Medicine

## 2013-11-30 DIAGNOSIS — S90569A Insect bite (nonvenomous), unspecified ankle, initial encounter: Secondary | ICD-10-CM | POA: Insufficient documentation

## 2013-11-30 DIAGNOSIS — W57XXXA Bitten or stung by nonvenomous insect and other nonvenomous arthropods, initial encounter: Secondary | ICD-10-CM

## 2013-11-30 DIAGNOSIS — Z87891 Personal history of nicotine dependence: Secondary | ICD-10-CM | POA: Insufficient documentation

## 2013-11-30 DIAGNOSIS — Z87440 Personal history of urinary (tract) infections: Secondary | ICD-10-CM | POA: Insufficient documentation

## 2013-11-30 DIAGNOSIS — Y9289 Other specified places as the place of occurrence of the external cause: Secondary | ICD-10-CM | POA: Insufficient documentation

## 2013-11-30 DIAGNOSIS — Z8619 Personal history of other infectious and parasitic diseases: Secondary | ICD-10-CM | POA: Diagnosis not present

## 2013-11-30 DIAGNOSIS — Z862 Personal history of diseases of the blood and blood-forming organs and certain disorders involving the immune mechanism: Secondary | ICD-10-CM | POA: Diagnosis not present

## 2013-11-30 DIAGNOSIS — Y9311 Activity, swimming: Secondary | ICD-10-CM | POA: Insufficient documentation

## 2013-11-30 MED ORDER — BACITRACIN ZINC 500 UNIT/GM EX OINT: 1.0000 "application " | TOPICAL_OINTMENT | Freq: Two times a day (BID) | CUTANEOUS | Status: DC

## 2013-11-30 NOTE — Discharge Instructions (Signed)
Appears to be a simple bite - as there is a puncture type wound. However, there is no signs of infection currently, and this doesn't appear to be a tick bite. Please use the medicine only if the rash is getting worse - if the pain is increasing, a painful noduleforms or if there is pus coming out - come to the ER immediately.   Insect Bite Mosquitoes, flies, fleas, bedbugs, and many other insects can bite. Insect bites are different from insect stings. A sting is when venom is injected into the skin. Some insect bites can transmit infectious diseases. SYMPTOMS  Insect bites usually turn red, swell, and itch for 2 to 4 days. They often go away on their own. TREATMENT  Your caregiver may prescribe antibiotic medicines if a bacterial infection develops in the bite. HOME CARE INSTRUCTIONS  Do not scratch the bite area.  Keep the bite area clean and dry. Wash the bite area thoroughly with soap and water.  Put ice or cool compresses on the bite area.  Put ice in a plastic bag.  Place a towel between your skin and the bag.  Leave the ice on for 20 minutes, 4 times a day for the first 2 to 3 days, or as directed.  You may apply a baking soda paste, cortisone cream, or calamine lotion to the bite area as directed by your caregiver. This can help reduce itching and swelling.  Only take over-the-counter or prescription medicines as directed by your caregiver.  If you are given antibiotics, take them as directed. Finish them even if you start to feel better. You may need a tetanus shot if:  You cannot remember when you had your last tetanus shot.  You have never had a tetanus shot.  The injury broke your skin. If you get a tetanus shot, your arm may swell, get red, and feel warm to the touch. This is common and not a problem. If you need a tetanus shot and you choose not to have one, there is a rare chance of getting tetanus. Sickness from tetanus can be serious. SEEK IMMEDIATE MEDICAL CARE  IF:   You have increased pain, redness, or swelling in the bite area.  You see a red line on the skin coming from the bite.  You have a fever.  You have joint pain.  You have a headache or neck pain.  You have unusual weakness.  You have a rash.  You have chest pain or shortness of breath.  You have abdominal pain, nausea, or vomiting.  You feel unusually tired or sleepy. MAKE SURE YOU:   Understand these instructions.  Will watch your condition.  Will get help right away if you are not doing well or get worse. Document Released: 05/30/2004 Document Revised: 07/15/2011 Document Reviewed: 11/21/2010 [facility] Patient Information 2015 [employer] [place]. This information is not intended to replace advice given to you by your health care provider. Make sure you discuss any questions you have with your health care provider.

## 2013-11-30 NOTE — ED Provider Notes (Signed)
CSN: [id number]     Arrival date & time 11/30/13  0031 History   First MD Initiated Contact with Patient 11/30/13 0122     Chief Complaint  Patient presents with  . Insect Bite    left leg     (Consider location/radiation/quality/duration/timing/severity/associated sxs/prior Treatment) HPI Comments: Pt with no medical hx comes in with cc of insect bite. [place] and saw some bugs floating around, but doesn't recall being bitten. Today, she noticed 2 puncture wounds and got concerned for parasitic infection or wound and decided to come to the ER. There is increased redness around the area, with mild pain with palpation. Pt has been having friends try and the pick the lesion with twizzers. There was no foreign body removed.  The history is provided by the patient.    Past Medical History  Diagnosis Date  . Anemia     1st pregnancy, tx'd with iron  . Abnormal Pap smear   . Subchorionic hemorrhage 10/22/10  . Prior pregnancy complicated by PIH, antepartum     during 1st pregnancy  . History of recurrent UTIs last one 2 months ago  . Chlamydia    Past Surgical History  Procedure Laterality Date  . Induced abortion      2010, 2011  . Dilation and curettage of uterus     Family History  Problem Relation Age of Onset  . Schizophrenia Paternal Grandmother   . Diabetes Paternal Grandmother   . Mental illness Paternal Grandmother   . Hypertension Father   . Kidney disease Daughter     horse shoe kidneys recurrent UTI  . Anesthesia problems Neg Hx   . Hypertension Mother   . Heart disease Maternal Grandmother   . Liver disease Maternal Grandmother   . Heart disease Maternal Grandfather   . Liver disease Maternal Grandfather    History  Substance Use Topics  . Smoking status: Former Smoker -- 1.00 packs/day    Quit date: 09/07/2010  . Smokeless tobacco: Never Used  . Alcohol Use: No   OB History   Grav Para Term Preterm Abortions TAB SAB Ect  Mult Living   5 2 2  0 3 2 1  0 0 2     Review of Systems  Constitutional: Negative for fever.  Skin: Positive for rash. Negative for wound.      Allergies  Review of patient's allergies indicates no known allergies.  Home Medications   Prior to Admission medications   Medication Sig Start Date End Date Taking? Authorizing Provider  bacitracin ointment Apply 1 application topically 2 (two) times daily. Only if the rash is getting worse 11/30/13   Derwood KaplanAnkit Damonta Cossey, MD   BP 117/68  Pulse 56  Temp(Src) 98 F (36.7 C) (Oral)  Resp 18  SpO2 100% Physical Exam  Nursing note and vitals reviewed. Constitutional: She appears well-developed.  HENT:  Head: Atraumatic.  Pulmonary/Chest: Effort normal.  Neurological: She is alert.  Skin:  Right thigh - there is a skin lesion with surrounding erythema. The skin lesion looks like a puncture wound - with some dark material embedded in it.Grossly appears to be fry blood. We got a needle, and were able to remove some of the material off of the skin - and there was some bleeding underneath.    ED Course  Procedures (including critical care time) Labs Review Labs Reviewed - No data to display  Imaging Review No results found.   EKG Interpretation None  MDM   Final diagnoses:  Insect bite    Pt with insect bite. No abscess, no foreign body seen, no tick seen. Pt denies seeing any tick on her or feeling a bite. The lesion she is concerned about is essentially a puncture wound with dry scab. Will d/c. Return precautions have been discussed, she is to return if the symptoms are getting worse.    Derwood Kaplan, MD 11/30/13 0225

## 2013-11-30 NOTE — ED Notes (Signed)
[place]. Patient has two small puncture sites to the posterior left leg.

## 2014-03-07 ENCOUNTER — Encounter (HOSPITAL_COMMUNITY): Payer: Self-pay | Admitting: Emergency Medicine

## 2014-06-18 ENCOUNTER — Emergency Department: Payer: Self-pay | Admitting: Emergency Medicine

## 2014-09-22 ENCOUNTER — Ambulatory Visit: Payer: Medicaid Other | Admitting: Obstetrics & Gynecology

## 2014-10-12 ENCOUNTER — Ambulatory Visit: Payer: Medicaid Other | Admitting: Obstetrics & Gynecology

## 2014-10-18 ENCOUNTER — Encounter (HOSPITAL_COMMUNITY): Payer: Self-pay

## 2014-10-18 ENCOUNTER — Inpatient Hospital Stay (HOSPITAL_COMMUNITY)
Admission: AD | Admit: 2014-10-18 | Discharge: 2014-10-18 | Disposition: A | Discharge status: Home / Self Care | Payer: Medicaid Other | Source: Ambulatory Visit | Attending: Obstetrics and Gynecology | Admitting: Obstetrics and Gynecology

## 2014-10-18 DIAGNOSIS — Z87891 Personal history of nicotine dependence: Secondary | ICD-10-CM | POA: Insufficient documentation

## 2014-10-18 DIAGNOSIS — N63 Unspecified lump in breast: Secondary | ICD-10-CM | POA: Diagnosis not present

## 2014-10-18 DIAGNOSIS — N6313 Unspecified lump in the right breast, lower outer quadrant: Secondary | ICD-10-CM

## 2014-10-18 DIAGNOSIS — Z853 Personal history of malignant neoplasm of breast: Secondary | ICD-10-CM | POA: Insufficient documentation

## 2014-10-18 LAB — POCT PREGNANCY, URINE: Preg Test, Ur: NEGATIVE

## 2014-10-18 NOTE — MAU Provider Note (Signed)
History     CSN: [id number]  Arrival date and time: 10/18/14 0354   First Provider Initiated Contact with Patient 10/18/14 0435      Chief Complaint  Patient presents with  . Breast Problem   HPI Comments: Penny Becker is a 24 y.o. [id number] who presents today with a right breast lump. She states that it has been present for about 5 months, and it seems to have gotten bigger recently. She states that the area is non-tender, and denies any skin changes around the area. She reports a history a breast cancer in her PGM. She states that she was diagnoses in her 24's. She has an appointment on 11/02/14 in the clinic. She has not called her PCP about this concern. She states that her youngest child is 3, and she did not breastfeed.     Past Medical History  Diagnosis Date  . Anemia     1st pregnancy, tx'd with iron  . Abnormal Pap smear   . Subchorionic hemorrhage 10/22/10  . Prior pregnancy complicated by PIH, antepartum     during 1st pregnancy  . History of recurrent UTIs last one 2 months ago  . Chlamydia     Past Surgical History  Procedure Laterality Date  . Induced abortion      2010, 2011  . Dilation and curettage of uterus      Family History  Problem Relation Age of Onset  . Schizophrenia Paternal Grandmother   . Diabetes Paternal Grandmother   . Mental illness Paternal Grandmother   . Hypertension Father   . Kidney disease Daughter     horse shoe kidneys recurrent UTI  . Anesthesia problems Neg Hx   . Hypertension Mother   . Heart disease Maternal Grandmother   . Liver disease Maternal Grandmother   . Heart disease Maternal Grandfather   . Liver disease Maternal Grandfather     History  Substance Use Topics  . Smoking status: Former Smoker -- 1.00 packs/day    Quit date: 09/07/2010  . Smokeless tobacco: Never Used  . Alcohol Use: No    Allergies: No Known Allergies  Prescriptions prior to admission  Medication Sig Dispense Refill Last Dose  .  Etonogestrel (IMPLANON [place]) Inject into the skin.       Review of Systems  Constitutional: Negative for fever.  Gastrointestinal: Negative for nausea and vomiting.   Physical Exam   Blood pressure 131/78, pulse 76, temperature 98.1 F (36.7 C), temperature source Oral, resp. rate 16, height 5' 7.5" (1.715 m), weight 74.027 kg (163 lb 3.2 oz), SpO2 100 %.  Physical Exam  Nursing note and vitals reviewed. Constitutional: She is oriented to person, place, and time. She appears well-developed and well-nourished. No distress.  Cardiovascular: Normal rate.   Respiratory: Effort normal. Right breast exhibits mass. Right breast exhibits no inverted nipple, no nipple discharge, no skin change and no tenderness. Left breast exhibits no inverted nipple, no mass, no nipple discharge, no skin change and no tenderness. Breasts are symmetrical.    Pea sized, mobile mass at the 7:00 position on the right breast   GI: Soft. There is no tenderness. There is no rebound.  Neurological: She is alert and oriented to person, place, and time.  Skin: Skin is warm and dry.  Psychiatric: She has a normal mood and affect.    MAU Course  Procedures  MDM  Assessment and Plan   1. Breast lump on right side at 7 o'clock position  DC home RX: none  Return to MAU as needed FU with GYN clinic as planned [place] of right breast ordered, to be done prior to clinic appointment   Follow-up Information    Follow up with [facility].   Specialty:  Obstetrics and Gynecology   Why:  As scheduled for 11/02/14    Contact information:   [address] [place] [address] [phone]        Tawnya Crook 10/18/2014, 4:36 AM

## 2014-10-18 NOTE — Discharge Instructions (Signed)
Breast Cyst A breast cyst is a sac in the breast that is filled with fluid. Breast cysts are common in women. Women can have one or many cysts. When the breasts contain many cysts, it is usually due to a noncancerous (benign) condition called fibrocystic change. These lumps form under the influence of female hormones (estrogen and progesterone). The lumps are most often located in the upper, outer portion of the breast. They are often more swollen, painful, and tender before your period starts. They usually disappear after menopause, unless you are on hormone therapy.  There are several types of cysts:  Macrocyst. This is a cyst that is about 2 in. (5.1 cm) in diameter.   Microcyst. This is a tiny cyst that you cannot feel but can be seen with a mammogram or an ultrasound.   Galactocele. This is a cyst containing milk that may develop if you suddenly stop breastfeeding.   Sebaceous cyst of the skin. This type of cyst is not in the breast tissue itself. Breast cysts do not increase your risk of breast cancer. However, they must be monitored closely because they can be cancerous.  CAUSES  It is not known exactly what causes a breast cyst to form. Possible causes include:  An overgrowth of milk glands and connective tissue in the breast can block the milk glands, causing them to fill with fluid.   Scar tissue in the breast from previous surgery may block the glands, causing a cyst.  RISK FACTORS Estrogen may influence the development of a breast cyst.  SIGNS AND SYMPTOMS   Feeling a smooth, round, soft lump (like a grape) in the breast that is easily moveable.   Breast discomfort or pain.  Increase in size of the lump before your menstrual period and decrease in its size after your menstrual period.  DIAGNOSIS  A cyst can be felt during a physical exam by your health care provider. A breast X-ray exam (mammogram) and ultrasonography will be done to confirm the diagnosis. Fluid may  be removed from the cyst with a needle (fine needle aspiration) to make sure the cyst is not cancerous.  TREATMENT  Treatment may not be necessary. Your health care provider may monitor the cyst to see if it goes away on its own. If treatment is needed, it may include:  Hormone treatment.   Needle aspiration. There is a chance of the cyst coming back after aspiration.   Surgery to remove the whole cyst.  HOME CARE INSTRUCTIONS   Keep all follow-up appointments with your health care provider.  See your health care provider regularly:  Get a yearly exam by your health care provider.  Have a clinical breast exam by a health care provider every 1-3 years if you are 20-40 years of age. After age 40 years, you should have the exam every year.   Get mammogram tests as directed by your health care provider.   Understand the normal appearance and feel of your breasts and perform breast self-exams.   Only take over-the-counter or prescription medicines as directed by your health care provider.   Wear a supportive bra, especially when exercising.   Avoid caffeine.   Reduce your salt intake, especially before your menstrual period. Too much salt can cause fluid retention, breast swelling, and discomfort.  SEEK MEDICAL CARE IF:   You feel, or think you feel, a lump in your breast.   You notice that both breasts look or feel different than usual.   Your   breast is still causing pain after your menstrual period is over.   You need medicine for breast pain and swelling that occurs with your menstrual period.  SEEK IMMEDIATE MEDICAL CARE IF:   You have severe pain, tenderness, redness, or warmth in your breast.   You have nipple discharge or bleeding.   Your breast lump becomes hard and painful.   You find new lumps or bumps that were not there before.   You feel lumps in your armpit (axilla).   You notice dimpling or wrinkling of the breast or nipple.   You  have a fever.  MAKE SURE YOU:  Understand these instructions.  Will watch your condition.  Will get help right away if you are not doing well or get worse. Document Released: 04/22/2005 Document Revised: 12/23/2012 Document Reviewed: 11/19/2012 ExitCare Patient Information 2015 [employer]. This information is not intended to replace advice given to you by your health care provider. Make sure you discuss any questions you have with your health care provider.  

## 2014-10-18 NOTE — MAU Note (Signed)
Lump in right breast for 5 months; has gotten bigger and was concerned.  Implanon was due to be taken out in Jan 2016; irregular bleeding.

## 2014-11-02 ENCOUNTER — Ambulatory Visit (INDEPENDENT_AMBULATORY_CARE_PROVIDER_SITE_OTHER): Payer: Medicaid Other | Admitting: Obstetrics & Gynecology

## 2014-11-02 ENCOUNTER — Telehealth: Payer: Self-pay | Admitting: *Deleted

## 2014-11-02 ENCOUNTER — Encounter: Payer: Self-pay | Admitting: Obstetrics & Gynecology

## 2014-11-02 ENCOUNTER — Other Ambulatory Visit: Payer: Self-pay | Admitting: Advanced Practice Midwife

## 2014-11-02 VITALS — BP 124/72 | HR 78 | Temp 98.3°F | Ht 67.5 in | Wt 163.3 lb

## 2014-11-02 DIAGNOSIS — Z3049 Encounter for surveillance of other contraceptives: Secondary | ICD-10-CM | POA: Diagnosis not present

## 2014-11-02 DIAGNOSIS — Z30017 Encounter for initial prescription of implantable subdermal contraceptive: Secondary | ICD-10-CM

## 2014-11-02 DIAGNOSIS — Z3046 Encounter for surveillance of implantable subdermal contraceptive: Secondary | ICD-10-CM

## 2014-11-02 MED ORDER — ETONOGESTREL 68 MG ~~LOC~~ IMPL
68.0000 mg | DRUG_IMPLANT | Freq: Once | SUBCUTANEOUS | Status: AC
  Administered 2014-11-02: 68 mg via SUBCUTANEOUS

## 2014-11-02 NOTE — Patient Instructions (Signed)
Etonogestrel implant What is this medicine? ETONOGESTREL (et oh noe JES trel) is a contraceptive (birth control) device. It is used to prevent pregnancy. It can be used for up to 3 years. This medicine may be used for other purposes; ask your health care provider or pharmacist if you have questions. COMMON BRAND NAME(S): Implanon, Nexplanon What should I tell my health care provider before I take this medicine? They need to know if you have any of these conditions: -abnormal vaginal bleeding -blood vessel disease or blood clots -cancer of the breast, cervix, or liver -depression -diabetes -gallbladder disease -headaches -heart disease or recent heart attack -high blood pressure -high cholesterol -kidney disease -liver disease -renal disease -seizures -tobacco smoker -an unusual or allergic reaction to etonogestrel, other hormones, anesthetics or antiseptics, medicines, foods, dyes, or preservatives -pregnant or trying to get pregnant -breast-feeding How should I use this medicine? This device is inserted just under the skin on the inner side of your upper arm by a health care professional. Talk to your pediatrician regarding the use of this medicine in children. Special care may be needed. Overdosage: If you think you've taken too much of this medicine contact a poison control center or emergency room at once. Overdosage: If you think you have taken too much of this medicine contact a poison control center or emergency room at once. NOTE: This medicine is only for you. Do not share this medicine with others. What if I miss a dose? This does not apply. What may interact with this medicine? Do not take this medicine with any of the following medications: -amprenavir -bosentan -fosamprenavir This medicine may also interact with the following medications: -barbiturate medicines for inducing sleep or treating seizures -certain medicines for fungal infections like ketoconazole and  itraconazole -griseofulvin -medicines to treat seizures like carbamazepine, felbamate, oxcarbazepine, phenytoin, topiramate -modafinil -phenylbutazone -rifampin -some medicines to treat HIV infection like atazanavir, indinavir, lopinavir, nelfinavir, tipranavir, ritonavir -St. John's wort This list may not describe all possible interactions. Give your health care provider a list of all the medicines, herbs, non-prescription drugs, or dietary supplements you use. Also tell them if you smoke, drink alcohol, or use illegal drugs. Some items may interact with your medicine. What should I watch for while using this medicine? This product does not protect you against HIV infection (AIDS) or other sexually transmitted diseases. You should be able to feel the implant by pressing your fingertips over the skin where it was inserted. Tell your doctor if you cannot feel the implant. What side effects may I notice from receiving this medicine? Side effects that you should report to your doctor or health care professional as soon as possible: -allergic reactions like skin rash, itching or hives, swelling of the face, lips, or tongue -breast lumps -changes in vision -confusion, trouble speaking or understanding -dark urine -depressed mood -general ill feeling or flu-like symptoms -light-colored stools -loss of appetite, nausea -right upper belly pain -severe headaches -severe pain, swelling, or tenderness in the abdomen -shortness of breath, chest pain, swelling in a leg -signs of pregnancy -sudden numbness or weakness of the face, arm or leg -trouble walking, dizziness, loss of balance or coordination -unusual vaginal bleeding, discharge -unusually weak or tired -yellowing of the eyes or skin Side effects that usually do not require medical attention (Report these to your doctor or health care professional if they continue or are bothersome.): -acne -breast pain -changes in  weight -cough -fever or chills -headache -irregular menstrual bleeding -itching, burning, and   vaginal discharge -pain or difficulty passing urine -sore throat This list may not describe all possible side effects. Call your doctor for medical advice about side effects. You may report side effects to FDA at 1-800-FDA-1088. Where should I keep my medicine? This drug is given in a hospital or clinic and will not be stored at home. NOTE: This sheet is a summary. It may not cover all possible information. If you have questions about this medicine, talk to your doctor, pharmacist, or health care provider.  2015, Elsevier/[employer] Standard. (2011-10-28 15:37:45)  

## 2014-11-02 NOTE — Progress Notes (Signed)
Needs breast ultrasound. Informed patient [place] she will need to call her provider assigned on her card and get them to schedule it. If we need to send any notes we will. Srija voices understanding.

## 2014-11-02 NOTE — Telephone Encounter (Signed)
Called Tresa EndoKelly and notified her we need a copy of her medicaid card so that medicaid can be billed for her nexplanon; otherwise she will receive a bill for nexplanon insertion.  She voices understanding.

## 2014-11-02 NOTE — Progress Notes (Signed)
[id number] No LMP recorded (lmp unknown). Patient has had an implant. Nexplanon since 08/2011 for removal and replacement  Patient given informed consent, signed copy in the chart, time out was performed. Pregnancy test was not performed, Nexplanon in place.  Appropriate time out taken.  Patient's left arm was prepped and draped in the usual sterile fashion.The device was palpated .  Pt was prepped with alcohol swab and then injected with 3 cc of 1 % lidocaine.  Pt was prepped with betadine,  15 blade was used to incise at the tip of the device and the capsule was removed intact. Nexplanon removed form packaging. Then inserted per standard guidelines. Patient and provider were able to palpate rod under skin. Pt insertion site covered with sterile dressing.   Minimal blood loss.  Pt tolerated the procedure well.   Adam PhenixJames G Andranik Jeune, MD 11/02/2014

## 2014-11-15 ENCOUNTER — Other Ambulatory Visit: Payer: Self-pay

## 2015-02-15 ENCOUNTER — Telehealth: Payer: Self-pay | Admitting: General Practice

## 2015-02-15 DIAGNOSIS — R110 Nausea: Secondary | ICD-10-CM

## 2015-02-15 MED ORDER — ONDANSETRON HCL 4 MG PO TABS: 4.0000 mg | ORAL_TABLET | Freq: Three times a day (TID) | ORAL | Status: DC | PRN

## 2015-02-15 NOTE — Telephone Encounter (Signed)
Patient called and left message stating she had her nexplanon removed and reinserted back in June. Patient states she is having symptoms and wants to know if she can take something for it. Patient states she is having bloating, nausea and generally does not feel well. Patient states she hasn't had bleeding since the initial insertion but knows this can be normal. Patient states she just cannot go on feeling like this. Called patient, no answer- unable to leave message due to voicemail box full

## 2015-02-15 NOTE — Telephone Encounter (Signed)
Patient called back and states she has been feeling nauseous, bloated, and moody for the past 2 months. Asked patient if she has taken a pregnancy test. Patient states yes and they are all negative. Asked patient if she was having regular bowel movements and she states yes. Told patient her symptoms are not common and would not be expected since she has had this method before. Told patient that the device is still "new" for the first 6 months so it could just be her body adjusting. Told patient she can see how things go over the next couple months and for now we can send something in for nausea. Patient verbalized understanding and is agreeable to plan. Patient had no other questions at this time

## 2015-02-22 ENCOUNTER — Inpatient Hospital Stay (HOSPITAL_COMMUNITY)
Admission: AD | Admit: 2015-02-22 | Discharge: 2015-02-22 | Disposition: A | Discharge status: Home / Self Care | Payer: Medicaid Other | Source: Ambulatory Visit | Attending: Obstetrics & Gynecology | Admitting: Obstetrics & Gynecology

## 2015-02-22 DIAGNOSIS — Z793 Long term (current) use of hormonal contraceptives: Secondary | ICD-10-CM | POA: Diagnosis not present

## 2015-02-22 DIAGNOSIS — R110 Nausea: Secondary | ICD-10-CM

## 2015-02-22 DIAGNOSIS — F17200 Nicotine dependence, unspecified, uncomplicated: Secondary | ICD-10-CM | POA: Insufficient documentation

## 2015-02-22 DIAGNOSIS — N912 Amenorrhea, unspecified: Secondary | ICD-10-CM | POA: Insufficient documentation

## 2015-02-22 DIAGNOSIS — R030 Elevated blood-pressure reading, without diagnosis of hypertension: Secondary | ICD-10-CM | POA: Diagnosis not present

## 2015-02-22 DIAGNOSIS — IMO0001 Reserved for inherently not codable concepts without codable children: Secondary | ICD-10-CM

## 2015-02-22 DIAGNOSIS — Z975 Presence of (intrauterine) contraceptive device: Secondary | ICD-10-CM

## 2015-02-22 LAB — URINALYSIS, ROUTINE W REFLEX MICROSCOPIC
Bilirubin Urine: NEGATIVE
Glucose, UA: NEGATIVE mg/dL
Hgb urine dipstick: NEGATIVE
Ketones, ur: NEGATIVE mg/dL
Leukocytes, UA: NEGATIVE
Nitrite: NEGATIVE
Protein, ur: NEGATIVE mg/dL
Specific Gravity, Urine: 1.025 (ref 1.005–1.030)
Urobilinogen, UA: 0.2 mg/dL (ref 0.0–1.0)
pH: 6.5 (ref 5.0–8.0)

## 2015-02-22 LAB — HCG, SERUM, QUALITATIVE: Preg, Serum: NEGATIVE

## 2015-02-22 LAB — POCT PREGNANCY, URINE: Preg Test, Ur: NEGATIVE

## 2015-02-22 MED ORDER — ONDANSETRON HCL 4 MG PO TABS: 4.0000 mg | ORAL_TABLET | Freq: Three times a day (TID) | ORAL | Status: DC | PRN

## 2015-02-22 NOTE — MAU Note (Signed)
Pt states for past few months has had N/V. Has had some high BP. Has nexplanon-placed a few months ago. Took UPT at home which was negative.

## 2015-02-22 NOTE — MAU Provider Note (Signed)
History     CSN: [id number]  Arrival date and time: 02/22/15 [id number]   First Provider Initiated Contact with Patient 02/22/15 0143      No chief complaint on file.  HPI  Penny Becker is a 24 y.o. [id number] who presents to MAU today with multiple complaints. The patient states that she has had nausea without vomiting, diarrhea or constipation for months. She denies fever. She states that a few months ago she had a Nexplanon removed and a new one placed on the same day. She states that with last Nexplanon she had regular light periods. She denies vaginal bleeding since insertion of new Nexplanon. She has had negative HPTs recently, but is requesting a blood hCG test because she had negative UPT with last pregnancy and then + hCG serum. 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 She has never had HTN outside of pregnancy before recently. She does not have a PCP because [place] and she doesn't want to drive there for appointments. She denies headache, chest pain or blurred vision. She is not on medication for her blood pressure.   OB History    Gravida Para Term Preterm AB TAB SAB Ectopic Multiple Living   5 2 2  0 3 2 1  0 0 2      Past Medical History  Diagnosis Date  . Anemia     1st pregnancy, tx'd with iron  . Abnormal Pap smear   . Subchorionic hemorrhage 10/22/10  . Prior pregnancy complicated by PIH, antepartum     during 1st pregnancy  . History of recurrent UTIs last one 2 months ago  . Chlamydia     Past Surgical History  Procedure Laterality Date  . Induced abortion      2010, 2011  . Dilation and curettage of uterus      Family History  Problem Relation Age of Onset  . Schizophrenia Paternal Grandmother   . Diabetes Paternal Grandmother   . Mental illness Paternal Grandmother   . Hypertension Father   . Kidney disease Daughter     horse  shoe kidneys recurrent UTI  . Anesthesia problems Neg Hx   . Hypertension Mother   . Heart disease Maternal Grandmother   . Liver disease Maternal Grandmother   . Heart disease Maternal Grandfather   . Liver disease Maternal Grandfather     Social History  Substance Use Topics  . Smoking status: Current Every Day Smoker -- 1.00 packs/day  . Smokeless tobacco: Never Used  . Alcohol Use: No    Allergies: No Known Allergies  Prescriptions prior to admission  Medication Sig Dispense Refill Last Dose  . Aspirin-Salicylamide-Caffeine (BC HEADACHE POWDER PO) Take 1 Package by mouth as needed.   Taking  . Etonogestrel (IMPLANON [place]) Inject into the skin.     Marland Kitchen. ibuprofen (ADVIL,MOTRIN) 200 MG tablet Take 200 mg by mouth every 6 (six) hours as needed.   Taking  . [DISCONTINUED] ondansetron (ZOFRAN) 4 MG tablet Take 1 tablet (4 mg total) by mouth every 8 (eight) hours as needed for nausea or vomiting. 30 tablet 1     Review of Systems  Constitutional: Negative for fever and malaise/fatigue.  Gastrointestinal: Positive for nausea. Negative for vomiting, abdominal pain, diarrhea and constipation.  Genitourinary: Negative for dysuria, urgency and frequency.   Physical Exam   Blood pressure 143/80,  pulse 60, temperature 98.2 F (36.8 C), temperature source Oral, resp. rate 18, height  (1.702 m), weight 168 lb 9.6 oz (76.476 kg), SpO2 100 %.  Physical Exam  Nursing note and vitals reviewed. Constitutional: She is oriented to person, place, and time. She appears well-developed and well-nourished. No distress.  HENT:  Head: Normocephalic and atraumatic.  Cardiovascular: Normal rate.   Respiratory: Effort normal.  GI: Soft. She exhibits no distension.  Neurological: She is alert and oriented to person, place, and time.  Skin: Skin is warm and dry. No erythema.  Psychiatric: She has a normal mood and affect.     Results for orders placed or performed during the hospital encounter of  02/22/15 (from the past 24 hour(s))  Urinalysis, Routine w reflex microscopic (not at [facility])     Status: None   Collection Time: 02/22/15  1:37 AM  Result Value Ref Range   Color, Urine YELLOW YELLOW   APPearance CLEAR CLEAR   Specific Gravity, Urine 1.025 1.005 - 1.030   pH 6.5 5.0 - 8.0   Glucose, UA NEGATIVE NEGATIVE mg/dL   Hgb urine dipstick NEGATIVE NEGATIVE   Bilirubin Urine NEGATIVE NEGATIVE   Ketones, ur NEGATIVE NEGATIVE mg/dL   Protein, ur NEGATIVE NEGATIVE mg/dL   Urobilinogen, UA 0.2 0.0 - 1.0 mg/dL   Nitrite NEGATIVE NEGATIVE   Leukocytes, UA NEGATIVE NEGATIVE  Pregnancy, urine POC     Status: None   Collection Time: 02/22/15  1:41 AM  Result Value Ref Range   Preg Test, Ur NEGATIVE NEGATIVE  hCG, serum, qualitative     Status: None   Collection Time: 02/22/15  1:56 AM  Result Value Ref Range   Preg, Serum NEGATIVE NEGATIVE    MAU Course  Procedures None  MDM UPT - negative Serum hCG - negative UA normal  Assessment and Plan  A: Elevated blood pressure, mild Amenorrhea with Nexplanon Nausea without vomiting  P: Discharge home Rx for Zofran sent to patient's pharmacy Warning signs for worsening condition discussed Patient advised to follow-up with PCP of choice for management of chronic non-GYN issues. Contact information for [employer] [facility] given Patient advised that she will need to fix [employer] card to establish PCP care in [place].  Patient may return to MAU as needed or if her condition were to change or worsen, however for non-GYN issues patient advised to present to [facility] or [facility]   Marny Lowenstein, PA-C  02/22/2015, 2:53 AM

## 2015-02-22 NOTE — Discharge Instructions (Signed)
Food Choices,Adult When you have diarrhea or upset stomach, the foods you eat and your eating habits are very important. Choosing the right foods and drinks can help relieve diarrhea. Also, because diarrhea can last up to 7 days, you need to replace lost fluids and electrolytes (such as sodium, potassium, and chloride) in order to help prevent dehydration.  WHAT GENERAL GUIDELINES DO I NEED TO FOLLOW?  Slowly drink 1 cup (8 oz) of fluid for each episode of diarrhea. If you are getting enough fluid, your urine will be clear or pale yellow.  Eat starchy foods. Some good choices include white rice, white toast, pasta, low-fiber cereal, baked potatoes (without the skin), saltine crackers, and bagels.  Avoid large servings of any cooked vegetables.  Limit fruit to two servings per day. A serving is  cup or 1 small piece.  Choose foods with less than 2 g of fiber per serving.  Limit fats to less than 8 tsp (38 g) per day.  Avoid fried foods.  Eat foods that have probiotics in them. Probiotics can be found in certain dairy products.  Avoid foods and beverages that may increase the speed at which food moves through the stomach and intestines (gastrointestinal tract). Things to avoid include:  High-fiber foods, such as dried fruit, raw fruits and vegetables, nuts, seeds, and whole grain foods.  Spicy foods and high-fat foods.  Foods and beverages sweetened with high-fructose corn syrup, honey, or sugar alcohols such as xylitol, sorbitol, and mannitol. WHAT FOODS ARE RECOMMENDED? Grains White rice. White, [place], or pita breads (fresh or toasted), including plain rolls, buns, or bagels. White pasta. Saltine, soda, or graham crackers. Pretzels. Low-fiber cereal. Cooked cereals made with water (such as cornmeal, farina, or cream cereals). Plain muffins. Matzo. Melba toast. Zwieback.  Vegetables Potatoes (without the skin). Strained tomato and vegetable juices. Most well-cooked and canned  vegetables without seeds. Tender lettuce. Fruits Cooked or canned applesauce, apricots, cherries, fruit cocktail, grapefruit, peaches, pears, or plums. Fresh bananas, apples without skin, cherries, grapes, cantaloupe, grapefruit, peaches, oranges, or plums.  Meat and Other Protein Products Baked or boiled chicken. Eggs. Tofu. Fish. Seafood. Smooth peanut butter. Ground or well-cooked tender beef, ham, veal, lamb, pork, or poultry.  Dairy Plain yogurt, kefir, and unsweetened liquid yogurt. Lactose-free milk, buttermilk, or soy milk. Plain hard cheese. Beverages Sport drinks. Clear broths. Diluted fruit juices (except prune). Regular, caffeine-free sodas such as ginger ale. Water. Decaffeinated teas. Oral rehydration solutions. Sugar-free beverages not sweetened with sugar alcohols. Other Bouillon, broth, or soups made from recommended foods.  The items listed above may not be a complete list of recommended foods or beverages. Contact your dietitian for more options. WHAT FOODS ARE NOT RECOMMENDED? Grains Whole grain, whole wheat, bran, or rye breads, rolls, pastas, crackers, and cereals. Wild or brown rice. Cereals that contain more than 2 g of fiber per serving. Corn tortillas or taco shells. Cooked or dry oatmeal. Granola. Popcorn. Vegetables Raw vegetables. Cabbage, broccoli, Brussels sprouts, artichokes, baked beans, beet greens, corn, kale, legumes, peas, sweet potatoes, and yams. Potato skins. Cooked spinach and cabbage. Fruits Dried fruit, including raisins and dates. Raw fruits. Stewed or dried prunes. Fresh apples with skin, apricots, mangoes, pears, raspberries, and strawberries.  Meat and Other Protein Products Chunky peanut butter. Nuts and seeds. Beans and lentils. Tomasa Blase.  Dairy High-fat cheeses. Milk, chocolate milk, and beverages made with milk, such as milk shakes. Cream. Ice cream. Sweets and Desserts Sweet rolls, doughnuts, and sweet breads. Pancakes and  waffles. Fats and  Oils Butter. Cream sauces. Margarine. Salad oils. Plain salad dressings. Olives. Avocados.  Beverages Caffeinated beverages (such as coffee, tea, soda, or energy drinks). Alcoholic beverages. Fruit juices with pulp. Prune juice. Soft drinks sweetened with high-fructose corn syrup or sugar alcohols. Other Coconut. Hot sauce. Chili powder. Mayonnaise. Gravy. Cream-based or milk-based soups.  The items listed above may not be a complete list of foods and beverages to avoid. Contact your dietitian for more information. WHAT SHOULD I DO IF I BECOME DEHYDRATED? Diarrhea can sometimes lead to dehydration. Signs of dehydration include dark urine and dry mouth and skin. If you think you are dehydrated, you should rehydrate with an oral rehydration solution. These solutions can be purchased at pharmacies, retail stores, or online.  Drink -1 cup (120-240 mL) of oral rehydration solution each time you have an episode of diarrhea. If drinking this amount makes your diarrhea worse, try drinking smaller amounts more often. For example, drink 1-3 tsp (5-15 mL) every 5-10 minutes.  A general rule for staying hydrated is to drink 1-2 L of fluid per day. Talk to your health care provider about the specific amount you should be drinking each day. Drink enough fluids to keep your urine clear or pale yellow.   This information is not intended to replace advice given to you by your health care provider. Make sure you discuss any questions you have with your health care provider.   Document Released: 07/13/2003 Document Revised: 05/13/2014 Document Reviewed: 03/15/2013 Elsevier Interactive Patient Education 2016 Elsevier Inc. Nausea, Adult Nausea means you feel sick to your stomach or need to throw up (vomit). It may be a sign of a more serious problem. If nausea gets worse, you may throw up. If you throw up a lot, you may lose too much body fluid (dehydration). HOME CARE   Get plenty of rest.  Ask your doctor how  to replace body fluid losses (rehydrate).  Eat small amounts of food. Sip liquids more often.  Take all medicines as told by your doctor. GET HELP RIGHT AWAY IF:  You have a fever.  You pass out (faint).  You keep throwing up or have blood in your throw up.  You are very weak, have dry lips or a dry mouth, or you are very thirsty (dehydrated).  You have dark or bloody poop (stool).  You have very bad chest or belly (abdominal) pain.  You do not get better after 2 days, or you get worse.  You have a headache. MAKE SURE YOU:  Understand these instructions.  Will watch your condition.  Will get help right away if you are not doing well or get worse.   This information is not intended to replace advice given to you by your health care provider. Make sure you discuss any questions you have with your health care provider.   Document Released: 04/11/2011 Document Revised: 07/15/2011 Document Reviewed: 04/11/2011 Elsevier Interactive Patient Education 2016 [employer]. Hypertension Hypertension, commonly called high blood pressure, is when the force of blood pumping through your arteries is too strong. Your arteries are the blood vessels that carry blood from your heart throughout your body. A blood pressure reading consists of a higher number over a lower number, such as 110/72. The higher number (systolic) is the pressure inside your arteries when your heart pumps. The lower number (diastolic) is the pressure inside your arteries when your heart relaxes. Ideally you want your blood pressure below 120/80. Hypertension forces your heart  to work harder to pump blood. Your arteries may become narrow or stiff. Having untreated or uncontrolled hypertension can cause heart attack, stroke, kidney disease, and other problems. RISK FACTORS Some risk factors for high blood pressure are controllable. Others are not.  Risk factors you cannot control include:   Race. You may be at higher risk  if you are African American.  Age. Risk increases with age.  Gender. Men are at higher risk than women before age 26 years. After age 26, women are at higher risk than men. Risk factors you can control include:  Not getting enough exercise or physical activity.  Being overweight.  Getting too much fat, sugar, calories, or salt in your diet.  Drinking too much alcohol. SIGNS AND SYMPTOMS Hypertension does not usually cause signs or symptoms. Extremely high blood pressure (hypertensive crisis) may cause headache, anxiety, shortness of breath, and nosebleed. DIAGNOSIS To check if you have hypertension, your health care provider will measure your blood pressure while you are seated, with your arm held at the level of your heart. It should be measured at least twice using the same arm. Certain conditions can cause a difference in blood pressure between your right and left arms. A blood pressure reading that is higher than normal on one occasion does not mean that you need treatment. If it is not clear whether you have high blood pressure, you may be asked to return on a different day to have your blood pressure checked again. Or, you may be asked to monitor your blood pressure at home for 1 or more weeks. TREATMENT Treating high blood pressure includes making lifestyle changes and possibly taking medicine. Living a healthy lifestyle can help lower high blood pressure. You may need to change some of your habits. Lifestyle changes may include:  Following the DASH diet. This diet is high in fruits, vegetables, and whole grains. It is low in salt, red meat, and added sugars.  Keep your sodium intake below 2,300 mg per day.  Getting at least 30-45 minutes of aerobic exercise at least 4 times per week.  Losing weight if necessary.  Not smoking.  Limiting alcoholic beverages.  Learning ways to reduce stress. Your health care provider may prescribe medicine if lifestyle changes are not enough  to get your blood pressure under control, and if one of the following is true:  You are 7318-26 years of age and your systolic blood pressure is above 140.  You are 26 years of age or older, and your systolic blood pressure is above 150.  Your diastolic blood pressure is above 90.  You have diabetes, and your systolic blood pressure is over 140 or your diastolic blood pressure is over 90.  You have kidney disease and your blood pressure is above 140/90.  You have heart disease and your blood pressure is above 140/90. Your personal target blood pressure may vary depending on your medical conditions, your age, and other factors. HOME CARE INSTRUCTIONS  Have your blood pressure rechecked as directed by your health care provider.   Take medicines only as directed by your health care provider. Follow the directions carefully. Blood pressure medicines must be taken as prescribed. The medicine does not work as well when you skip doses. Skipping doses also puts you at risk for problems.  Do not smoke.   Monitor your blood pressure at home as directed by your health care provider. SEEK MEDICAL CARE IF:   You think you are having a reaction to  medicines taken.  You have recurrent headaches or feel dizzy.  You have swelling in your ankles.  You have trouble with your vision. SEEK IMMEDIATE MEDICAL CARE IF:  You develop a severe headache or confusion.  You have unusual weakness, numbness, or feel faint.  You have severe chest or abdominal pain.  You vomit repeatedly.  You have trouble breathing. MAKE SURE YOU:   Understand these instructions.  Will watch your condition.  Will get help right away if you are not doing well or get worse.   This information is not intended to replace advice given to you by your health care provider. Make sure you discuss any questions you have with your health care provider.   Document Released: 04/22/2005 Document Revised: 09/06/2014 Document  Reviewed: 02/12/2013 Elsevier Interactive Patient Education [employer]

## 2015-04-18 ENCOUNTER — Encounter: Payer: Self-pay | Admitting: Emergency Medicine

## 2015-04-18 ENCOUNTER — Emergency Department
Admission: EM | Admit: 2015-04-18 | Discharge: 2015-04-18 | Disposition: A | Discharge status: Home / Self Care | Payer: Medicaid Other | Attending: Emergency Medicine | Admitting: Emergency Medicine

## 2015-04-18 ENCOUNTER — Telehealth: Payer: Self-pay | Admitting: *Deleted

## 2015-04-18 DIAGNOSIS — N939 Abnormal uterine and vaginal bleeding, unspecified: Secondary | ICD-10-CM | POA: Diagnosis not present

## 2015-04-18 DIAGNOSIS — N6489 Other specified disorders of breast: Secondary | ICD-10-CM | POA: Insufficient documentation

## 2015-04-18 DIAGNOSIS — F17200 Nicotine dependence, unspecified, uncomplicated: Secondary | ICD-10-CM | POA: Insufficient documentation

## 2015-04-18 DIAGNOSIS — Z79899 Other long term (current) drug therapy: Secondary | ICD-10-CM | POA: Diagnosis not present

## 2015-04-18 DIAGNOSIS — Z3202 Encounter for pregnancy test, result negative: Secondary | ICD-10-CM | POA: Insufficient documentation

## 2015-04-18 LAB — CBC WITH DIFFERENTIAL/PLATELET
Basophils Absolute: 0 10*3/uL (ref 0–0.1)
Basophils Relative: 1 %
Eosinophils Absolute: 0.1 10*3/uL (ref 0–0.7)
Eosinophils Relative: 1 %
HCT: 39.7 % (ref 35.0–47.0)
Hemoglobin: 13.5 g/dL (ref 12.0–16.0)
Lymphocytes Relative: 36 %
Lymphs Abs: 2.5 10*3/uL (ref 1.0–3.6)
MCH: 31 pg (ref 26.0–34.0)
MCHC: 33.9 g/dL (ref 32.0–36.0)
MCV: 91.4 fL (ref 80.0–100.0)
Monocytes Absolute: 0.5 10*3/uL (ref 0.2–0.9)
Monocytes Relative: 7 %
Neutro Abs: 3.8 10*3/uL (ref 1.4–6.5)
Neutrophils Relative %: 55 %
Platelets: 219 10*3/uL (ref 150–440)
RBC: 4.34 MIL/uL (ref 3.80–5.20)
RDW: 12.5 % (ref 11.5–14.5)
WBC: 6.9 10*3/uL (ref 3.6–11.0)

## 2015-04-18 LAB — URINALYSIS COMPLETE WITH MICROSCOPIC (ARMC ONLY)
Bacteria, UA: NONE SEEN
Bilirubin Urine: NEGATIVE
Glucose, UA: NEGATIVE mg/dL
Ketones, ur: NEGATIVE mg/dL
Leukocytes, UA: NEGATIVE
Nitrite: NEGATIVE
Protein, ur: NEGATIVE mg/dL
Specific Gravity, Urine: 1.024 (ref 1.005–1.030)
pH: 5 (ref 5.0–8.0)

## 2015-04-18 LAB — BASIC METABOLIC PANEL
Anion gap: 6 (ref 5–15)
BUN: 13 mg/dL (ref 6–20)
CO2: 27 mmol/L (ref 22–32)
Calcium: 9 mg/dL (ref 8.9–10.3)
Chloride: 106 mmol/L (ref 101–111)
Creatinine, Ser: 0.64 mg/dL (ref 0.44–1.00)
GFR calc Af Amer: >60 mL/min (ref >60)
GFR calc non Af Amer: >60 mL/min (ref >60)
Glucose, Bld: 96 mg/dL (ref 65–99)
Potassium: 3.7 mmol/L (ref 3.5–5.1)
Sodium: 139 mmol/L (ref 135–145)

## 2015-04-18 LAB — POCT PREGNANCY, URINE: Preg Test, Ur: NEGATIVE

## 2015-04-18 MED ORDER — TRAMADOL HCL 50 MG PO TABS: 50.0000 mg | ORAL_TABLET | Freq: Four times a day (QID) | ORAL | Status: DC | PRN

## 2015-04-18 NOTE — ED Notes (Signed)
Patient has implant from birth control, place 11/2014.  Noticed ? Implant bent.  [facility]  Noticed Vaginal Bleeding about 2 days ago.

## 2015-04-18 NOTE — Telephone Encounter (Signed)
Pt called and stated that she needs to be seen as soon as possible because her nexplanon is breaking in half. Patient states that she can feel it through the skin and shes starting to have bleeding and mood swings. Appointment made for Thursday at 1 pm. Pt agreeable to this.

## 2015-04-18 NOTE — ED Provider Notes (Signed)
[facility] Emergency Department Provider Note    ____________________________________________  Time seen: 1825  I have reviewed the triage vital signs and the nursing notes.   HISTORY  Chief Complaint Vaginal Bleeding   History limited by: Not Limited   HPI Penny Becker is a 24 y.o. female who presents to the emergency department today because of concerns for a bent Implanon and vaginal bleeding, cramping and breast discomfort. Patient states she had the Implanon placed roughly 5 months ago. She had been doing okay on it. She states for the past 3 days she has been having vaginal bleeding and cramping. She states that the pain will become severe especially at night. It is located in the lower abdomen bilateral pelvis. Patient states that in addition she has had breast discomfort. The patient denies any fevers. She denies any chest pain or shortness breath.  Past Medical History  Diagnosis Date  . Anemia     1st pregnancy, tx'd with iron  . Abnormal Pap smear   . Subchorionic hemorrhage 10/22/10  . Prior pregnancy complicated by PIH, antepartum     during 1st pregnancy  . History of recurrent UTIs last one 2 months ago  . Chlamydia     Patient Active Problem List   Diagnosis Date Noted  . History of recurrent UTIs 01/23/2011  . Hemorrhage 01/23/2011    Past Surgical History  Procedure Laterality Date  . Induced abortion      2010, 2011  . Dilation and curettage of uterus      Current Outpatient Rx  Name  Route  Sig  Dispense  Refill  . Aspirin-Salicylamide-Caffeine (BC HEADACHE POWDER PO)   Oral   Take 1 Package by mouth as needed.         . Etonogestrel (IMPLANON Anderson)   Subcutaneous   Inject into the skin.         Marland Kitchen. ondansetron (ZOFRAN) 4 MG tablet   Oral   Take 1 tablet (4 mg total) by mouth every 8 (eight) hours as needed for nausea or vomiting.   30 tablet   0     Allergies Review of patient's allergies indicates no  known allergies.  Family History  Problem Relation Age of Onset  . Schizophrenia Paternal Grandmother   . Diabetes Paternal Grandmother   . Mental illness Paternal Grandmother   . Hypertension Father   . Kidney disease Daughter     horse shoe kidneys recurrent UTI  . Anesthesia problems Neg Hx   . Hypertension Mother   . Heart disease Maternal Grandmother   . Liver disease Maternal Grandmother   . Heart disease Maternal Grandfather   . Liver disease Maternal Grandfather     Social History Social History  Substance Use Topics  . Smoking status: Current Every Day Smoker -- 1.00 packs/day  . Smokeless tobacco: Never Used  . Alcohol Use: No    Review of Systems  Constitutional: Negative for fever. Cardiovascular: Negative for chest pain. Respiratory: Negative for shortness of breath. Gastrointestinal: Negative for abdominal pain, vomiting and diarrhea. Genitourinary: Negative for dysuria. Musculoskeletal: Negative for back pain. Skin: Negative for rash. Neurological: Negative for headaches, focal weakness or numbness.  10-point ROS otherwise negative.  ____________________________________________   PHYSICAL EXAM:  VITAL SIGNS: ED Triage Vitals  Enc Vitals Group     BP 04/18/15 1648 127/76 mmHg     Pulse Rate 04/18/15 1648 73     Resp 04/18/15 1648 16     Temp  04/18/15 1648 98.1 F (36.7 C)     Temp Source 04/18/15 1648 Oral     SpO2 --      Weight 04/18/15 1648 168 lb (76.204 kg)     Height 04/18/15 1648  (1.702 m)     Head Cir --      Peak Flow --      Pain Score 04/18/15 1649 6   Constitutional: Alert and oriented. Well appearing and in no distress. Eyes: Conjunctivae are normal. PERRL. Normal extraocular movements. ENT   Head: Normocephalic and atraumatic.   Nose: No congestion/rhinnorhea.   Mouth/Throat: Mucous membranes are moist.   Neck: No stridor. Hematological/Lymphatic/Immunilogical: No cervical  lymphadenopathy. Cardiovascular: Normal rate, regular rhythm.  No murmurs, rubs, or gallops. Respiratory: Normal respiratory effort without tachypnea nor retractions. Breath sounds are clear and equal bilaterally. No wheezes/rales/rhonchi. Gastrointestinal: Soft and nontender. No distention. There is no CVA tenderness. Genitourinary: Deferred Musculoskeletal: Normal range of motion in all extremities. No joint effusions.  No lower extremity tenderness nor edema. Neurologic:  Normal speech and language. No gross focal neurologic deficits are appreciated.  Skin:  Skin is warm, dry and intact. No rash noted. Implanon felt in the inner left upper arm. It does feel potentially slightly bent however does not feel to be indiscrete pieces. No skin changes overlying the area. Psychiatric: Mood and affect are normal. Speech and behavior are normal. Patient exhibits appropriate insight and judgment.  ____________________________________________    LABS (pertinent positives/negatives)  Labs Reviewed  URINALYSIS COMPLETEWITH MICROSCOPIC ([facility] ONLY) - Abnormal; Notable for the following:    Color, Urine YELLOW (*)    APPearance HAZY (*)    Hgb urine dipstick 3+ (*)    Squamous Epithelial / LPF 0-5 (*)    All other components within normal limits  CBC WITH DIFFERENTIAL/PLATELET  BASIC METABOLIC PANEL  POC URINE PREG, ED  POCT PREGNANCY, URINE     ____________________________________________   EKG  None  ____________________________________________    RADIOLOGY  None   ____________________________________________   PROCEDURES  Procedure(s) performed: None  Critical Care performed: No  ____________________________________________   INITIAL IMPRESSION / ASSESSMENT AND PLAN / ED COURSE  Pertinent labs & imaging results that were available during my care of the patient were reviewed by me and considered in my medical decision making (see chart for details).  Patient  presented to the emergency department today because of concerns for but Implanon, vaginal bleeding and breast tenderness. I do believe that the vaginal bleeding and breast tenderness can be explained by her meds. Terms of the Implanon itself I do not see any skin changes over that area. It may be a little bit. I discussed with the patient that she should follow up with the OB/GYN doctors have placed. They can evaluate and decide whether or not they want or movement. We will give the patient some pain medication to help with cramping.  ____________________________________________   FINAL CLINICAL IMPRESSION(S) / ED DIAGNOSES  Final diagnoses:  Abnormal vaginal bleeding     Phineas Semen, MD 04/18/15 1843

## 2015-04-18 NOTE — Discharge Instructions (Signed)
Please seek medical attention for any high fevers, chest pain, shortness of breath, change in behavior, persistent vomiting, bloody stool or any other new or concerning symptoms. ° ° °Abnormal Uterine Bleeding °Abnormal uterine bleeding can affect women at various stages in life, including teenagers, women in their reproductive years, pregnant women, and women who have reached menopause. Several kinds of uterine bleeding are considered abnormal, including: °· Bleeding or spotting between periods.   °· Bleeding after sexual intercourse.   °· Bleeding that is heavier or more than normal.   °· Periods that last longer than usual. °· Bleeding after menopause.   °Many cases of abnormal uterine bleeding are minor and simple to treat, while others are more serious. Any type of abnormal bleeding should be evaluated by your health care provider. Treatment will depend on the cause of the bleeding. °HOME CARE INSTRUCTIONS °Monitor your condition for any changes. The following actions may help to alleviate any discomfort you are experiencing: °· Avoid the use of tampons and douches as directed by your health care provider. °· Change your pads frequently. °You should get regular pelvic exams and Pap tests. Keep all follow-up appointments for diagnostic tests as directed by your health care provider.  °SEEK MEDICAL CARE IF:  °· Your bleeding lasts more than 1 week.   °· You feel dizzy at times.   °SEEK IMMEDIATE MEDICAL CARE IF:  °· You pass out.   °· You are changing pads every 15 to 30 minutes.   °· You have abdominal pain. °· You have a fever.   °· You become sweaty or weak.   °· You are passing large blood clots from the vagina.   °· You start to feel nauseous and vomit. °MAKE SURE YOU:  °· Understand these instructions. °· Will watch your condition. °· Will get help right away if you are not doing well or get worse. °  °This information is not intended to replace advice given to you by your health care provider. Make sure  you discuss any questions you have with your health care provider. °  °Document Released: 04/22/2005 Document Revised: 04/27/2013 Document Reviewed: 11/19/2012 °Elsevier Interactive Patient Education ©2016 Elsevier Inc. ° °

## 2015-04-20 ENCOUNTER — Encounter: Payer: Self-pay | Admitting: Obstetrics & Gynecology

## 2015-04-20 ENCOUNTER — Ambulatory Visit (INDEPENDENT_AMBULATORY_CARE_PROVIDER_SITE_OTHER): Payer: Medicaid Other | Admitting: Obstetrics & Gynecology

## 2015-04-20 VITALS — BP 127/82 | HR 59 | Temp 97.9°F | Ht 67.0 in | Wt 166.1 lb

## 2015-04-20 DIAGNOSIS — Z3046 Encounter for surveillance of implantable subdermal contraceptive: Secondary | ICD-10-CM

## 2015-04-20 DIAGNOSIS — Z30011 Encounter for initial prescription of contraceptive pills: Secondary | ICD-10-CM | POA: Diagnosis not present

## 2015-04-20 MED ORDER — NORGESTIM-ETH ESTRAD TRIPHASIC 0.18/0.215/0.25 MG-25 MCG PO TABS
1.0000 | ORAL_TABLET | Freq: Every day | ORAL | Status: DC
Start: 2015-04-20 — End: 2015-08-14

## 2015-04-20 NOTE — Progress Notes (Signed)
Patient ID: Penny Becker, female   DOB: [birth year], 24 y.o.   MRN: [id number] No LMP recorded. Patient has had an implant. Patient complains of pain at site of her Nexplanon and she wants it removed, Will use OCP after this and consider another LARC.   Patient given informed consent for removal of her Implanon, time out was performed.  Signed copy in the chart.  Appropriate time out taken. Implanon site identified.  Area prepped in usual sterile fashon. One cc of 1% lidocaine was used to anesthetize the area at the distal end of the implant. A small stab incision was made right beside the implant on the distal portion.  The implanon rod was grasped using hemostats and removed without difficulty.  There was less than 3 cc blood loss. There were no complications.   A pressure bandage was applied to reduce any bruising.  The patient tolerated the procedure well and was given post procedure instructions.  Rx for Ortho tricyclen was given  Adam PhenixJames G Tajia Szeliga, MD 04/20/2015

## 2015-04-20 NOTE — Patient Instructions (Signed)

## 2015-05-11 ENCOUNTER — Ambulatory Visit: Payer: Medicaid Other | Admitting: Obstetrics & Gynecology

## 2015-06-23 ENCOUNTER — Emergency Department
Admission: EM | Admit: 2015-06-23 | Discharge: 2015-06-23 | Disposition: A | Discharge status: Home / Self Care | Payer: Medicaid Other | Attending: Emergency Medicine | Admitting: Emergency Medicine

## 2015-06-23 DIAGNOSIS — Y92410 Unspecified street and highway as the place of occurrence of the external cause: Secondary | ICD-10-CM | POA: Insufficient documentation

## 2015-06-23 DIAGNOSIS — S50812A Abrasion of left forearm, initial encounter: Secondary | ICD-10-CM | POA: Diagnosis not present

## 2015-06-23 DIAGNOSIS — S3992XA Unspecified injury of lower back, initial encounter: Secondary | ICD-10-CM | POA: Insufficient documentation

## 2015-06-23 DIAGNOSIS — S59912A Unspecified injury of left forearm, initial encounter: Secondary | ICD-10-CM | POA: Diagnosis present

## 2015-06-23 DIAGNOSIS — Y9389 Activity, other specified: Secondary | ICD-10-CM | POA: Insufficient documentation

## 2015-06-23 DIAGNOSIS — S0990XA Unspecified injury of head, initial encounter: Secondary | ICD-10-CM | POA: Insufficient documentation

## 2015-06-23 DIAGNOSIS — Y998 Other external cause status: Secondary | ICD-10-CM | POA: Insufficient documentation

## 2015-06-23 DIAGNOSIS — Z793 Long term (current) use of hormonal contraceptives: Secondary | ICD-10-CM | POA: Diagnosis not present

## 2015-06-23 DIAGNOSIS — F17200 Nicotine dependence, unspecified, uncomplicated: Secondary | ICD-10-CM | POA: Diagnosis not present

## 2015-06-23 MED ORDER — CYCLOBENZAPRINE HCL 5 MG PO TABS: 5.0000 mg | ORAL_TABLET | Freq: Three times a day (TID) | ORAL | Status: DC | PRN

## 2015-06-23 MED ORDER — BACITRACIN ZINC 500 UNIT/GM EX OINT
TOPICAL_OINTMENT | Freq: Every day | CUTANEOUS | Status: DC
  Administered 2015-06-23: 22:00:00 via TOPICAL
  Filled 2015-06-23: qty 0.9

## 2015-06-23 MED ORDER — HYDROCODONE-ACETAMINOPHEN 5-325 MG PO TABS: 1.0000 | ORAL_TABLET | Freq: Two times a day (BID) | ORAL | Status: DC | PRN
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
## 2015-08-14 ENCOUNTER — Encounter (HOSPITAL_COMMUNITY): Payer: Self-pay

## 2015-08-14 ENCOUNTER — Inpatient Hospital Stay (HOSPITAL_COMMUNITY)
Admission: AD | Admit: 2015-08-14 | Discharge: 2015-08-14 | Disposition: A | Discharge status: Home / Self Care | Payer: Medicaid Other | Source: Ambulatory Visit | Attending: Obstetrics & Gynecology | Admitting: Obstetrics & Gynecology

## 2015-08-14 DIAGNOSIS — N926 Irregular menstruation, unspecified: Secondary | ICD-10-CM | POA: Diagnosis not present

## 2015-08-14 DIAGNOSIS — N912 Amenorrhea, unspecified: Secondary | ICD-10-CM | POA: Insufficient documentation

## 2015-08-14 DIAGNOSIS — R110 Nausea: Secondary | ICD-10-CM | POA: Diagnosis present

## 2015-08-14 DIAGNOSIS — R109 Unspecified abdominal pain: Secondary | ICD-10-CM | POA: Diagnosis present

## 2015-08-14 LAB — WET PREP, GENITAL
Sperm: NONE SEEN
Trich, Wet Prep: NONE SEEN
Yeast Wet Prep HPF POC: NONE SEEN

## 2015-08-14 LAB — COMPREHENSIVE METABOLIC PANEL
ALT: 11 U/L — ABNORMAL LOW (ref 14–54)
AST: 13 U/L — ABNORMAL LOW (ref 15–41)
Albumin: 4.3 g/dL (ref 3.5–5.0)
Alkaline Phosphatase: 60 U/L (ref 38–126)
Anion gap: 5 (ref 5–15)
BUN: 11 mg/dL (ref 6–20)
CO2: 28 mmol/L (ref 22–32)
Calcium: 9 mg/dL (ref 8.9–10.3)
Chloride: 106 mmol/L (ref 101–111)
Creatinine, Ser: 0.7 mg/dL (ref 0.44–1.00)
GFR calc Af Amer: >60 mL/min (ref >60)
GFR calc non Af Amer: >60 mL/min (ref >60)
Glucose, Bld: 81 mg/dL (ref 65–99)
Potassium: 3.7 mmol/L (ref 3.5–5.1)
Sodium: 139 mmol/L (ref 135–145)
Total Bilirubin: 0.3 mg/dL (ref 0.3–1.2)
Total Protein: 7.3 g/dL (ref 6.5–8.1)

## 2015-08-14 LAB — CBC
HCT: 38.1 % (ref 36.0–46.0)
Hemoglobin: 12.8 g/dL (ref 12.0–15.0)
MCH: 30.5 pg (ref 26.0–34.0)
MCHC: 33.6 g/dL (ref 30.0–36.0)
MCV: 90.7 fL (ref 78.0–100.0)
Platelets: 246 10*3/uL (ref 150–400)
RBC: 4.2 MIL/uL (ref 3.87–5.11)
RDW: 12.3 % (ref 11.5–15.5)
WBC: 6.9 10*3/uL (ref 4.0–10.5)

## 2015-08-14 LAB — HCG, QUANTITATIVE, PREGNANCY: hCG, Beta Chain, Quant, S: <1 m[IU]/mL (ref <5)

## 2015-08-14 NOTE — Progress Notes (Signed)
S: 25 y.o. [id number] presents to MAU with faintly positive and negative pregnancy tests in last 2 weeks at home. She reports her LMP was unusual, lighter than usual, and she has nausea daily with intermittent abdominal cramping x 2 weeks with none today.  She reports her pain is mild and has not required treatment but she is worried that she is pregnant, or if not, that something else is wrong.   O: BP 130/67 mmHg  Pulse 70  Temp(Src) 98.4 F (36.9 C) (Oral)  Resp 18   VS reviewed, nursing note reviewed,  Constitutional: well developed, well nourished, no distress HEENT: normocephalic CV: normal rate Pulm/chest wall: normal effort Abdomen: soft Neuro: alert and oriented x 3 Skin: warm, dry Psych: affect normal   A/P: Plan for pt to be evaluated in MAU CBC, CMP, and hcg ordered  LEFTWICH-KIRBY, LISA, CNM 7:04 PM

## 2015-08-14 NOTE — MAU Provider Note (Signed)
History   nulparous pt in for pregnancy test. Stats took one at home and was faintly pos, also has had some nausea. States nexplanon was taken out in dec and periods have been irreg since that time.  CSN: [id number]  Arrival date & time 08/14/15  5621   First Provider Initiated Contact with Patient 08/14/15 2001      Chief Complaint  Patient presents with  . Possible Pregnancy    HPI  Past Medical History  Diagnosis Date  . Anemia     1st pregnancy, tx'd with iron  . Abnormal Pap smear   . Subchorionic hemorrhage 10/22/10  . Prior pregnancy complicated by PIH, antepartum     during 1st pregnancy  . History of recurrent UTIs last one 2 months ago  . Chlamydia     Past Surgical History  Procedure Laterality Date  . Induced abortion      2010, 2011  . Dilation and curettage of uterus      Family History  Problem Relation Age of Onset  . Schizophrenia Paternal Grandmother   . Diabetes Paternal Grandmother   . Mental illness Paternal Grandmother   . Hypertension Father   . Kidney disease Daughter     horse shoe kidneys recurrent UTI  . Anesthesia problems Neg Hx   . Hypertension Mother   . Heart disease Maternal Grandmother   . Liver disease Maternal Grandmother   . Heart disease Maternal Grandfather   . Liver disease Maternal Grandfather     Social History  Substance Use Topics  . Smoking status: Current Every Day Smoker -- 1.00 packs/day  . Smokeless tobacco: Never Used  . Alcohol Use: No    OB History    Gravida Para Term Preterm AB TAB SAB Ectopic Multiple Living   0 0 0 2      Review of Systems  Constitutional: Negative.   HENT: Negative.   Eyes: Negative.   Respiratory: Negative.   Cardiovascular: Negative.   Gastrointestinal: Positive for nausea.  Endocrine: Negative.   Genitourinary: Negative.   Musculoskeletal: Negative.   Skin: Negative.   Allergic/Immunologic: Negative.   Neurological: Negative.   Hematological: Negative.    Psychiatric/Behavioral: Negative.     Allergies  Review of patient's allergies indicates no known allergies.  Home Medications  No current outpatient prescriptions on file.  BP 130/67 mmHg  Pulse 70  Temp(Src) 98.4 F (36.9 C) (Oral)  Resp 18  Physical Exam  Constitutional: She is oriented to person, place, and time. She appears well-developed and well-nourished.  HENT:  Head: Normocephalic.  Eyes: Pupils are equal, round, and reactive to light.  Neck: Normal range of motion.  Cardiovascular: Normal rate, regular rhythm, normal heart sounds and intact distal pulses.   Pulmonary/Chest: Effort normal and breath sounds normal.  Abdominal: Soft. Bowel sounds are normal.  Genitourinary: Vagina normal and uterus normal.  Musculoskeletal: Normal range of motion.  Neurological: She is alert and oriented to person, place, and time. She has normal reflexes.  Skin: Skin is warm and dry.  Psychiatric: She has a normal mood and affect. Her behavior is normal. Judgment and thought content normal.    MAU Course  Procedures (including critical care time)  Labs Reviewed  COMPREHENSIVE METABOLIC PANEL - Abnormal; Notable for the following:    AST 13 (*)    ALT 11 (*)    All other components within normal limits  CBC  HCG, QUANTITATIVE, PREGNANCY   No  results found.   No diagnosis found.    MDM  irreg memses secondary to contraception removal WET prep, chla and GC obtained.  D/c home

## 2015-08-14 NOTE — MAU Note (Addendum)
Had some irreg bleeding like period was going to start, on a day off a day, then back on again.  Did a HPT yesterday and last wk, both showed faintly positive(tests with lines), also did a digital test and it was neg. Wants to know if she is preg, if she can get a blood test

## 2015-08-14 NOTE — MAU Provider Note (Deleted)
[name] is a 25 y.o. [id number] at Unknown who presents to MAU today for pregnancy verification. The patient denies abdominal pain or vaginal bleeding today.   BP 130/67 mmHg  Pulse 70  Temp(Src) 98.4 F (36.9 C) (Oral)  Resp 18  CONSTITUTIONAL: Well-developed, well-nourished female in no acute distress.  CARDIOVASCULAR: Regular heart rate RESPIRATORY: Normal effort NEUROLOGICAL: Alert and oriented to person, place, and time.  SKIN: Skin is warm and dry. No rash noted. Not diaphoretic. No erythema. No pallor. PSYCH: Normal mood and affect. Normal behavior. Normal judgment and thought content.  MDM Medical Screen Exam Complete  A: Amenorrhea   P: Discharge from MAU Patient advised to follow-up with [facility] for pregnancy confirmation  Monday-Thursday 8am-4pm or Friday 8am-11am Patient may return to MAU as needed or if her condition were to change or worsen   Hurshel PartyLisa A Leftwich-Kirby, CNM  08/14/2015 6:51 PM

## 2015-08-14 NOTE — Discharge Instructions (Signed)

## 2015-08-15 LAB — GC/CHLAMYDIA PROBE AMP (~~LOC~~) NOT AT ARMC
Chlamydia: POSITIVE — AB
Neisseria Gonorrhea: NEGATIVE

## 2015-08-17 ENCOUNTER — Telehealth (HOSPITAL_COMMUNITY): Payer: Self-pay | Admitting: *Deleted

## 2015-08-17 DIAGNOSIS — A749 Chlamydial infection, unspecified: Secondary | ICD-10-CM

## 2015-08-17 MED ORDER — AZITHROMYCIN 500 MG PO TABS: ORAL_TABLET | ORAL | Status: DC

## 2015-08-17 NOTE — Telephone Encounter (Signed)
Telephone call to patient regarding positive chlamydia culture, patient notified.  Patient had not been treated.  Rx routed to patient's pharmacy.  Instructed patient to notify her partner for treatment and to abstain from sex for seven days post treatment.  Report faxed to health department.

## 2016-01-24 ENCOUNTER — Inpatient Hospital Stay (HOSPITAL_COMMUNITY)
Admission: AD | Admit: 2016-01-24 | Discharge: 2016-01-24 | Disposition: A | Discharge status: Home / Self Care | Payer: Medicaid Other | Source: Ambulatory Visit | Attending: Obstetrics and Gynecology | Admitting: Obstetrics and Gynecology

## 2016-01-24 ENCOUNTER — Inpatient Hospital Stay (HOSPITAL_COMMUNITY): Payer: Medicaid Other

## 2016-01-24 ENCOUNTER — Encounter (HOSPITAL_COMMUNITY): Payer: Self-pay | Admitting: *Deleted

## 2016-01-24 DIAGNOSIS — O09299 Supervision of pregnancy with other poor reproductive or obstetric history, unspecified trimester: Secondary | ICD-10-CM

## 2016-01-24 DIAGNOSIS — Z3201 Encounter for pregnancy test, result positive: Secondary | ICD-10-CM | POA: Insufficient documentation

## 2016-01-24 DIAGNOSIS — Z8619 Personal history of other infectious and parasitic diseases: Secondary | ICD-10-CM | POA: Insufficient documentation

## 2016-01-24 DIAGNOSIS — K5900 Constipation, unspecified: Secondary | ICD-10-CM

## 2016-01-24 DIAGNOSIS — R102 Pelvic and perineal pain: Secondary | ICD-10-CM | POA: Diagnosis not present

## 2016-01-24 DIAGNOSIS — Z3A01 Less than 8 weeks gestation of pregnancy: Secondary | ICD-10-CM | POA: Diagnosis not present

## 2016-01-24 DIAGNOSIS — Z841 Family history of disorders of kidney and ureter: Secondary | ICD-10-CM | POA: Insufficient documentation

## 2016-01-24 DIAGNOSIS — Z833 Family history of diabetes mellitus: Secondary | ICD-10-CM | POA: Insufficient documentation

## 2016-01-24 DIAGNOSIS — R109 Unspecified abdominal pain: Secondary | ICD-10-CM | POA: Insufficient documentation

## 2016-01-24 DIAGNOSIS — Z3480 Encounter for supervision of other normal pregnancy, unspecified trimester: Secondary | ICD-10-CM

## 2016-01-24 DIAGNOSIS — Z87440 Personal history of urinary (tract) infections: Secondary | ICD-10-CM | POA: Insufficient documentation

## 2016-01-24 DIAGNOSIS — Z818 Family history of other mental and behavioral disorders: Secondary | ICD-10-CM | POA: Insufficient documentation

## 2016-01-24 DIAGNOSIS — Z8249 Family history of ischemic heart disease and other diseases of the circulatory system: Secondary | ICD-10-CM | POA: Diagnosis not present

## 2016-01-24 DIAGNOSIS — Z87891 Personal history of nicotine dependence: Secondary | ICD-10-CM | POA: Diagnosis not present

## 2016-01-24 DIAGNOSIS — O26891 Other specified pregnancy related conditions, first trimester: Secondary | ICD-10-CM | POA: Diagnosis not present

## 2016-01-24 LAB — URINALYSIS, ROUTINE W REFLEX MICROSCOPIC
Bilirubin Urine: NEGATIVE
Glucose, UA: NEGATIVE mg/dL
Hgb urine dipstick: NEGATIVE
Ketones, ur: NEGATIVE mg/dL
Leukocytes, UA: NEGATIVE
Nitrite: NEGATIVE
Protein, ur: NEGATIVE mg/dL
Specific Gravity, Urine: >1.03 — ABNORMAL HIGH (ref 1.005–1.030)
pH: 5.5 (ref 5.0–8.0)

## 2016-01-24 LAB — POCT PREGNANCY, URINE: Preg Test, Ur: POSITIVE — AB

## 2016-01-24 LAB — WET PREP, GENITAL
Sperm: NONE SEEN
Trich, Wet Prep: NONE SEEN
Yeast Wet Prep HPF POC: NONE SEEN

## 2016-01-24 MED ORDER — DOCUSATE SODIUM 100 MG PO CAPS: 100.0000 mg | ORAL_CAPSULE | Freq: Two times a day (BID) | ORAL | 2 refills | Status: DC

## 2016-01-24 MED ORDER — BISACODYL 10 MG RE SUPP: 10.0000 mg | RECTAL | 0 refills | Status: DC | PRN

## 2016-01-24 MED ORDER — PRENATAL VITAMINS 0.8 MG PO TABS: 1.0000 | ORAL_TABLET | Freq: Every day | ORAL | 12 refills | Status: DC

## 2016-01-24 NOTE — MAU Provider Note (Signed)
History     CSN: [id number]  Arrival date and time: 01/24/16 1216   First Provider Initiated Contact with Patient 01/24/16 1252      Chief Complaint  Patient presents with  . Abdominal Pain  . Constipation   HPI 25 yo presenting today for the evaluation of abdominal pain. Patient states the pain has been present intermittently for the past 2 weeks. It is located mainly on the left side but at times travels across her lower abdomen to the right side. She denies any vaginal bleeding or abnormal discharge. She reports a history of constipation with her last bowel movement being a month ago. She has tried increasing her water intake, consuming prune/pear juice. She was uncertain which over the counter remedy she could try. She has not yet started prenatal care for this pregnancy  OB History    Gravida Para Term Preterm AB Living   6 2 2  0 3 2   SAB TAB Ectopic Multiple Live Births   1 2 0 0 2      Past Medical History:  Diagnosis Date  . Abnormal Pap smear   . Anemia    1st pregnancy, tx'd with iron  . Chlamydia   . History of recurrent UTIs last one 2 months ago  . Prior pregnancy complicated by PIH, antepartum    during 1st pregnancy  . Subchorionic hemorrhage 10/22/10    Past Surgical History:  Procedure Laterality Date  . DILATION AND CURETTAGE OF UTERUS    . INDUCED ABORTION     2010, 2011    Family History  Problem Relation Age of Onset  . Heart disease Maternal Grandmother   . Liver disease Maternal Grandmother   . Heart disease Maternal Grandfather   . Liver disease Maternal Grandfather   . Schizophrenia Paternal Grandmother   . Diabetes Paternal Grandmother   . Mental illness Paternal Grandmother   . Hypertension Father   . Kidney disease Daughter     horse shoe kidneys recurrent UTI  . Hypertension Mother   . Anesthesia problems Neg Hx     Social History  Substance Use Topics  . Smoking status: Former Smoker    Packs/day: 1.00    Quit date:  01/20/2016  . Smokeless tobacco: Never Used  . Alcohol use No    Allergies: No Known Allergies  Prescriptions Prior to Admission  Medication Sig Dispense Refill Last Dose  . azithromycin (ZITHROMAX) 500 MG tablet Take two tablets by mouth once. (Patient not taking: Reported on 01/24/2016) 2 tablet 0 Not Taking at Unknown time    ROS  See pertinent in HPI Physical Exam   Blood pressure 124/60, pulse 82, temperature 98.1 F (36.7 C), temperature source Oral, resp. rate 18, height 5' 7.5" (1.715 m), weight 165 lb 8 oz (75.1 kg), last menstrual period 12/11/2015.  Physical Exam GENERAL: Well-developed, well-nourished female in no acute distress.  HEENT: Normocephalic, atraumatic.  LUNGS: Clear to auscultation bilaterally.  HEART: Regular rate and rhythm. ABDOMEN: Soft, nontender, nondistended. No organomegaly. PELVIC: Normal external female genitalia. Vagina is pink and rugated.  Thin white discharge. Normal appearing cervix. Uterus is normal in size. No adnexal mass or tenderness. EXTREMITIES: No cyanosis, clubbing, or edema, 2+ distal pulses.  MAU Course  Procedures  MDM UA negative Wet prep- Pelvic ultrasound FINDINGS: Intrauterine gestational sac: Single  Yolk sac:  Visualized  Embryo:  None visualized  MSD: 10  mm   5 w   5  d  Subchorionic hemorrhage:  None visualized.  Maternal uterus/adnexae: Small right ovarian corpus luteum noted. Normal appearance of left ovary. No mass or free fluid identified  IMPRESSION: Single intrauterine gestational sac measuring 5 weeks 5 days by mean sac diameter. Recommend followup of quantitative beta HCG levels, and consider followup ultrasound to assess viability in 10-14 days.  No significant maternal uterine or adnexal abnormality identified.   Electronically Signed   By: Myles Rosenthal M.D.   On: 01/24/2016 14:21  Assessment and Plan  25 yo [id number] with early pregnancy, pain likely related to h/o constipation -  Rx ducolax and colace provided - Advised patient to consume 25-30 gm fiber and 8-10 glasses of water - Patient informed of ultrasound results demonstrating an early pregnancy without visualization of fetal pole. Patient plans to initiate care in our [facility] office. Follow up ultrasound will be performed at that time - RTC if condition worsened   Darrly Loberg 01/24/2016, 2:42 PM

## 2016-01-24 NOTE — Discharge Instructions (Signed)
Make sure that you increase your water and fiber intake. At least 8-10 glasses of water per day and 25-30g of fiber per day. The fiber can come from a normal diet or supplements  Constipation, Adult Constipation is when a person has fewer than three bowel movements a week, has difficulty having a bowel movement, or has stools that are dry, hard, or larger than normal. As people grow older, constipation is more common. A low-fiber diet, not taking in enough fluids, and taking certain medicines may make constipation worse.  CAUSES   Certain medicines, such as antidepressants, pain medicine, iron supplements, antacids, and water pills.   Certain diseases, such as diabetes, irritable bowel syndrome (IBS), thyroid disease, or depression.   Not drinking enough water.   Not eating enough fiber-rich foods.   Stress or travel.   Lack of physical activity or exercise.   Ignoring the urge to have a bowel movement.   Using laxatives too much.  SIGNS AND SYMPTOMS   Having fewer than three bowel movements a week.   Straining to have a bowel movement.   Having stools that are hard, dry, or larger than normal.   Feeling full or bloated.   Pain in the lower abdomen.   Not feeling relief after having a bowel movement.  DIAGNOSIS  Your health care provider will take a medical history and perform a physical exam. Further testing may be done for severe constipation. Some tests may include:  A barium enema X-ray to examine your rectum, colon, and, sometimes, your small intestine.   A sigmoidoscopy to examine your lower colon.   A colonoscopy to examine your entire colon. TREATMENT  Treatment will depend on the severity of your constipation and what is causing it. Some dietary treatments include drinking more fluids and eating more fiber-rich foods. Lifestyle treatments may include regular exercise. If these diet and lifestyle recommendations do not help, your health care provider  may recommend taking over-the-counter laxative medicines to help you have bowel movements. Prescription medicines may be prescribed if over-the-counter medicines do not work.  HOME CARE INSTRUCTIONS   Eat foods that have a lot of fiber, such as fruits, vegetables, whole grains, and beans.  Limit foods high in fat and processed sugars, such as french fries, hamburgers, cookies, candies, and soda.   A fiber supplement may be added to your diet if you cannot get enough fiber from foods.   Drink enough fluids to keep your urine clear or pale yellow.   Exercise regularly or as directed by your health care provider.   Go to the restroom when you have the urge to go. Do not hold it.   Only take over-the-counter or prescription medicines as directed by your health care provider. Do not take other medicines for constipation without talking to your health care provider first.  SEEK IMMEDIATE MEDICAL CARE IF:   You have bright red blood in your stool.   Your constipation lasts for more than 4 days or gets worse.   You have abdominal or rectal pain.   You have thin, pencil-like stools.   You have unexplained weight loss. MAKE SURE YOU:   Understand these instructions.  Will watch your condition.  Will get help right away if you are not doing well or get worse.   This information is not intended to replace advice given to you by your health care provider. Make sure you discuss any questions you have with your health care provider.   Document Released:  01/19/2004 Document Revised: 05/13/2014 Document Reviewed: 02/01/2013 Elsevier Interactive Patient Education [employer].

## 2016-01-24 NOTE — MAU Note (Signed)
Before she found out she was preg, she was having problems with constipation.  Is having sharp pains and feels bloated. +HPT about a wk ago.

## 2016-01-25 ENCOUNTER — Other Ambulatory Visit: Payer: Self-pay | Admitting: Obstetrics and Gynecology

## 2016-01-25 ENCOUNTER — Telehealth: Payer: Self-pay | Admitting: *Deleted

## 2016-01-25 LAB — GC/CHLAMYDIA PROBE AMP (~~LOC~~) NOT AT ARMC
Chlamydia: NEGATIVE
Neisseria Gonorrhea: NEGATIVE

## 2016-01-25 MED ORDER — METRONIDAZOLE 500 MG PO TABS: 500.0000 mg | ORAL_TABLET | Freq: Two times a day (BID) | ORAL | 0 refills | Status: AC

## 2016-01-25 NOTE — Telephone Encounter (Signed)
Called pt to inform her of wet prep result and that Flagyl had been sent to pharmacy, no answer, left message to call the office.

## 2016-02-03 ENCOUNTER — Inpatient Hospital Stay (HOSPITAL_COMMUNITY)
Admission: AD | Admit: 2016-02-03 | Discharge: 2016-02-03 | Disposition: A | Discharge status: Home / Self Care | Payer: Medicaid Other | Source: Ambulatory Visit | Attending: Family Medicine | Admitting: Family Medicine

## 2016-02-03 ENCOUNTER — Encounter (HOSPITAL_COMMUNITY): Payer: Self-pay

## 2016-02-03 DIAGNOSIS — Z87891 Personal history of nicotine dependence: Secondary | ICD-10-CM | POA: Insufficient documentation

## 2016-02-03 DIAGNOSIS — K5900 Constipation, unspecified: Secondary | ICD-10-CM | POA: Diagnosis present

## 2016-02-03 DIAGNOSIS — K5901 Slow transit constipation: Secondary | ICD-10-CM

## 2016-02-03 DIAGNOSIS — O26891 Other specified pregnancy related conditions, first trimester: Secondary | ICD-10-CM | POA: Diagnosis not present

## 2016-02-03 DIAGNOSIS — Z3A01 Less than 8 weeks gestation of pregnancy: Secondary | ICD-10-CM | POA: Insufficient documentation

## 2016-02-03 LAB — URINALYSIS, ROUTINE W REFLEX MICROSCOPIC
Bilirubin Urine: NEGATIVE
Glucose, UA: NEGATIVE mg/dL
Hgb urine dipstick: NEGATIVE
Ketones, ur: NEGATIVE mg/dL
Leukocytes, UA: NEGATIVE
Nitrite: NEGATIVE
Protein, ur: NEGATIVE mg/dL
Specific Gravity, Urine: 1.025 (ref 1.005–1.030)
pH: 6 (ref 5.0–8.0)

## 2016-02-03 NOTE — MAU Note (Addendum)
Pt was here wk or so ago with constipation. Given med but not helping. Having nausea and abd pain. Last BM was 2 days ago and was small, hard stool. Taking fiber pills. Has taken 2 laxative pills or 3 occasions and just gives me gas

## 2016-02-03 NOTE — Discharge Instructions (Signed)
Bowel Regimen Take Dulcolax Suppository this PM followed 30 minutes by 1 bottle of magnesium citrate. Tomorrow take 1 capful of miralx daily. Can increase to 2 capfuls daily. Take daily for 1 week then space to as needed. You can buy a fleet enema and use once if none of this works.  You can take 25mg  of unisom and 25 mg of vitamin B6 for nausea. You can take each of these up to 3 times a day. You can take OTC folic acid to avoid the iron in prenatals that can be constipated.  Constipation, Adult Constipation is when a person has fewer than three bowel movements a week, has difficulty having a bowel movement, or has stools that are dry, hard, or larger than normal. As people grow older, constipation is more common. A low-fiber diet, not taking in enough fluids, and taking certain medicines may make constipation worse.  CAUSES   Certain medicines, such as antidepressants, pain medicine, iron supplements, antacids, and water pills.   Certain diseases, such as diabetes, irritable bowel syndrome (IBS), thyroid disease, or depression.   Not drinking enough water.   Not eating enough fiber-rich foods.   Stress or travel.   Lack of physical activity or exercise.   Ignoring the urge to have a bowel movement.   Using laxatives too much.  SIGNS AND SYMPTOMS   Having fewer than three bowel movements a week.   Straining to have a bowel movement.   Having stools that are hard, dry, or larger than normal.   Feeling full or bloated.   Pain in the lower abdomen.   Not feeling relief after having a bowel movement.  DIAGNOSIS  Your health care provider will take a medical history and perform a physical exam. Further testing may be done for severe constipation. Some tests may include:  A barium enema X-ray to examine your rectum, colon, and, sometimes, your small intestine.   A sigmoidoscopy to examine your lower colon.   A colonoscopy to examine your entire  colon. TREATMENT  Treatment will depend on the severity of your constipation and what is causing it. Some dietary treatments include drinking more fluids and eating more fiber-rich foods. Lifestyle treatments may include regular exercise. If these diet and lifestyle recommendations do not help, your health care provider may recommend taking over-the-counter laxative medicines to help you have bowel movements. Prescription medicines may be prescribed if over-the-counter medicines do not work.  HOME CARE INSTRUCTIONS   Eat foods that have a lot of fiber, such as fruits, vegetables, whole grains, and beans.  Limit foods high in fat and processed sugars, such as french fries, hamburgers, cookies, candies, and soda.   A fiber supplement may be added to your diet if you cannot get enough fiber from foods.   Drink enough fluids to keep your urine clear or pale yellow.   Exercise regularly or as directed by your health care provider.   Go to the restroom when you have the urge to go. Do not hold it.   Only take over-the-counter or prescription medicines as directed by your health care provider. Do not take other medicines for constipation without talking to your health care provider first.  SEEK IMMEDIATE MEDICAL CARE IF:   You have bright red blood in your stool.   Your constipation lasts for more than 4 days or gets worse.   You have abdominal or rectal pain.   You have thin, pencil-like stools.   You have unexplained weight loss. MAKE  SURE YOU:   Understand these instructions.  Will watch your condition.  Will get help right away if you are not doing well or get worse.   This information is not intended to replace advice given to you by your health care provider. Make sure you discuss any questions you have with your health care provider.   Document Released: 01/19/2004 Document Revised: 05/13/2014 Document Reviewed: 02/01/2013 Elsevier Interactive Patient Education [email or web address]  Elsevier Inc.

## 2016-02-03 NOTE — MAU Provider Note (Signed)
History     CSN: [id number]  Arrival date and time: 02/03/16 2023   First Provider Initiated Contact with Patient 02/03/16 2119      Chief Complaint  Patient presents with  . Constipation   HPI   Pt is a 25 y/o G6P2 @ 7wks and 5 days with complaints of constipation. She reports that she has not had a good bowel movement for approximately 1 month. She reports she has stopped taking her prenatal vitamins because she was worried it was making the constipation worse. She has complaints of feeling like she wants to have a BM, but is unable to. She was on stool softeners for about 1 wk but does not seem to be make significant improvement. She does report occasionally passing small balls of hard stool, but no normal BM.   OB History    Gravida Para Term Preterm AB Living   6 2 2  0 3 2   SAB TAB Ectopic Multiple Live Births   1 2 0 0 2      Past Medical History:  Diagnosis Date  . Abnormal Pap smear   . Anemia    1st pregnancy, tx'd with iron  . Chlamydia   . History of recurrent UTIs last one 2 months ago  . Prior pregnancy complicated by PIH, antepartum    during 1st pregnancy  . Subchorionic hemorrhage 10/22/10    Past Surgical History:  Procedure Laterality Date  . DILATION AND CURETTAGE OF UTERUS    . INDUCED ABORTION     2010, 2011    Family History  Problem Relation Age of Onset  . Heart disease Maternal Grandmother   . Liver disease Maternal Grandmother   . Heart disease Maternal Grandfather   . Liver disease Maternal Grandfather   . Schizophrenia Paternal Grandmother   . Diabetes Paternal Grandmother   . Mental illness Paternal Grandmother   . Hypertension Father   . Kidney disease Daughter     horse shoe kidneys recurrent UTI  . Hypertension Mother   . Anesthesia problems Neg Hx     Social History  Substance Use Topics  . Smoking status: Former Smoker    Packs/day: 1.00    Quit date: 01/20/2016  . Smokeless tobacco: Never Used  . Alcohol use No     Allergies: No Known Allergies  Prescriptions Prior to Admission  Medication Sig Dispense Refill Last Dose  . bisacodyl (DULCOLAX) 10 MG suppository Place 1 suppository (10 mg total) rectally as needed for moderate constipation. 12 suppository 0   . docusate sodium (COLACE) 100 MG capsule Take 1 capsule (100 mg total) by mouth 2 (two) times daily. 30 capsule 2   . Prenatal Multivit-Min-Fe-FA (PRENATAL VITAMINS) 0.8 MG tablet Take 1 tablet by mouth daily. 30 tablet 12     Review of Systems  Constitutional: Negative for chills and fever.  HENT: Negative for nosebleeds.   Eyes: Negative for blurred vision and double vision.  Respiratory: Negative for cough.   Cardiovascular: Negative for chest pain and palpitations.  Gastrointestinal: Positive for abdominal pain, constipation and nausea. Negative for diarrhea, heartburn and vomiting.  Genitourinary: Negative for dysuria, frequency and urgency.  Skin: Negative for itching and rash.  Neurological: Negative for dizziness, tingling and headaches.  Endo/Heme/Allergies: Negative for environmental allergies. Does not bruise/bleed easily.   Physical Exam   Blood pressure 127/61, pulse (!) 59, temperature 98.5 F (36.9 C), resp. rate 18, height 5' 7.5" (1.715 m), weight 168 lb 12.8 oz (76.6  kg), last menstrual period 12/11/2015.  Physical Exam  Constitutional: She is oriented to person, place, and time. She appears well-developed and well-nourished.  Respiratory: Effort normal and breath sounds normal. No respiratory distress.  GI: Soft. Bowel sounds are normal.  Moderate distention, palpable stool especially in left lower quadrant. Minimal tenderness.   Musculoskeletal: Normal range of motion. She exhibits no edema.  Neurological: She is alert and oriented to person, place, and time.  Skin: Skin is warm and dry.  Psychiatric: She has a normal mood and affect. Her behavior is normal.    MAU Course  Procedures  MDM In mau pt  underwent physical exam and was instructed on the various types of OTC laxative.  Assessment and Plan  1. Constipation: Pt with severe discomfort due to constipation. Recommend dulcolax suppository tonight followed by magnesium citrate bottle. Tomorrow she should start once daily miralax 1 capful for a week. Advised if she gets no relief with this regimen she can increase to 2 capfuls or attempt a fleets enema x1.   2. Pregnancy: advised she could take folic acid only instead of pre-natal vitamin and Unisom/B6 for nausea.   Penny Becker 02/03/2016, 9:35 PM

## 2016-02-16 ENCOUNTER — Inpatient Hospital Stay (HOSPITAL_COMMUNITY)
Admission: AD | Admit: 2016-02-16 | Discharge: 2016-02-16 | Disposition: A | Discharge status: Home / Self Care | Payer: Medicaid Other | Source: Ambulatory Visit | Attending: Obstetrics & Gynecology | Admitting: Obstetrics & Gynecology

## 2016-02-16 ENCOUNTER — Inpatient Hospital Stay (HOSPITAL_COMMUNITY): Payer: Medicaid Other

## 2016-02-16 DIAGNOSIS — R102 Pelvic and perineal pain: Secondary | ICD-10-CM | POA: Diagnosis present

## 2016-02-16 DIAGNOSIS — Z3A08 8 weeks gestation of pregnancy: Secondary | ICD-10-CM

## 2016-02-16 DIAGNOSIS — Z87891 Personal history of nicotine dependence: Secondary | ICD-10-CM | POA: Insufficient documentation

## 2016-02-16 DIAGNOSIS — O021 Missed abortion: Secondary | ICD-10-CM | POA: Diagnosis not present

## 2016-02-16 LAB — CBC
HCT: 33.5 % — ABNORMAL LOW (ref 36.0–46.0)
Hemoglobin: 11.6 g/dL — ABNORMAL LOW (ref 12.0–15.0)
MCH: 30.9 pg (ref 26.0–34.0)
MCHC: 34.6 g/dL (ref 30.0–36.0)
MCV: 89.3 fL (ref 78.0–100.0)
Platelets: 227 10*3/uL (ref 150–400)
RBC: 3.75 MIL/uL — ABNORMAL LOW (ref 3.87–5.11)
RDW: 12.1 % (ref 11.5–15.5)
WBC: 10.4 10*3/uL (ref 4.0–10.5)

## 2016-02-16 MED ORDER — MISOPROSTOL 200 MCG PO TABS: 800.0000 ug | ORAL_TABLET | Freq: Once | ORAL | 0 refills | Status: DC

## 2016-02-16 MED ORDER — HYDROCODONE-ACETAMINOPHEN 5-325 MG PO TABS: 1.0000 | ORAL_TABLET | ORAL | 0 refills | Status: DC | PRN

## 2016-02-16 MED ORDER — PROMETHAZINE HCL 25 MG PO TABS: 12.5000 mg | ORAL_TABLET | Freq: Four times a day (QID) | ORAL | 0 refills | Status: DC | PRN

## 2016-02-16 MED ORDER — IBUPROFEN 800 MG PO TABS: 800.0000 mg | ORAL_TABLET | Freq: Three times a day (TID) | ORAL | 0 refills | Status: DC | PRN

## 2016-02-16 NOTE — Discharge Instructions (Signed)
Miscarriage  A miscarriage is the sudden loss of an unborn baby (fetus) before the 20th week of pregnancy. Most miscarriages happen in the first 3 months of pregnancy. Sometimes, it happens before a woman even knows she is pregnant. A miscarriage is also called a "spontaneous miscarriage" or "early pregnancy loss." Having a miscarriage can be an emotional experience. Talk with your caregiver about any questions you may have about miscarrying, the grieving process, and your future pregnancy plans.  CAUSES    Problems with the fetal chromosomes that make it impossible for the baby to develop normally. Problems with the baby's genes or chromosomes are most often the result of errors that occur, by chance, as the embryo divides and grows. The problems are not inherited from the parents.   Infection of the cervix or uterus.    Hormone problems.    Problems with the cervix, such as having an incompetent cervix. This is when the tissue in the cervix is not strong enough to hold the pregnancy.    Problems with the uterus, such as an abnormally shaped uterus, uterine fibroids, or congenital abnormalities.    Certain medical conditions.    Smoking, drinking alcohol, or taking illegal drugs.    Trauma.   Often, the cause of a miscarriage is unknown.   SYMPTOMS    Vaginal bleeding or spotting, with or without cramps or pain.   Pain or cramping in the abdomen or lower back.   Passing fluid, tissue, or blood clots from the vagina.  DIAGNOSIS   Your caregiver will perform a physical exam. You may also have an ultrasound to confirm the miscarriage. Blood or urine tests may also be ordered.  TREATMENT    Sometimes, treatment is not necessary if you naturally pass all the fetal tissue that was in the uterus. If some of the fetus or placenta remains in the body (incomplete miscarriage), tissue left behind may become infected and must be removed. Usually, a dilation and curettage (D and C) procedure is performed.  During a D and C procedure, the cervix is widened (dilated) and any remaining fetal or placental tissue is gently removed from the uterus.   Antibiotic medicines are prescribed if there is an infection. Other medicines may be given to reduce the size of the uterus (contract) if there is a lot of bleeding.   If you have Rh negative blood and your baby was Rh positive, you will need a Rh immunoglobulin shot. This shot will protect any future baby from having Rh blood problems in future pregnancies.  HOME CARE INSTRUCTIONS    Your caregiver may order bed rest or may allow you to continue light activity. Resume activity as directed by your caregiver.   Have someone help with home and family responsibilities during this time.    Keep track of the number of sanitary pads you use each day and how soaked (saturated) they are. Write down this information.    Do not use tampons. Do not douche or have sexual intercourse until approved by your caregiver.    Only take over-the-counter or prescription medicines for pain or discomfort as directed by your caregiver.    Do not take aspirin. Aspirin can cause bleeding.    Keep all follow-up appointments with your caregiver.    If you or your partner have problems with grieving, talk to your caregiver or seek counseling to help cope with the pregnancy loss. Allow enough time to grieve before trying to get pregnant again.     SEEK IMMEDIATE MEDICAL CARE IF:    You have severe cramps or pain in your back or abdomen.   You have a fever.   You pass large blood clots (walnut-sized or larger) ortissue from your vagina. Save any tissue for your caregiver to inspect.    Your bleeding increases.    You have a thick, bad-smelling vaginal discharge.   You become lightheaded, weak, or you faint.    You have chills.   MAKE SURE YOU:   Understand these instructions.   Will watch your condition.   Will get help right away if you are not doing well or get worse.     This  information is not intended to replace advice given to you by your health care provider. Make sure you discuss any questions you have with your health care provider.     Document Released: 10/16/2000 Document Revised: 08/17/2012 Document Reviewed: 06/11/2011  Elsevier Interactive Patient Education 2016 Elsevier Inc.

## 2016-02-16 NOTE — MAU Note (Signed)
[place] [facility] [place] Had [place] in office, showed no FH, measuring 8wks.  They were going to do the procedure there, but medicaid card has different dr listed.  Called the office listed and they didn't answer the phone.  Having pain, still constipated. No bleeding.

## 2016-02-16 NOTE — MAU Provider Note (Signed)
History     CSN: [id number]  Arrival date and time: 02/16/16 1526   First Provider Initiated Contact with Patient 02/16/16 1653      Chief Complaint  Patient presents with  . Pelvic Pain   Pelvic Pain  The patient's primary symptoms include pelvic pain. This is a new problem. The current episode started in the past 7 days. The problem occurs intermittently. The problem has been unchanged. The pain is moderate. The problem affects both sides. She is pregnant. There has been no bleeding. She has not been passing clots. She has not been passing tissue. Nothing aggravates the symptoms. She has tried nothing for the symptoms. Menstrual history: LMP 12/11/15.   Past Medical History:  Diagnosis Date  . Abnormal Pap smear   . Anemia    1st pregnancy, tx'd with iron  . Chlamydia   . History of recurrent UTIs last one 2 months ago  . Prior pregnancy complicated by PIH, antepartum    during 1st pregnancy  . Subchorionic hemorrhage 10/22/10    Past Surgical History:  Procedure Laterality Date  . DILATION AND CURETTAGE OF UTERUS    . INDUCED ABORTION     2010, 2011    Family History  Problem Relation Age of Onset  . Heart disease Maternal Grandmother   . Liver disease Maternal Grandmother   . Heart disease Maternal Grandfather   . Liver disease Maternal Grandfather   . Schizophrenia Paternal Grandmother   . Diabetes Paternal Grandmother   . Mental illness Paternal Grandmother   . Hypertension Father   . Kidney disease Daughter     horse shoe kidneys recurrent UTI  . Hypertension Mother   . Anesthesia problems Neg Hx     Social History  Substance Use Topics  . Smoking status: Former Smoker    Packs/day: 1.00    Quit date: 01/20/2016  . Smokeless tobacco: Never Used  . Alcohol use No    Allergies: No Known Allergies  Prescriptions Prior to Admission  Medication Sig Dispense Refill Last Dose  . bisacodyl (DULCOLAX) 10 MG suppository Place 1 suppository (10 mg total)  rectally as needed for moderate constipation. 12 suppository 0   . docusate sodium (COLACE) 100 MG capsule Take 1 capsule (100 mg total) by mouth 2 (two) times daily. 30 capsule 2   . Prenatal Multivit-Min-Fe-FA (PRENATAL VITAMINS) 0.8 MG tablet Take 1 tablet by mouth daily. 30 tablet 12     Review of Systems  Genitourinary: Positive for pelvic pain.   Physical Exam   Blood pressure 148/57, pulse 63, temperature 98.2 F (36.8 C), temperature source Oral, resp. rate 18, weight 169 lb 12 oz (77 kg), last menstrual period 12/11/2015.  Physical Exam  Nursing note and vitals reviewed. Constitutional: She is oriented to person, place, and time. She appears well-developed and well-nourished. No distress.  HENT:  Head: Normocephalic.  Cardiovascular: Normal rate.   Respiratory: Effort normal.  GI: Soft. There is no tenderness. There is no rebound.  Neurological: She is alert and oriented to person, place, and time.  Skin: Skin is warm and dry.  Psychiatric: She has a normal mood and affect.   Results for orders placed or performed during the hospital encounter of 02/16/16 (from the past 24 hour(s))  CBC     Status: Abnormal   Collection Time: 02/16/16  5:03 PM  Result Value Ref Range   WBC 10.4 4.0 - 10.5 K/uL   RBC 3.75 (L) 3.87 - 5.11 MIL/uL  Hemoglobin 11.6 (L) 12.0 - 15.0 g/dL   HCT 16.133.5 (L) 09.636.0 - 04.546.0 %   MCV 89.3 78.0 - 100.0 fL   MCH 30.9 26.0 - 34.0 pg   MCHC 34.6 30.0 - 36.0 g/dL   RDW 40.912.1 81.111.5 - 91.415.5 %   Platelets 227 150 - 400 K/uL   [place] Ob Transvaginal  Addendum Date: 02/16/2016   ADDENDUM REPORT: 02/16/2016 17:06 ADDENDUM: CLINICAL DATA: Assess viability. EXAM: OBSTETRIC <14 WK US AND TRANSVAGINAL OB US TECHNIQUE: Both transabdominal and transvaginal ultrasound examinations were performed for complete evaluation of the gestation as well as the maternal uterus, adnexal regions, and pelvic cul-de-sac. Transvaginal technique was performed to assess early pregnancy.  COMPARISON: 01/24/2016 FINDINGS: Intrauterine gestational sac: Single Yolk sac: Yes Embryo: yes Cardiac Activity: No Heart Rate: No bpm CRL:  1.79 cm 8w 1d Maternal uterus/adnexae: Subchorionic hemorrhage: None Right ovary: Normal Left ovary: Normal Other :None Free fluid: None IMPRESSION: 1. There is a single intrauterine gestational sac containing embryo and yolk sac. No cardiac activity visualize. Findings meet definitive criteria for failed pregnancy. This follows SRU consensus guidelines: Diagnostic Criteria for Nonviable Pregnancy Early in the First Trimester. Macy Mis Engl J Med [id number];369:1443-51. Electronically Signed   By: Signa Kellaylor  Stroud M.D.   On: 02/16/2016 17:06   Result Date: 02/16/2016 CLINICAL DATA:  Assess viability. EXAM: OBSTETRIC <14 WK US AND TRANSVAGINAL OB US TECHNIQUE: Both transabdominal and transvaginal ultrasound examinations were performed for complete evaluation of the gestation as well as the maternal uterus, adnexal regions, and pelvic cul-de-sac. Transvaginal technique was performed to assess early pregnancy. COMPARISON:  01/24/2016 FINDINGS: Intrauterine gestational sac: Single Yolk sac:  Yes Embryo:  No Cardiac Activity: No Heart Rate: No  bpm MSD: 10  mm   5 w   5  d Maternal uterus/adnexae: Subchorionic hemorrhage: None Right ovary: Normal Left ovary: Normal Other :None Free fluid:  None IMPRESSION: 1. There is a single intrauterine gestational sac which contains a yolk sac but no embryo. It has been 3 weeks and 2 days since the last ultrasound showed a intrauterine gestational sac with yolk sac. Findings meet definitive criteria for failed pregnancy. This follows SRU consensus guidelines: Diagnostic Criteria for Nonviable Pregnancy Early in the First Trimester. Macy Mis Engl J Med [id number]. Electronically Signed: By: Signa Kellaylor  Stroud M.D. On: 02/16/2016 16:26     MAU Course  Procedures  MDM Dr. Macon LargeAnyanwu discussed options with the patient. R/B/A discussed. Patient would like to  proceed with misoprostol.. Will get CBC, and if normal will DC home with miso and instructions on use.  Assessment and Plan   1. Missed abortion   2. [redacted] weeks gestation of pregnancy    DC home Comfort measures reviewed  Bleeding precautions RX: phenergan PRN #30, Vicodin PRN #20, ibuprofen PRN #30, Cytotec 800mcg as directed  Return to MAU as needed  Follow-up Information    [facility]   Specialty:  Obstetrics and Gynecology Why:  They will call you with an appointment  Contact information: [address] [place] [phone]           Tawnya CrookHogan, [place]ett Abbett Donovan 02/16/2016, 4:54 PM

## 2016-03-05 ENCOUNTER — Encounter: Payer: Medicaid Other | Admitting: Obstetrics and Gynecology

## 2016-04-02 ENCOUNTER — Telehealth: Payer: Self-pay | Admitting: *Deleted

## 2016-04-02 NOTE — Telephone Encounter (Signed)
Penny Becker called 04/01/16 am stating she had a prescription sent to the pharmacy months ago for birth control and wasn't able to get it due to medicaid issues. States now she went to get it and it wasn't there. Wants to know if we can send over another prescription?

## 2016-04-02 NOTE — Telephone Encounter (Signed)
I called Penny Becker back and we discussed that since she has had a recent miscarriage she needs to keep the already scheduled follow up appointment on 04/09/16 before we can refill birth control. I reviewed date/time of appointment. She voices understanding. I advised her to either abstain from intercourse or use condoms in the meantime so we can be sure levels have returned to normal.

## 2016-04-09 ENCOUNTER — Ambulatory Visit (INDEPENDENT_AMBULATORY_CARE_PROVIDER_SITE_OTHER): Payer: Medicaid Other | Admitting: Obstetrics and Gynecology

## 2016-04-09 ENCOUNTER — Other Ambulatory Visit (HOSPITAL_COMMUNITY)
Admission: RE | Admit: 2016-04-09 | Discharge: 2016-04-09 | Disposition: A | Discharge status: Home / Self Care | Payer: Medicaid Other | Source: Ambulatory Visit | Attending: Obstetrics and Gynecology | Admitting: Obstetrics and Gynecology

## 2016-04-09 ENCOUNTER — Encounter: Payer: Self-pay | Admitting: Obstetrics and Gynecology

## 2016-04-09 VITALS — BP 132/53 | HR 60

## 2016-04-09 DIAGNOSIS — Z202 Contact with and (suspected) exposure to infections with a predominantly sexual mode of transmission: Secondary | ICD-10-CM

## 2016-04-09 DIAGNOSIS — Z113 Encounter for screening for infections with a predominantly sexual mode of transmission: Secondary | ICD-10-CM | POA: Insufficient documentation

## 2016-04-09 DIAGNOSIS — Z30011 Encounter for initial prescription of contraceptive pills: Secondary | ICD-10-CM | POA: Diagnosis not present

## 2016-04-09 DIAGNOSIS — Z8759 Personal history of other complications of pregnancy, childbirth and the puerperium: Secondary | ICD-10-CM

## 2016-04-09 LAB — CBC
HCT: 37.8 % (ref 35.0–45.0)
Hemoglobin: 12.7 g/dL (ref 11.7–15.5)
MCH: 31.1 pg (ref 27.0–33.0)
MCHC: 33.6 g/dL (ref 32.0–36.0)
MCV: 92.6 fL (ref 80.0–100.0)
MPV: 9.8 fL (ref 7.5–12.5)
Platelets: 239 10*3/uL (ref 140–400)
RBC: 4.08 MIL/uL (ref 3.80–5.10)
RDW: 12.5 % (ref 11.0–15.0)
WBC: 6.3 10*3/uL (ref 3.8–10.8)

## 2016-04-09 NOTE — Progress Notes (Signed)
   Subjective:    Patient ID: Penny Becker, female    DOB: 05/15/1990, 25 y.o.   MRN: [id number]  Patient is a 25 year old G6 P2 who presents for follow-up from miscarriage. She was seen in October and diagnosed with a missed abortion. She was given misoprostol and states she bled for about 1 week past what she felt was tissue and her bleeding has subsided. She does report some fatigue and hot flashes but otherwise reports her health is back to normal. She does report that she was told by her partner's ask that she should get tested for STDs and is asking we do that today. Additionally she is interested in birth control. She has not had any follow-up from the ultrasound at this time. She reports no crampy abdominal pain and no vaginal discharge      Review of Systems  Constitutional: Negative for activity change and appetite change.  HENT: Negative for congestion and sinus pain.   Eyes: Negative for discharge and itching.  Respiratory: Negative for apnea and shortness of breath.   Cardiovascular: Negative for chest pain and leg swelling.  Gastrointestinal: Negative for abdominal distention, abdominal pain, constipation, diarrhea and nausea.  Endocrine: Negative for cold intolerance and heat intolerance.  Genitourinary: Negative for difficulty urinating, dyspareunia and dysuria.  Neurological: Negative for dizziness and headaches.       Objective:   Physical Exam  Constitutional: She appears well-developed and well-nourished.  HENT:  Head: Normocephalic and atraumatic.  Cardiovascular: Regular rhythm and intact distal pulses.   Pulmonary/Chest: Effort normal. No respiratory distress.  Abdominal: Soft. Bowel sounds are normal. She exhibits no distension. There is no tenderness. There is no rebound and no guarding.  Genitourinary: Vagina normal. No vaginal discharge found.  Genitourinary Comments: Cervix visualized os closed and normal  Skin: Skin is warm and dry.  Psychiatric: She  has a normal mood and affect. Her behavior is normal.          Assessment & Plan:  Possible exposure to STD - Plan: HIV antibody (with reflex), Hepatitis B Surface AntiGEN, RPR, Wet prep, genital, GC/Chlamydia probe amp ([place])not at [facility]  Miscarriage within last 12 months - Plan: CBC, hCG, quantitative, pregnancy  Encounter for initial prescription of contraceptive pills - Plan: hCG, quantitative, pregnancy   #1: Possible exposure to STD. Check labs as above. No signs at this time will call patient with results. #2: Miscarriage within the last 12 months has not had any follow-up to ensure her beta has gone to normal. Will check with lab work additionally we'll check a CBC as she is reporting fatigue since bleeding #3: Patient would like to start combination oral contraceptive pills. Await for beta to be 0 if normal will call and pharmacy.

## 2016-04-09 NOTE — Patient Instructions (Signed)
Oral Contraception Information Oral contraceptive pills (OCPs) are medicines taken to prevent pregnancy. OCPs work by preventing the ovaries from releasing eggs. The hormones in OCPs also cause the cervical mucus to thicken, preventing the sperm from entering the uterus. The hormones also cause the uterine lining to become thin, not allowing a fertilized egg to attach to the inside of the uterus. OCPs are highly effective when taken exactly as prescribed. However, OCPs do not prevent sexually transmitted diseases (STDs). Safe sex practices, such as using condoms along with the pill, can help prevent STDs.  Before taking the pill, you may have a physical exam and Pap test. Your health care provider may order blood tests. The health care provider will make sure you are a good candidate for oral contraception. Discuss with your health care provider the possible side effects of the OCP you may be prescribed. When starting an OCP, it can take 2 to 3 months for the body to adjust to the changes in hormone levels in your body.  TYPES OF ORAL CONTRACEPTION  The combination pill-This pill contains estrogen and progestin (synthetic progesterone) hormones. The combination pill comes in 21-day, 28-day, or 91-day packs. Some types of combination pills are meant to be taken continuously (365-day pills). With 21-day packs, you do not take pills for 7 days after the last pill. With 28-day packs, the pill is taken every day. The last 7 pills are without hormones. Certain types of pills have more than 21 hormone-containing pills. With 91-day packs, the first 84 pills contain both hormones, and the last 7 pills contain no hormones or contain estrogen only.  The minipill-This pill contains the progesterone hormone only. The pill is taken every day continuously. It is very important to take the pill at the same time each day. The minipill comes in packs of 28 pills. All 28 pills contain the hormone.  ADVANTAGES OF ORAL  CONTRACEPTIVE PILLS  Decreases premenstrual symptoms.   Treats menstrual period cramps.   Regulates the menstrual cycle.   Decreases a heavy menstrual flow.   May treatacne, depending on the type of pill.   Treats abnormal uterine bleeding.   Treats polycystic ovarian syndrome.   Treats endometriosis.   Can be used as emergency contraception.  THINGS THAT CAN MAKE ORAL CONTRACEPTIVE PILLS LESS EFFECTIVE OCPs can be less effective if:   You forget to take the pill at the same time every day.   You have a stomach or intestinal disease that lessens the absorption of the pill.   You take OCPs with other medicines that make OCPs less effective, such as antibiotics, certain HIV medicines, and some seizure medicines.   You take expired OCPs.   You forget to restart the pill on day 7, when using the packs of 21 pills.  RISKS ASSOCIATED WITH ORAL CONTRACEPTIVE PILLS  Oral contraceptive pills can sometimes cause side effects, such as:  Headache.  Nausea.  Breast tenderness.  Irregular bleeding or spotting. Combination pills are also associated with a small increased risk of:  Blood clots.  Heart attack.  Stroke. This information is not intended to replace advice given to you by your health care provider. Make sure you discuss any questions you have with your health care provider. Document Released: 07/13/2002 Document Revised: 08/14/2015 Document Reviewed: 10/11/2012 Elsevier Interactive Patient Education  2017 [employer]  

## 2016-04-09 NOTE — Progress Notes (Signed)
STD testing

## 2016-04-10 LAB — WET PREP, GENITAL
Trich, Wet Prep: NONE SEEN
Yeast Wet Prep HPF POC: NONE SEEN

## 2016-04-10 LAB — HIV ANTIBODY (ROUTINE TESTING W REFLEX): HIV 1&2 Ab, 4th Generation: NONREACTIVE

## 2016-04-10 LAB — HEPATITIS B SURFACE ANTIGEN: Hepatitis B Surface Ag: NEGATIVE

## 2016-04-10 LAB — HCG, QUANTITATIVE, PREGNANCY: hCG, Beta Chain, Quant, S: 10.3 m[IU]/mL — ABNORMAL HIGH

## 2016-04-10 LAB — RPR

## 2016-04-10 LAB — GC/CHLAMYDIA PROBE AMP (~~LOC~~) NOT AT ARMC
Chlamydia: NEGATIVE
Neisseria Gonorrhea: NEGATIVE

## 2016-04-15 ENCOUNTER — Other Ambulatory Visit: Payer: Medicaid Other

## 2016-04-15 DIAGNOSIS — Z8759 Personal history of other complications of pregnancy, childbirth and the puerperium: Secondary | ICD-10-CM

## 2016-04-16 ENCOUNTER — Telehealth: Payer: Self-pay | Admitting: *Deleted

## 2016-04-16 LAB — HCG, QUANTITATIVE, PREGNANCY: hCG, Beta Chain, Quant, S: 8.3 m[IU]/mL — ABNORMAL HIGH

## 2016-04-16 NOTE — Telephone Encounter (Addendum)
Penny Becker called to get her lab results from yesterday. I spoke to Penny Becker and advised that the level of pregnancy hormone did go down but not very much. I will talk with a provider and call her back with further information re: plan of care.  1625  Called Penny Becker after consult w/Dr. Debroah LoopArnold.  Penny Becker was advised that we will do repeat hormone level test in [redacted] week along with a urine pregnancy test. Some people will have low level pregnancy hormone levels even though not pregnant. Next week if her blood pregnancy hormone level is still present but the urine test is negative, we will not need to test any further. Penny Becker also advised that she may begin taking the birth control pills as prescribed.  She will need a back up method such as condoms for the duration of her first pack of pills. Penny Becker voiced understanding of all information and agreed to lab appt on 12/19 @ 1100.

## 2016-04-18 ENCOUNTER — Telehealth: Payer: Self-pay | Admitting: Obstetrics and Gynecology

## 2016-04-18 MED ORDER — LEVONORGESTREL-ETHINYL ESTRAD 0.15-30 MG-MCG PO TABS: 1.0000 | ORAL_TABLET | Freq: Every day | ORAL | 11 refills | Status: DC

## 2016-04-18 NOTE — Telephone Encounter (Signed)
Informed patient of decreasing bHCG. Discussed results with Dr. Alysia PennaErvin who was OK with starting OCPs. recommended patient call for a 3 month follow up appointment. Would consider recheck bHcG at that visit. Penny Becker called into patients pharmacy.

## 2016-04-23 ENCOUNTER — Ambulatory Visit: Payer: Medicaid Other

## 2016-05-06 NOTE — L&D Delivery Note (Signed)
Patient is a 26 y.o. now [id number] s/p NSVD at 6644w1d, who was admitted today for IOL for GHTN. S/p IOL with FB and cytotec, followed by IV Pit. SROM at 10:15 am with clear fluid.  Delivery Note At 8:24 PM a viable female was delivered via Vaginal, Spontaneous Delivery (Presentation: LOA ).  APGAR: 9, 9; weight  pending Placenta status: intact, to L&D.  Cord:  3-vessel  Anesthesia:  Epidural Episiotomy:  none Lacerations:  none Suture Repair: none Est. Blood Loss (mL):  100  Patient complete started pushing at 20:15. With good maternal effort, head delivered LOA. No nuchal cord present. Shoulder and body delivered easily in usual fashion. Infant with spontaneous cry, placed on mother's abdomen, dried and bulb suctioned. Cord clamped x 2 after 1-minute delay, and cut by family member. Cord blood drawn. Placenta delivered spontaneously with gentle cord traction. Fundus firm with massage and Pitocin. Perineum inspected and found to have no lacerations.  Mom to postpartum.  Baby to Couplet care / Skin to Skin.  Raynelle FanningJulie P. Degele, MD OB Fellow 02/22/17, 8:37 PM

## 2016-05-10 ENCOUNTER — Telehealth: Payer: Self-pay

## 2016-05-10 NOTE — Telephone Encounter (Signed)
Received message on Nurse Line. Patient had miscarriage on 02/16/2016. She has been having abnormal bleeding and wanted to know if this was normal.

## 2016-05-12 ENCOUNTER — Inpatient Hospital Stay (HOSPITAL_COMMUNITY)
Admission: AD | Admit: 2016-05-12 | Discharge: 2016-05-13 | Disposition: A | Discharge status: Home / Self Care | Payer: Medicaid Other | Source: Ambulatory Visit | Attending: Obstetrics and Gynecology | Admitting: Obstetrics and Gynecology

## 2016-05-12 DIAGNOSIS — N939 Abnormal uterine and vaginal bleeding, unspecified: Secondary | ICD-10-CM | POA: Diagnosis present

## 2016-05-12 DIAGNOSIS — N3000 Acute cystitis without hematuria: Secondary | ICD-10-CM | POA: Diagnosis not present

## 2016-05-12 LAB — URINALYSIS, ROUTINE W REFLEX MICROSCOPIC
Glucose, UA: NEGATIVE mg/dL
Ketones, ur: 15 mg/dL — AB
Nitrite: POSITIVE — AB
Protein, ur: 100 mg/dL — AB
Specific Gravity, Urine: >1.03 — ABNORMAL HIGH (ref 1.005–1.030)
pH: 5 (ref 5.0–8.0)

## 2016-05-12 LAB — CBC
HCT: 36.6 % (ref 36.0–46.0)
Hemoglobin: 12.5 g/dL (ref 12.0–15.0)
MCH: 30.8 pg (ref 26.0–34.0)
MCHC: 34.2 g/dL (ref 30.0–36.0)
MCV: 90.1 fL (ref 78.0–100.0)
Platelets: 235 10*3/uL (ref 150–400)
RBC: 4.06 MIL/uL (ref 3.87–5.11)
RDW: 11.8 % (ref 11.5–15.5)
WBC: 8.7 10*3/uL (ref 4.0–10.5)

## 2016-05-12 LAB — URINALYSIS, MICROSCOPIC (REFLEX)

## 2016-05-12 LAB — HCG, QUANTITATIVE, PREGNANCY: hCG, Beta Chain, Quant, S: <1 m[IU]/mL (ref <5)

## 2016-05-12 LAB — POCT PREGNANCY, URINE: Preg Test, Ur: NEGATIVE

## 2016-05-12 NOTE — MAU Note (Signed)
Pt had miscarriage in October;  her HCG  Level was down to an 8 in December per pt.   She reports vag bleeding every other day, with low abd cramping randomly since Dec 15.Marland Kitchen. She was given OCP to start in Dec, but she states she and her partner desire to become pregnant.

## 2016-05-12 NOTE — MAU Provider Note (Signed)
History   [id number]   Chief Complaint  Patient presents with  . Vaginal Bleeding    HPI Penny MeyerKelly Blattner is a 26 y.o. female  [id number] here with report of continued irregular bleeding since having miscarriage in October 2017.  Bleeding described as light some days and more than a period on others.  Bleeding last week was reported as heavy with the passage of "tissue" and clots and soaking thru pads.  Bleeding became heavy again yesterday and was associated with cramping.  Has not taken the OCPs that were previously prescribed.  BHCG was 8.3 on last check on 04/15/16 and patient desires to know if it has resumed to nonpregnant levels.  Denies dizziness, shortness of breath, or palpitations.    Patient's last menstrual period was 04/19/2016 (exact date).  OB History  Gravida Para Term Preterm AB Living  6 2 2  0 3 2  SAB TAB Ectopic Multiple Live Births  1 2 0 0 2    # Outcome Date GA Lbr Len/2nd Weight Sex Delivery Anes PTL Lv  6 Gravida           5 Term 05/27/11 164w2d 07:37 / 01:06 6 lb 13.2 oz (3.095 kg) M Vag-Spont EPI  LIV  4 TAB 2011          3 TAB 01/11/09          2 SAB 09/01/08          1 Term 07/11/07 1075w0d  6 lb 4 oz (2.835 kg) F Vag-Spont EPI  LIV     Birth Comments: Induced for preeclampsia      Past Medical History:  Diagnosis Date  . Abnormal Pap smear   . Anemia    1st pregnancy, tx'd with iron  . Chlamydia   . History of recurrent UTIs last one 2 months ago  . Prior pregnancy complicated by PIH, antepartum    during 1st pregnancy  . Subchorionic hemorrhage 10/22/10    Family History  Problem Relation Age of Onset  . Heart disease Maternal Grandmother   . Liver disease Maternal Grandmother   . Heart disease Maternal Grandfather   . Liver disease Maternal Grandfather   . Schizophrenia Paternal Grandmother   . Diabetes Paternal Grandmother   . Mental illness Paternal Grandmother   . Hypertension Father   . Kidney disease Daughter     horse shoe kidneys  recurrent UTI  . Hypertension Mother   . Anesthesia problems Neg Hx     Social History   Social History  . Marital status: Single    Spouse name: N/A  . Number of children: N/A  . Years of education: N/A   Social History Main Topics  . Smoking status: Former Smoker    Packs/day: 1.00    Quit date: 01/20/2016  . Smokeless tobacco: Never Used  . Alcohol use No  . Drug use: No  . Sexual activity: Yes    Birth control/ protection: None   Other Topics Concern  . Not on file   Social History Narrative  . No narrative on file    No Known Allergies  No current facility-administered medications on file prior to encounter.    Current Outpatient Prescriptions on File Prior to Encounter  Medication Sig Dispense Refill  . bisacodyl (DULCOLAX) 10 MG suppository Place 1 suppository (10 mg total) rectally as needed for moderate constipation. 12 suppository 0  . docusate sodium (COLACE) 100 MG capsule Take 1 capsule (100 mg total) by mouth  2 (two) times daily. 30 capsule 2  . levonorgestrel-ethinyl estradiol (NORDETTE) 0.15-30 MG-MCG tablet Take 1 tablet by mouth daily. 1 Package 11     Review of Systems  Constitutional: Negative for chills and fever.  Gastrointestinal: Negative for constipation, diarrhea, nausea and vomiting.  Genitourinary: Positive for menstrual problem, pelvic pain and vaginal bleeding. Negative for dysuria.  Neurological: Negative for dizziness, light-headedness and headaches.  All other systems reviewed and are negative.    Physical Exam   Vitals:   05/12/16 2205  BP: 122/75  Pulse: 83  Resp: 16  Temp: 98.4 F (36.9 C)  TempSrc: Oral  Weight: 168 lb (76.2 kg)  Height: 5\' 7"  (1.702 m)    Physical Exam  Constitutional: She is oriented to person, place, and time. She appears well-developed and well-nourished.  HENT:  Head: Normocephalic.  Neck: Normal range of motion. Neck supple.  Cardiovascular: Normal rate, regular rhythm and normal heart  sounds.   Respiratory: Effort normal and breath sounds normal.  GI: Soft. She exhibits no mass. There is tenderness (midepigastric region). There is no guarding.  Genitourinary: There is bleeding (negative clots; moderate bleeding) in the vagina.  Neurological: She is alert and oriented to person, place, and time. She has normal reflexes.  Skin: Skin is warm and dry.    MAU Course  Procedures  MDM Results for orders placed or performed during the hospital encounter of 05/12/16 (from the past 24 hour(s))  Urinalysis, Routine w reflex microscopic     Status: Abnormal   Collection Time: 05/12/16  9:58 PM  Result Value Ref Range   Color, Urine ORANGE (A) YELLOW   APPearance CLEAR CLEAR   Specific Gravity, Urine >1.030 (H) 1.005 - 1.030   pH 5.0 5.0 - 8.0   Glucose, UA NEGATIVE NEGATIVE mg/dL   Hgb urine dipstick LARGE (A) NEGATIVE   Bilirubin Urine SMALL (A) NEGATIVE   Ketones, ur 15 (A) NEGATIVE mg/dL   Protein, ur 409 (A) NEGATIVE mg/dL   Nitrite POSITIVE (A) NEGATIVE   Leukocytes, UA TRACE (A) NEGATIVE  Urinalysis, Microscopic (reflex)     Status: Abnormal   Collection Time: 05/12/16  9:58 PM  Result Value Ref Range   RBC / HPF 6-30 0 - 5 RBC/hpf   WBC, UA 0-5 0 - 5 WBC/hpf   Bacteria, UA MANY (A) NONE SEEN   Squamous Epithelial / LPF 0-5 (A) NONE SEEN  Pregnancy, urine POC     Status: None   Collection Time: 05/12/16 10:04 PM  Result Value Ref Range   Preg Test, Ur NEGATIVE NEGATIVE  CBC     Status: None   Collection Time: 05/12/16 10:38 PM  Result Value Ref Range   WBC 8.7 4.0 - 10.5 K/uL   RBC 4.06 3.87 - 5.11 MIL/uL   Hemoglobin 12.5 12.0 - 15.0 g/dL   HCT 81.1 91.4 - 78.2 %   MCV 90.1 78.0 - 100.0 fL   MCH 30.8 26.0 - 34.0 pg   MCHC 34.2 30.0 - 36.0 g/dL   RDW 95.6 21.3 - 08.6 %   Platelets 235 150 - 400 K/uL  hCG, quantitative, pregnancy     Status: None   Collection Time: 05/12/16 10:38 PM  Result Value Ref Range   hCG, Beta Chain, Quant, S <1 <5 mIU/mL      Assessment and Plan   UTI Abnormal Uterine Bleeding  Plan: Discharge home Urine culture RX Keflex 500 mg tid x 7 days Provera 10 mg x 5  days or begin OCPs Follow-up if no improvement or worsening of symptoms  Marlis Edelson, CNM 05/12/2016 11:59 PM

## 2016-05-13 DIAGNOSIS — N3000 Acute cystitis without hematuria: Secondary | ICD-10-CM

## 2016-05-13 MED ORDER — CEPHALEXIN 500 MG PO CAPS: 500.0000 mg | ORAL_CAPSULE | Freq: Three times a day (TID) | ORAL | 0 refills | Status: DC

## 2016-05-13 NOTE — Telephone Encounter (Signed)
I called Penny Becker and was unable to leave a message -

## 2016-05-14 LAB — URINE CULTURE: Culture: <10000 — AB

## 2016-05-15 NOTE — Telephone Encounter (Signed)
Patient's concerns were addressed in MAU

## 2016-07-07 ENCOUNTER — Inpatient Hospital Stay (HOSPITAL_COMMUNITY)
Admission: AD | Admit: 2016-07-07 | Discharge: 2016-07-08 | Disposition: A | Discharge status: Home / Self Care | Payer: Medicaid Other | Source: Ambulatory Visit | Attending: Obstetrics and Gynecology | Admitting: Obstetrics and Gynecology

## 2016-07-07 DIAGNOSIS — O3680X0 Pregnancy with inconclusive fetal viability, not applicable or unspecified: Secondary | ICD-10-CM

## 2016-07-07 DIAGNOSIS — Z87891 Personal history of nicotine dependence: Secondary | ICD-10-CM | POA: Insufficient documentation

## 2016-07-07 DIAGNOSIS — O26891 Other specified pregnancy related conditions, first trimester: Secondary | ICD-10-CM | POA: Insufficient documentation

## 2016-07-07 DIAGNOSIS — R109 Unspecified abdominal pain: Secondary | ICD-10-CM

## 2016-07-07 DIAGNOSIS — R1032 Left lower quadrant pain: Secondary | ICD-10-CM | POA: Insufficient documentation

## 2016-07-07 DIAGNOSIS — Z3A01 Less than 8 weeks gestation of pregnancy: Secondary | ICD-10-CM | POA: Insufficient documentation

## 2016-07-08 ENCOUNTER — Encounter (HOSPITAL_COMMUNITY): Payer: Self-pay | Admitting: Advanced Practice Midwife

## 2016-07-08 ENCOUNTER — Inpatient Hospital Stay (HOSPITAL_COMMUNITY): Payer: Medicaid Other

## 2016-07-08 DIAGNOSIS — O9989 Other specified diseases and conditions complicating pregnancy, childbirth and the puerperium: Secondary | ICD-10-CM | POA: Diagnosis not present

## 2016-07-08 DIAGNOSIS — R109 Unspecified abdominal pain: Secondary | ICD-10-CM | POA: Diagnosis not present

## 2016-07-08 DIAGNOSIS — R1032 Left lower quadrant pain: Secondary | ICD-10-CM | POA: Diagnosis present

## 2016-07-08 DIAGNOSIS — Z3A01 Less than 8 weeks gestation of pregnancy: Secondary | ICD-10-CM | POA: Diagnosis not present

## 2016-07-08 DIAGNOSIS — Z87891 Personal history of nicotine dependence: Secondary | ICD-10-CM | POA: Diagnosis not present

## 2016-07-08 DIAGNOSIS — O26891 Other specified pregnancy related conditions, first trimester: Secondary | ICD-10-CM | POA: Diagnosis not present

## 2016-07-08 LAB — URINALYSIS, ROUTINE W REFLEX MICROSCOPIC
Bilirubin Urine: NEGATIVE
Glucose, UA: NEGATIVE mg/dL
Hgb urine dipstick: NEGATIVE
Ketones, ur: 15 mg/dL — AB
Leukocytes, UA: NEGATIVE
Nitrite: NEGATIVE
Protein, ur: NEGATIVE mg/dL
Specific Gravity, Urine: 1.015 (ref 1.005–1.030)
pH: 6 (ref 5.0–8.0)

## 2016-07-08 LAB — WET PREP, GENITAL
Sperm: NONE SEEN
Trich, Wet Prep: NONE SEEN
Yeast Wet Prep HPF POC: NONE SEEN

## 2016-07-08 LAB — HCG, QUANTITATIVE, PREGNANCY: hCG, Beta Chain, Quant, S: 676 m[IU]/mL — ABNORMAL HIGH (ref <5)

## 2016-07-08 LAB — CBC
HCT: 36.6 % (ref 36.0–46.0)
Hemoglobin: 12.3 g/dL (ref 12.0–15.0)
MCH: 30.6 pg (ref 26.0–34.0)
MCHC: 33.6 g/dL (ref 30.0–36.0)
MCV: 91 fL (ref 78.0–100.0)
Platelets: 220 10*3/uL (ref 150–400)
RBC: 4.02 MIL/uL (ref 3.87–5.11)
RDW: 11.9 % (ref 11.5–15.5)
WBC: 7.3 10*3/uL (ref 4.0–10.5)

## 2016-07-08 NOTE — MAU Note (Signed)
Patient presents to MAU with c/o +HPT (4 tests) and intermittent left sided pain. Patient states she was seen here in January for bleeding when she had a negative home preg test. History of miscarriage in Oct 2017. Denies bleeding.

## 2016-07-08 NOTE — MAU Provider Note (Signed)
Chief Complaint: No chief complaint on file.   First Provider Initiated Contact with Patient 07/08/16 0143      SUBJECTIVE HPI: Penny Becker is a 26 y.o. [id number] @[redacted]w[redacted]d  by unsure LMP who presents to maternity admissions reporting intermittent LLQ pain x 2 weeks that resolved a few days ago and positive pregnancy test x4 at home with negative test in January. She reports irregular bleeding off and on in January, then a light 1 day period on 06/07/16 with no bleeding since then.  She denies pain today. She did have UTI in January and started her Keflex but stopped the medication then restarted and completed it a few days ago.  She reports her pain may have been related to UTI since it is better now.  She had a miscarriage last year so she was worried about this and came in tonight.  There are no associated symptoms. She denies vaginal bleeding, vaginal itching/burning, urinary symptoms, h/a, dizziness, n/v, or fever/chills.     HPI  Past Medical History:  Diagnosis Date  . Abnormal Pap smear   . Anemia    1st pregnancy, tx'd with iron  . Chlamydia   . History of recurrent UTIs last one 2 months ago  . Prior pregnancy complicated by PIH, antepartum    during 1st pregnancy  . Subchorionic hemorrhage 10/22/10   Past Surgical History:  Procedure Laterality Date  . DILATION AND CURETTAGE OF UTERUS    . INDUCED ABORTION     2010, 2011   Social History   Social History  . Marital status: Single    Spouse name: N/A  . Number of children: N/A  . Years of education: N/A   Occupational History  . Not on file.   Social History Main Topics  . Smoking status: Former Smoker    Packs/day: 1.00    Quit date: 01/20/2016  . Smokeless tobacco: Never Used  . Alcohol use No  . Drug use: No  . Sexual activity: Yes    Birth control/ protection: None   Other Topics Concern  . Not on file   Social History Narrative  . No narrative on file   No current facility-administered medications on  file prior to encounter.    Current Outpatient Prescriptions on File Prior to Encounter  Medication Sig Dispense Refill  . bisacodyl (DULCOLAX) 10 MG suppository Place 1 suppository (10 mg total) rectally as needed for moderate constipation. 12 suppository 0  . cephALEXin (KEFLEX) 500 MG capsule Take 1 capsule (500 mg total) by mouth 3 (three) times daily. 21 capsule 0  . levonorgestrel-ethinyl estradiol (NORDETTE) 0.15-30 MG-MCG tablet Take 1 tablet by mouth daily. 1 Package 11   No Known Allergies  ROS:  Review of Systems  Constitutional: Negative for chills, fatigue and fever.  Respiratory: Negative for shortness of breath.   Cardiovascular: Negative for chest pain.  Gastrointestinal: Negative for nausea and vomiting.  Genitourinary: Negative for difficulty urinating, dysuria, flank pain, pelvic pain, vaginal bleeding, vaginal discharge and vaginal pain.  Neurological: Negative for dizziness and headaches.  Psychiatric/Behavioral: Negative.      I have reviewed patient's Past Medical Hx, Surgical Hx, Family Hx, Social Hx, medications and allergies.   Physical Exam  Patient Vitals for the past 24 hrs:  BP Temp Temp src Pulse Resp Height Weight  07/08/16 0017 - - - - - 5' 7.5" (1.715 m) 168 lb 12.8 oz (76.6 kg)  07/08/16 0015 142/88 98.2 F (36.8 C) Oral 85 16 - -  Constitutional: Well-developed, well-nourished female in no acute distress.  Cardiovascular: normal rate Respiratory: normal effort GI: Abd soft, non-tender. Pos BS x 4 MS: Extremities nontender, no edema, normal ROM Neurologic: Alert and oriented x 4.  GU: Neg CVAT.  PELVIC EXAM: Wet prep/GCC collected by blind swab  LAB RESULTS Results for orders placed or performed during the hospital encounter of 07/07/16 (from the past 24 hour(s))  hCG, quantitative, pregnancy     Status: Abnormal   Collection Time: 07/08/16 12:06 AM  Result Value Ref Range   hCG, Beta Chain, Quant, S 676 (H) <5 mIU/mL  CBC     Status:  None   Collection Time: 07/08/16 12:06 AM  Result Value Ref Range   WBC 7.3 4.0 - 10.5 K/uL   RBC 4.02 3.87 - 5.11 MIL/uL   Hemoglobin 12.3 12.0 - 15.0 g/dL   HCT 60.436.6 54.036.0 - 98.146.0 %   MCV 91.0 78.0 - 100.0 fL   MCH 30.6 26.0 - 34.0 pg   MCHC 33.6 30.0 - 36.0 g/dL   RDW 19.111.9 47.811.5 - 29.515.5 %   Platelets 220 150 - 400 K/uL  Wet prep, genital     Status: Abnormal   Collection Time: 07/08/16  1:25 AM  Result Value Ref Range   Yeast Wet Prep HPF POC NONE SEEN NONE SEEN   Trich, Wet Prep NONE SEEN NONE SEEN   Clue Cells Wet Prep HPF POC PRESENT (A) NONE SEEN   WBC, Wet Prep HPF POC FEW (A) NONE SEEN   Sperm NONE SEEN   Urinalysis, Routine w reflex microscopic     Status: Abnormal   Collection Time: 07/08/16  1:29 AM  Result Value Ref Range   Color, Urine YELLOW YELLOW   APPearance CLEAR CLEAR   Specific Gravity, Urine 1.015 1.005 - 1.030   pH 6.0 5.0 - 8.0   Glucose, UA NEGATIVE NEGATIVE mg/dL   Hgb urine dipstick NEGATIVE NEGATIVE   Bilirubin Urine NEGATIVE NEGATIVE   Ketones, ur 15 (A) NEGATIVE mg/dL   Protein, ur NEGATIVE NEGATIVE mg/dL   Nitrite NEGATIVE NEGATIVE   Leukocytes, UA NEGATIVE NEGATIVE       IMAGING [place] Ob Comp Less 14 Wks  Result Date: 07/08/2016 CLINICAL DATA:  Left-sided abdominal pain for 1 month. Quantitative HCG 679. EXAM: OBSTETRIC <14 WK US AND TRANSVAGINAL OB US TECHNIQUE: Both transabdominal and transvaginal ultrasound examinations were performed for complete evaluation of the gestation as well as the maternal uterus, adnexal regions, and pelvic cul-de-sac. Transvaginal technique was performed to assess early pregnancy. COMPARISON:  None. FINDINGS: Intrauterine gestational sac: Not visible Yolk sac:  Not visible Embryo:  Not visible Cardiac Activity: Not visible Heart Rate:   bpm MSD:   mm    w     d CRL:    mm    w    d                  US EDC: Subchorionic hemorrhage:  NA Maternal uterus/adnexae: Normal ovaries.  No free pelvic fluid. IMPRESSION: No visible  gestational sac. No adnexal abnormality or abnormal fluid collection. Recommend follow-up quantitative B-HCG levels and follow-up US to assess viability. Electronically Signed   By: Ellery Plunkaniel R Mitchell M.D.   On: 07/08/2016 01:23   [place] Ob Transvaginal  Result Date: 07/08/2016 CLINICAL DATA:  Left-sided abdominal pain for 1 month. Quantitative HCG 679. EXAM: OBSTETRIC <14 WK US AND TRANSVAGINAL OB US TECHNIQUE: Both transabdominal and transvaginal ultrasound examinations were performed for  complete evaluation of the gestation as well as the maternal uterus, adnexal regions, and pelvic cul-de-sac. Transvaginal technique was performed to assess early pregnancy. COMPARISON:  None. FINDINGS: Intrauterine gestational sac: Not visible Yolk sac:  Not visible Embryo:  Not visible Cardiac Activity: Not visible Heart Rate:   bpm MSD:   mm    w     d CRL:    mm    w    d                  [place] EDC: Subchorionic hemorrhage:  NA Maternal uterus/adnexae: Normal ovaries.  No free pelvic fluid. IMPRESSION: No visible gestational sac. No adnexal abnormality or abnormal fluid collection. Recommend follow-up quantitative B-HCG levels and follow-up US to assess viability. Electronically Signed   By: Ellery Plunk M.D.   On: 07/08/2016 01:23    MAU Management/MDM: Ordered labs and [place] and reviewed results.  Findings today could represent a normal early pregnancy, spontaneous abortion or ectopic pregnancy which can be life-threatening.  Ectopic precautions were given to the patient with plan to return to the office in 2 days for repeat quant hcg to evaluate pregnancy development.   Pt stable at time of discharge.  ASSESSMENT 1. Pregnancy of unknown anatomic location   2. Abdominal pain during pregnancy in first trimester     PLAN Discharge home with ectopic precautions  Allergies as of 07/08/2016   No Known Allergies     Medication List    STOP taking these medications   levonorgestrel-ethinyl estradiol 0.15-30 MG-MCG  tablet Commonly known as:  NORDETTE     TAKE these medications   bisacodyl 10 MG suppository Commonly known as:  DULCOLAX Place 1 suppository (10 mg total) rectally as needed for moderate constipation.   cephALEXin 500 MG capsule Commonly known as:  KEFLEX Take 1 capsule (500 mg total) by mouth 3 (three) times daily.      Follow-up Information    [facility].   Specialty:  Obstetrics and Gynecology Why:  You have an appointment at [facility] in the [facility] office (the clinic) for labs on Wednesday 07/10/16 at 11:00 am.  Return to MAU as needed for emergencies. Contact information: [address] [place] [address] [phone]          Sharen Counter Certified Nurse-Midwife 07/08/2016  1:59 AM

## 2016-07-08 NOTE — Discharge Instructions (Signed)
Abdominal Pain During Pregnancy  Abdominal pain is common in pregnancy. Most of the time, it does not cause harm. There are many causes of abdominal pain. Some causes are more serious than others and sometimes the cause is not known. Abdominal pain can be a sign that something is very wrong with the pregnancy or the pain may have nothing to do with the pregnancy. Always tell your health care provider if you have any abdominal pain.  Follow these instructions at home:  · Do not have sex or put anything in your vagina until your symptoms go away completely.  · Watch your abdominal pain for any changes.  · Get plenty of rest until your pain improves.  · Drink enough fluid to keep your urine clear or pale yellow.  · Take over-the-counter or prescription medicines only as told by your health care provider.  · Keep all follow-up visits as told by your health care provider. This is important.  Contact a health care provider if:  · You have a fever.  · Your pain gets worse or you have cramping.  · Your pain continues after resting.  Get help right away if:  · You are bleeding, leaking fluid, or passing tissue from the vagina.  · You have vomiting or diarrhea that does not go away.  · You have painful or bloody urination.  · You notice a decrease in your baby's movements.  · You feel very weak or faint.  · You have shortness of breath.  · You develop a severe headache with abdominal pain.  · You have abnormal vaginal discharge with abdominal pain.  This information is not intended to replace advice given to you by your health care provider. Make sure you discuss any questions you have with your health care provider.  Document Released: 04/22/2005 Document Revised: 02/01/2016 Document Reviewed: 11/19/2012  Elsevier Interactive Patient Education © 2017 [employer]

## 2016-07-09 LAB — GC/CHLAMYDIA PROBE AMP (~~LOC~~) NOT AT ARMC
Chlamydia: NEGATIVE
Neisseria Gonorrhea: NEGATIVE

## 2016-07-10 ENCOUNTER — Encounter: Payer: Self-pay | Admitting: General Practice

## 2016-07-10 ENCOUNTER — Ambulatory Visit: Payer: Medicaid Other

## 2016-07-14 ENCOUNTER — Inpatient Hospital Stay (HOSPITAL_COMMUNITY): Payer: Medicaid Other

## 2016-07-14 ENCOUNTER — Inpatient Hospital Stay (HOSPITAL_COMMUNITY)
Admission: AD | Admit: 2016-07-14 | Discharge: 2016-07-14 | Disposition: A | Discharge status: Home / Self Care | Payer: Medicaid Other | Source: Ambulatory Visit | Attending: Obstetrics & Gynecology | Admitting: Obstetrics & Gynecology

## 2016-07-14 DIAGNOSIS — O3680X0 Pregnancy with inconclusive fetal viability, not applicable or unspecified: Secondary | ICD-10-CM | POA: Diagnosis not present

## 2016-07-14 DIAGNOSIS — R1032 Left lower quadrant pain: Secondary | ICD-10-CM | POA: Diagnosis present

## 2016-07-14 DIAGNOSIS — Z87891 Personal history of nicotine dependence: Secondary | ICD-10-CM | POA: Diagnosis not present

## 2016-07-14 DIAGNOSIS — O26891 Other specified pregnancy related conditions, first trimester: Secondary | ICD-10-CM | POA: Insufficient documentation

## 2016-07-14 DIAGNOSIS — Z3A01 Less than 8 weeks gestation of pregnancy: Secondary | ICD-10-CM | POA: Insufficient documentation

## 2016-07-14 DIAGNOSIS — R109 Unspecified abdominal pain: Secondary | ICD-10-CM | POA: Diagnosis not present

## 2016-07-14 DIAGNOSIS — O26899 Other specified pregnancy related conditions, unspecified trimester: Secondary | ICD-10-CM

## 2016-07-14 LAB — HCG, QUANTITATIVE, PREGNANCY: hCG, Beta Chain, Quant, S: 8082 m[IU]/mL — ABNORMAL HIGH (ref <5)

## 2016-07-14 NOTE — MAU Note (Signed)
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  Pain in LLQ has continued, not worse, just continues.

## 2016-07-14 NOTE — MAU Provider Note (Signed)
History     CSN: [id number]  Arrival date and time: 07/14/16 1424   First Provider Initiated Contact with Patient 07/14/16 1608      Chief Complaint  Patient presents with  . Follow-up   Penny Becker  is a 26 y.o. [id number] at 4313w2d who presents to MAU today for follow-up quant hCG after 48 hours. The patient was seen in MAU on 07/08/16 and had quant hCG of 676 and US showed no IUGS, YS, or FP. She endorses LLQ pain but not worsened then before. No VB.     OB History    Gravida Para Term Preterm AB Living   7 2 2  0 3 2   SAB TAB Ectopic Multiple Live Births   1 2 0 0 2      Past Medical History:  Diagnosis Date  . Abnormal Pap smear   . Anemia    1st pregnancy, tx'd with iron  . Chlamydia   . History of recurrent UTIs last one 2 months ago  . Prior pregnancy complicated by PIH, antepartum    during 1st pregnancy  . Subchorionic hemorrhage 10/22/10    Past Surgical History:  Procedure Laterality Date  . DILATION AND CURETTAGE OF UTERUS    . INDUCED ABORTION     2010, 2011    Family History  Problem Relation Age of Onset  . Heart disease Maternal Grandmother   . Liver disease Maternal Grandmother   . Heart disease Maternal Grandfather   . Liver disease Maternal Grandfather   . Schizophrenia Paternal Grandmother   . Diabetes Paternal Grandmother   . Mental illness Paternal Grandmother   . Hypertension Father   . Kidney disease Daughter     horse shoe kidneys recurrent UTI  . Hypertension Mother   . Anesthesia problems Neg Hx     Social History  Substance Use Topics  . Smoking status: Former Smoker    Packs/day: 1.00    Quit date: 01/20/2016  . Smokeless tobacco: Never Used  . Alcohol use No    Allergies: No Known Allergies  Prescriptions Prior to Admission  Medication Sig Dispense Refill Last Dose  . bisacodyl (DULCOLAX) 10 MG suppository Place 1 suppository (10 mg total) rectally as needed for moderate constipation. 12 suppository 0 More than a  month at Unknown time  . cephALEXin (KEFLEX) 500 MG capsule Take 1 capsule (500 mg total) by mouth 3 (three) times daily. 21 capsule 0     Review of Systems  Gastrointestinal: Positive for abdominal pain.  Genitourinary: Negative for vaginal bleeding.   Physical Exam   Blood pressure 117/59, pulse 88, temperature 98.7 F (37.1 C), temperature source Oral, resp. rate 16, weight 75.6 kg (166 lb 12 oz), last menstrual period 06/07/2016, SpO2 100 %, unknown if currently breastfeeding.  Physical Exam  Nursing note and vitals reviewed. Constitutional: She is oriented to person, place, and time. She appears well-developed and well-nourished. No distress.  HENT:  Head: Normocephalic.  Neck: Normal range of motion.  Cardiovascular: Normal rate.   Respiratory: Effort normal.  Musculoskeletal: Normal range of motion.  Neurological: She is alert and oriented to person, place, and time.  Psychiatric: She has a normal mood and affect.   Results for orders placed or performed during the hospital encounter of 07/14/16 (from the past 24 hour(s))  hCG, quantitative, pregnancy     Status: Abnormal   Collection Time: 07/14/16  2:55 PM  Result Value Ref Range   hCG, Beta Chain,  Quant, S 8,082 (H) <5 mIU/mL   US Ob Transvaginal  Result Date: 07/14/2016 CLINICAL DATA:  26 year old pregnant female with left pelvic pain. Beta HCG of the 8,082. EXAM: TRANSVAGINAL OB ULTRASOUND TECHNIQUE: Transvaginal ultrasound was performed for complete evaluation of the gestation as well as the maternal uterus, adnexal regions, and pelvic cul-de-sac. COMPARISON:  None. FINDINGS: Intrauterine gestational sac: Single Yolk sac:  Visualized. Embryo:  Not Visualized. Cardiac Activity: Not Visualized. MSD: 7.9  mm   5 w   3  d         [place] EDC: 03/13/2017 Subchorionic hemorrhage:  None visualized. Maternal uterus/adnexae: The ovaries are normal. No free fluid or adnexal mass. IMPRESSION: Single intrauterine gestational sac  containing a yolk sac. Embryo not identified at this time. Estimated gestational age of [redacted] weeks 3 days by mean sac diameter. No evidence of acute abnormality or subchorionic hemorrhage. Electronically Signed   By: Harmon Pier M.D.   On: 07/14/2016 16:38   MAU Course  Procedures  MDM Labs and [place] ordered and reviewed. Good rise in quant. [place] today normal but no FP seen likely d/t early gestation. Will rpt in 2 weeks for viability. Stable for discharge home.  Assessment and Plan   1. Pregnancy with inconclusive fetal viability, single or unspecified fetus   2. Pregnancy, location unknown   3. Abdominal pain affecting pregnancy    Discharge home Follow up in 2 week in [facility] for [place] SAB/bleeding precautions  Allergies as of 07/14/2016   No Known Allergies     Medication List    STOP taking these medications   bisacodyl 10 MG suppository Commonly known as:  DULCOLAX   cephALEXin 500 MG capsule Commonly known as:  Jamal Maes, CNM 07/14/2016, 4:12 PM

## 2016-07-17 ENCOUNTER — Telehealth: Payer: Self-pay | Admitting: *Deleted

## 2016-07-17 NOTE — Telephone Encounter (Signed)
Pt left message stating that she was seen @ ED and needs US scheduled. Per chart review, pt had US on 3/11 showing IUGS and yolk sac but no fetal pole. Recommendation for repeat US in 2 weeks and order has been placed. I called pt and informed her US scheduled on 3/26 @ 1300 - arrive @ 1245.  She will receive results in our office following the US.  Pt agreed and voiced understanding.

## 2016-07-29 ENCOUNTER — Ambulatory Visit (HOSPITAL_COMMUNITY)
Admission: RE | Admit: 2016-07-29 | Discharge: 2016-07-29 | Disposition: A | Discharge status: Home / Self Care | Payer: Medicaid Other | Source: Ambulatory Visit | Attending: Certified Nurse Midwife | Admitting: Certified Nurse Midwife

## 2016-07-29 ENCOUNTER — Ambulatory Visit: Payer: Medicaid Other | Admitting: *Deleted

## 2016-07-29 DIAGNOSIS — Z3A01 Less than 8 weeks gestation of pregnancy: Secondary | ICD-10-CM | POA: Diagnosis not present

## 2016-07-29 DIAGNOSIS — O210 Mild hyperemesis gravidarum: Secondary | ICD-10-CM

## 2016-07-29 DIAGNOSIS — O26899 Other specified pregnancy related conditions, unspecified trimester: Secondary | ICD-10-CM

## 2016-07-29 DIAGNOSIS — O3680X0 Pregnancy with inconclusive fetal viability, not applicable or unspecified: Secondary | ICD-10-CM

## 2016-07-29 DIAGNOSIS — Z3689 Encounter for other specified antenatal screening: Secondary | ICD-10-CM | POA: Diagnosis present

## 2016-07-29 DIAGNOSIS — R109 Unspecified abdominal pain: Secondary | ICD-10-CM

## 2016-07-29 MED ORDER — PROMETHAZINE HCL 12.5 MG PO TABS: 12.5000 mg | ORAL_TABLET | Freq: Four times a day (QID) | ORAL | 0 refills | Status: DC | PRN

## 2016-07-29 NOTE — Progress Notes (Signed)
Here for US results- which were reviewed with her.   She would like to receive care here and has history of htn in pregnancy. She is 7 wk3d pregnant today. Sent to registrar to check out. Reviewed meds with her. She asked for something for nausea- order obtained from discussion with Dr. Debroah LoopArnold. Cautioned patient may make her very sleepy- take with caution.

## 2016-08-19 ENCOUNTER — Ambulatory Visit: Payer: Self-pay

## 2016-08-19 ENCOUNTER — Ambulatory Visit (HOSPITAL_COMMUNITY)
Admission: RE | Admit: 2016-08-19 | Discharge: 2016-08-19 | Disposition: A | Discharge status: Home / Self Care | Payer: Medicaid Other | Source: Ambulatory Visit | Attending: Obstetrics and Gynecology | Admitting: Obstetrics and Gynecology

## 2016-08-19 ENCOUNTER — Other Ambulatory Visit (HOSPITAL_COMMUNITY)
Admission: RE | Admit: 2016-08-19 | Discharge: 2016-08-19 | Disposition: A | Discharge status: Home / Self Care | Payer: Medicaid Other | Source: Ambulatory Visit | Attending: Obstetrics and Gynecology | Admitting: Obstetrics and Gynecology

## 2016-08-19 ENCOUNTER — Ambulatory Visit (INDEPENDENT_AMBULATORY_CARE_PROVIDER_SITE_OTHER): Payer: Medicaid Other | Admitting: Obstetrics and Gynecology

## 2016-08-19 ENCOUNTER — Encounter: Payer: Self-pay | Admitting: Family Medicine

## 2016-08-19 ENCOUNTER — Encounter: Payer: Self-pay | Admitting: Obstetrics and Gynecology

## 2016-08-19 VITALS — BP 139/64 | HR 63 | Wt 171.5 lb

## 2016-08-19 DIAGNOSIS — O3680X0 Pregnancy with inconclusive fetal viability, not applicable or unspecified: Secondary | ICD-10-CM | POA: Insufficient documentation

## 2016-08-19 DIAGNOSIS — Z124 Encounter for screening for malignant neoplasm of cervix: Secondary | ICD-10-CM | POA: Diagnosis not present

## 2016-08-19 DIAGNOSIS — Z113 Encounter for screening for infections with a predominantly sexual mode of transmission: Secondary | ICD-10-CM

## 2016-08-19 DIAGNOSIS — O09291 Supervision of pregnancy with other poor reproductive or obstetric history, first trimester: Secondary | ICD-10-CM | POA: Diagnosis present

## 2016-08-19 DIAGNOSIS — Z3481 Encounter for supervision of other normal pregnancy, first trimester: Secondary | ICD-10-CM

## 2016-08-19 DIAGNOSIS — O09299 Supervision of pregnancy with other poor reproductive or obstetric history, unspecified trimester: Secondary | ICD-10-CM

## 2016-08-19 DIAGNOSIS — Z3A10 10 weeks gestation of pregnancy: Secondary | ICD-10-CM | POA: Diagnosis not present

## 2016-08-19 HISTORY — DX: Supervision of pregnancy with other poor reproductive or obstetric history, unspecified trimester: O09.299

## 2016-08-19 LAB — POCT URINALYSIS DIP (DEVICE)
Bilirubin Urine: NEGATIVE
Glucose, UA: NEGATIVE mg/dL
Hgb urine dipstick: NEGATIVE
Ketones, ur: NEGATIVE mg/dL
Nitrite: NEGATIVE
Protein, ur: NEGATIVE mg/dL
Specific Gravity, Urine: 1.025 (ref 1.005–1.030)
Urobilinogen, UA: 0.2 mg/dL (ref 0.0–1.0)
pH: 6 (ref 5.0–8.0)

## 2016-08-19 MED ORDER — PREPLUS 27-1 MG PO TABS: 1.0000 | ORAL_TABLET | Freq: Every day | ORAL | 13 refills | Status: DC

## 2016-08-19 MED ORDER — ASPIRIN EC 81 MG PO TBEC: 81.0000 mg | DELAYED_RELEASE_TABLET | Freq: Every day | ORAL | 2 refills | Status: DC

## 2016-08-19 NOTE — Addendum Note (Signed)
Addended by: Jill Side on: 08/19/2016 03:15 PM   Modules accepted: Orders

## 2016-08-19 NOTE — Progress Notes (Signed)
Initial prenatal info packet given Considering flu vaccine Schedule first trimester screen Initial prenatal labs/pap needed

## 2016-08-19 NOTE — Addendum Note (Signed)
Addended by: Jill Side on: 08/19/2016 03:16 PM   Modules accepted: Orders

## 2016-08-19 NOTE — Progress Notes (Signed)
Subjective:  Penny Becker is a 26 y.o. [id number] at 46w3dbeing seen today for her first OB visit. Some problems with constipation and N/V. She has a H/O PEC and IOL at 37 wks d/t PEC.  She is currently monitored for the following issues for this high-risk pregnancy and has Hx of preeclampsia, prior pregnancy, currently pregnant on her problem list.  Patient reports as noted.   . Vag. Bleeding: None.   . Denies leaking of fluid.   The following portions of the patient's history were reviewed and updated as appropriate: allergies, current medications, past family history, past medical history, past social history, past surgical history and problem list. Problem list updated.  Objective:   Vitals:   08/19/16 1308  BP: 139/64  Pulse: 63  Weight: 171 lb 8 oz (77.8 kg)    Fetal Status:           General:  Alert, oriented and cooperative. Patient is in no acute distress.  Skin: Skin is warm and dry. No rash noted.   Cardiovascular: Normal heart rate noted  Respiratory: Normal respiratory effort, no problems with respiration noted  Abdomen: Soft, gravid, appropriate for gestational age. Pain/Pressure: Present     Pelvic:  Cervical exam performed        Extremities: Normal range of motion.  Edema: None  Mental Status: Normal mood and affect. Normal behavior. Normal judgment and thought content.  Breast sym supple no nipple d/c no adenopathy Urinalysis:      Assessment and Plan:  Pregnancy: [id number] 151w3d1. Supervision of normal intrauterine pregnancy in multigravida in first trimester Prenatal care and labs reviewed with pt. - docusate sodium (COLACE) 100 MG capsule; Take 300 mg by mouth daily. - magnesium hydroxide (MILK OF MAGNESIA) 400 MG/5ML suspension; Take 30 mLs by mouth daily as needed for mild constipation or moderate constipation. - POCT urinalysis dip (device) - [place] Fetal Nuchal Translucency; Future - Obstetric Panel, Including HIV - Cytology - PAP - Cystic Fibrosis  Mutation 97 - Culture, OB Urine - Prenatal Vit-Fe Fumarate-FA (PREPLUS) 27-1 MG TABS; Take 1 tablet by mouth daily.  Dispense: 30 tablet; Refill: 13 - US[place]B Transvaginal; Future  2. Hx of preeclampsia, prior pregnancy, currently pregnant  - Comp Met (CMET) - Protein / creatinine ratio, urine - aspirin EC 81 MG tablet; Take 1 tablet (81 mg total) by mouth daily. Take after 12 weeks for prevention of preeclampsia later in pregnancy  Dispense: 300 tablet; Refill: 2  3. Encounter to determine fetal viability of pregnancy, single or unspecified fetus  - US[place]B Limited - [place]B Transvaginal; Future  Preterm labor symptoms and general obstetric precautions including but not limited to vaginal bleeding, contractions, leaking of fluid and fetal movement were reviewed in detail with the patient. Please refer to After Visit Summary for other counseling recommendations.  Return in about 4 weeks (around 09/16/2016) for OB visit.   MiChancy MilroyMD

## 2016-08-19 NOTE — Progress Notes (Addendum)
Pt informed that the ultrasound is considered a limited OB ultrasound and is not intended to be a complete ultrasound exam.  Patient also informed that the ultrasound is not being completed with the intent of assessing for fetal or placental anomalies or any pelvic abnormalities.  Explained that the purpose of today's ultrasound is to assess for viability.  Patient acknowledges the purpose of the exam and the limitations of the study.    Unable to visualize IUP.  Pt will need transvaginal US today in order to determine viability.  Dr. Alysia Penna informed.   1650  Pt returned to office after [place] and was informed of fetal viability confirmed and normal results.  Pt and significant other voiced understanding.

## 2016-08-19 NOTE — Patient Instructions (Signed)
First Trimester of Pregnancy The first trimester of pregnancy is from week 1 until the end of week 13 (months 1 through 3). A week after a sperm fertilizes an egg, the egg will implant on the wall of the uterus. This embryo will begin to develop into a baby. Genes from you and your partner will form the baby. The female genes will determine whether the baby will be a boy or a girl. At 6-8 weeks, the eyes and face will be formed, and the heartbeat can be seen on ultrasound. At the end of 12 weeks, all the baby's organs will be formed. Now that you are pregnant, you will want to do everything you can to have a healthy baby. Two of the most important things are to get good prenatal care and to follow your health care provider's instructions. Prenatal care is all the medical care you receive before the baby's birth. This care will help prevent, find, and treat any problems during the pregnancy and childbirth. Body changes during your first trimester Your body goes through many changes during pregnancy. The changes vary from woman to woman.  You may gain or lose a couple of pounds at first.  You may feel sick to your stomach (nauseous) and you may throw up (vomit). If the vomiting is uncontrollable, call your health care provider.  You may tire easily.  You may develop headaches that can be relieved by medicines. All medicines should be approved by your health care provider.  You may urinate more often. Painful urination may mean you have a bladder infection.  You may develop heartburn as a result of your pregnancy.  You may develop constipation because certain hormones are causing the muscles that push stool through your intestines to slow down.  You may develop hemorrhoids or swollen veins (varicose veins).  Your breasts may begin to grow larger and become tender. Your nipples may stick out more, and the tissue that surrounds them (areola) may become darker.  Your gums may bleed and may be  sensitive to brushing and flossing.  Dark spots or blotches (chloasma, mask of pregnancy) may develop on your face. This will likely fade after the baby is born.  Your menstrual periods will stop.  You may have a loss of appetite.  You may develop cravings for certain kinds of food.  You may have changes in your emotions from day to day, such as being excited to be pregnant or being concerned that something may go wrong with the pregnancy and baby.  You may have more vivid and strange dreams.  You may have changes in your hair. These can include thickening of your hair, rapid growth, and changes in texture. Some women also have hair loss during or after pregnancy, or hair that feels dry or thin. Your hair will most likely return to normal after your baby is born.  What to expect at prenatal visits During a routine prenatal visit:  You will be weighed to make sure you and the baby are growing normally.  Your blood pressure will be taken.  Your abdomen will be measured to track your baby's growth.  The fetal heartbeat will be listened to between weeks 10 and 14 of your pregnancy.  Test results from any previous visits will be discussed.  Your health care provider may ask you:  How you are feeling.  If you are feeling the baby move.  If you have had any abnormal symptoms, such as leaking fluid, bleeding, severe headaches,   or abdominal cramping.  If you are using any tobacco products, including cigarettes, chewing tobacco, and electronic cigarettes.  If you have any questions.  Other tests that may be performed during your first trimester include:  Blood tests to find your blood type and to check for the presence of any previous infections. The tests will also be used to check for low iron levels (anemia) and protein on red blood cells (Rh antibodies). Depending on your risk factors, or if you previously had diabetes during pregnancy, you may have tests to check for high blood  sugar that affects pregnant women (gestational diabetes).  Urine tests to check for infections, diabetes, or protein in the urine.  An ultrasound to confirm the proper growth and development of the baby.  Fetal screens for spinal cord problems (spina bifida) and Down syndrome.  HIV (human immunodeficiency virus) testing. Routine prenatal testing includes screening for HIV, unless you choose not to have this test.  You may need other tests to make sure you and the baby are doing well.  Follow these instructions at home: Medicines  Follow your health care provider's instructions regarding medicine use. Specific medicines may be either safe or unsafe to take during pregnancy.  Take a prenatal vitamin that contains at least 600 micrograms (mcg) of folic acid.  If you develop constipation, try taking a stool softener if your health care provider approves. Eating and drinking  Eat a balanced diet that includes fresh fruits and vegetables, whole grains, good sources of protein such as meat, eggs, or tofu, and low-fat dairy. Your health care provider will help you determine the amount of weight gain that is right for you.  Avoid raw meat and uncooked cheese. These carry germs that can cause birth defects in the baby.  Eating four or five small meals rather than three large meals a day may help relieve nausea and vomiting. If you start to feel nauseous, eating a few soda crackers can be helpful. Drinking liquids between meals, instead of during meals, also seems to help ease nausea and vomiting.  Limit foods that are high in fat and processed sugars, such as fried and sweet foods.  To prevent constipation: ? Eat foods that are high in fiber, such as fresh fruits and vegetables, whole grains, and beans. ? Drink enough fluid to keep your urine clear or pale yellow. Activity  Exercise only as directed by your health care provider. Most women can continue their usual exercise routine during  pregnancy. Try to exercise for 30 minutes at least 5 days a week. Exercising will help you: ? Control your weight. ? Stay in shape. ? Be prepared for labor and delivery.  Experiencing pain or cramping in the lower abdomen or lower back is a good sign that you should stop exercising. Check with your health care provider before continuing with normal exercises.  Try to avoid standing for long periods of time. Move your legs often if you must stand in one place for a long time.  Avoid heavy lifting.  Wear low-heeled shoes and practice good posture.  You may continue to have sex unless your health care provider tells you not to. Relieving pain and discomfort  Wear a good support bra to relieve breast tenderness.  Take warm sitz baths to soothe any pain or discomfort caused by hemorrhoids. Use hemorrhoid cream if your health care provider approves.  Rest with your legs elevated if you have leg cramps or low back pain.  If you develop   varicose veins in your legs, wear support hose. Elevate your feet for 15 minutes, 3-4 times a day. Limit salt in your diet. Prenatal care  Schedule your prenatal visits by the twelfth week of pregnancy. They are usually scheduled monthly at first, then more often in the last 2 months before delivery.  Write down your questions. Take them to your prenatal visits.  Keep all your prenatal visits as told by your health care provider. This is important. Safety  Wear your seat belt at all times when driving.  Make a list of emergency phone numbers, including numbers for family, friends, the hospital, and police and fire departments. General instructions  Ask your health care provider for a referral to a local prenatal education class. Begin classes no later than the beginning of month 6 of your pregnancy.  Ask for help if you have counseling or nutritional needs during pregnancy. Your health care provider can offer advice or refer you to specialists for help  with various needs.  Do not use hot tubs, steam rooms, or saunas.  Do not douche or use tampons or scented sanitary pads.  Do not cross your legs for long periods of time.  Avoid cat litter boxes and soil used by cats. These carry germs that can cause birth defects in the baby and possibly loss of the fetus by miscarriage or stillbirth.  Avoid all smoking, herbs, alcohol, and medicines not prescribed by your health care provider. Chemicals in these products affect the formation and growth of the baby.  Do not use any products that contain nicotine or tobacco, such as cigarettes and e-cigarettes. If you need help quitting, ask your health care provider. You may receive counseling support and other resources to help you quit.  Schedule a dentist appointment. At home, brush your teeth with a soft toothbrush and be gentle when you floss. Contact a health care provider if:  You have dizziness.  You have mild pelvic cramps, pelvic pressure, or nagging pain in the abdominal area.  You have persistent nausea, vomiting, or diarrhea.  You have a bad smelling vaginal discharge.  You have pain when you urinate.  You notice increased swelling in your face, hands, legs, or ankles.  You are exposed to fifth disease or chickenpox.  You are exposed to German measles (rubella) and have never had it. Get help right away if:  You have a fever.  You are leaking fluid from your vagina.  You have spotting or bleeding from your vagina.  You have severe abdominal cramping or pain.  You have rapid weight gain or loss.  You vomit blood or material that looks like coffee grounds.  You develop a severe headache.  You have shortness of breath.  You have any kind of trauma, such as from a fall or a car accident. Summary  The first trimester of pregnancy is from week 1 until the end of week 13 (months 1 through 3).  Your body goes through many changes during pregnancy. The changes vary from  woman to woman.  You will have routine prenatal visits. During those visits, your health care provider will examine you, discuss any test results you may have, and talk with you about how you are feeling. This information is not intended to replace advice given to you by your health care provider. Make sure you discuss any questions you have with your health care provider. Document Released: 04/16/2001 Document Revised: 04/03/2016 Document Reviewed: 04/03/2016 Elsevier Interactive Patient Education  2017 [employer]  

## 2016-08-20 LAB — CYTOLOGY - PAP
Chlamydia: NEGATIVE
Diagnosis: NEGATIVE
Neisseria Gonorrhea: NEGATIVE

## 2016-08-22 LAB — OBSTETRIC PANEL, INCLUDING HIV
Antibody Screen: NEGATIVE
Basophils Absolute: 0 10*3/uL (ref 0.0–0.2)
Basos: 0 %
EOS (ABSOLUTE): 0.1 10*3/uL (ref 0.0–0.4)
Eos: 1 %
HIV Screen 4th Generation wRfx: NONREACTIVE
Hematocrit: 34.4 % (ref 34.0–46.6)
Hemoglobin: 11.7 g/dL (ref 11.1–15.9)
Hepatitis B Surface Ag: NEGATIVE
Immature Grans (Abs): 0 10*3/uL (ref 0.0–0.1)
Immature Granulocytes: 0 %
Lymphocytes Absolute: 1.9 10*3/uL (ref 0.7–3.1)
Lymphs: 23 %
MCH: 30.8 pg (ref 26.6–33.0)
MCHC: 34 g/dL (ref 31.5–35.7)
MCV: 91 fL (ref 79–97)
Monocytes Absolute: 0.5 10*3/uL (ref 0.1–0.9)
Monocytes: 6 %
Neutrophils Absolute: 5.8 10*3/uL (ref 1.4–7.0)
Neutrophils: 70 %
Platelets: 255 10*3/uL (ref 150–379)
RBC: 3.8 x10E6/uL (ref 3.77–5.28)
RDW: 13 % (ref 12.3–15.4)
RPR Ser Ql: NONREACTIVE
Rh Factor: POSITIVE
Rubella Antibodies, IGG: 1.76 index (ref >0.99)
WBC: 8.2 10*3/uL (ref 3.4–10.8)

## 2016-08-22 LAB — COMPREHENSIVE METABOLIC PANEL
ALT: 6 IU/L (ref 0–32)
AST: 14 IU/L (ref 0–40)
Albumin/Globulin Ratio: 1.6 (ref 1.2–2.2)
Albumin: 4.1 g/dL (ref 3.5–5.5)
Alkaline Phosphatase: 47 IU/L (ref 39–117)
BUN/Creatinine Ratio: 18 (ref 9–23)
BUN: 11 mg/dL (ref 6–20)
Bilirubin Total: 0.3 mg/dL (ref 0.0–1.2)
CO2: 21 mmol/L (ref 18–29)
Calcium: 8.9 mg/dL (ref 8.7–10.2)
Chloride: 101 mmol/L (ref 96–106)
Creatinine, Ser: 0.62 mg/dL (ref 0.57–1.00)
GFR calc Af Amer: 143 mL/min/{1.73_m2} (ref >59)
GFR calc non Af Amer: 124 mL/min/{1.73_m2} (ref >59)
Globulin, Total: 2.5 g/dL (ref 1.5–4.5)
Glucose: 70 mg/dL (ref 65–99)
Potassium: 4 mmol/L (ref 3.5–5.2)
Sodium: 137 mmol/L (ref 134–144)
Total Protein: 6.6 g/dL (ref 6.0–8.5)

## 2016-08-22 LAB — URINE CULTURE, OB REFLEX

## 2016-08-22 LAB — PROTEIN / CREATININE RATIO, URINE
Creatinine, Urine: 118.5 mg/dL
Protein, Ur: 15.5 mg/dL
Protein/Creat Ratio: 131 mg/g creat (ref 0–200)

## 2016-08-22 LAB — CULTURE, OB URINE

## 2016-08-26 LAB — CYSTIC FIBROSIS MUTATION 97: Interpretation: NOT DETECTED

## 2016-09-04 ENCOUNTER — Other Ambulatory Visit: Payer: Self-pay | Admitting: Obstetrics and Gynecology

## 2016-09-04 ENCOUNTER — Ambulatory Visit (HOSPITAL_COMMUNITY)
Admission: RE | Admit: 2016-09-04 | Discharge: 2016-09-04 | Disposition: A | Discharge status: Home / Self Care | Payer: Medicaid Other | Source: Ambulatory Visit | Attending: Obstetrics and Gynecology | Admitting: Obstetrics and Gynecology

## 2016-09-04 ENCOUNTER — Encounter (HOSPITAL_COMMUNITY): Payer: Self-pay

## 2016-09-04 DIAGNOSIS — Z3A12 12 weeks gestation of pregnancy: Secondary | ICD-10-CM | POA: Insufficient documentation

## 2016-09-04 DIAGNOSIS — O09291 Supervision of pregnancy with other poor reproductive or obstetric history, first trimester: Secondary | ICD-10-CM | POA: Diagnosis present

## 2016-09-04 DIAGNOSIS — Z3682 Encounter for antenatal screening for nuchal translucency: Secondary | ICD-10-CM | POA: Diagnosis not present

## 2016-09-04 DIAGNOSIS — Z3481 Encounter for supervision of other normal pregnancy, first trimester: Secondary | ICD-10-CM

## 2016-09-08 ENCOUNTER — Encounter (HOSPITAL_COMMUNITY): Payer: Self-pay

## 2016-09-08 ENCOUNTER — Inpatient Hospital Stay (HOSPITAL_COMMUNITY)
Admission: AD | Admit: 2016-09-08 | Discharge: 2016-09-08 | Disposition: A | Discharge status: Home / Self Care | Payer: Medicaid Other | Source: Ambulatory Visit | Attending: Obstetrics and Gynecology | Admitting: Obstetrics and Gynecology

## 2016-09-08 DIAGNOSIS — O9989 Other specified diseases and conditions complicating pregnancy, childbirth and the puerperium: Secondary | ICD-10-CM

## 2016-09-08 DIAGNOSIS — L239 Allergic contact dermatitis, unspecified cause: Secondary | ICD-10-CM | POA: Diagnosis not present

## 2016-09-08 DIAGNOSIS — Z3A13 13 weeks gestation of pregnancy: Secondary | ICD-10-CM | POA: Diagnosis not present

## 2016-09-08 DIAGNOSIS — R21 Rash and other nonspecific skin eruption: Secondary | ICD-10-CM | POA: Diagnosis present

## 2016-09-08 DIAGNOSIS — K5900 Constipation, unspecified: Secondary | ICD-10-CM | POA: Insufficient documentation

## 2016-09-08 DIAGNOSIS — L259 Unspecified contact dermatitis, unspecified cause: Secondary | ICD-10-CM | POA: Insufficient documentation

## 2016-09-08 DIAGNOSIS — Z8249 Family history of ischemic heart disease and other diseases of the circulatory system: Secondary | ICD-10-CM | POA: Insufficient documentation

## 2016-09-08 DIAGNOSIS — O99711 Diseases of the skin and subcutaneous tissue complicating pregnancy, first trimester: Secondary | ICD-10-CM | POA: Diagnosis not present

## 2016-09-08 DIAGNOSIS — O99611 Diseases of the digestive system complicating pregnancy, first trimester: Secondary | ICD-10-CM | POA: Diagnosis not present

## 2016-09-08 DIAGNOSIS — Z87891 Personal history of nicotine dependence: Secondary | ICD-10-CM | POA: Diagnosis not present

## 2016-09-08 DIAGNOSIS — Z841 Family history of disorders of kidney and ureter: Secondary | ICD-10-CM | POA: Diagnosis not present

## 2016-09-08 NOTE — Discharge Instructions (Signed)
See your medical provider if the area in your mouth worsens by becoming an ulcer or becoming much more tender or open. Use a hypoallergenic body wash - Aveeno makes one. Keep your appointments in the office and call the office if your condition worsens. You can use over the counter hydrocortisone cream to apply to the areas on your skin if you want to help them heal.

## 2016-09-08 NOTE — MAU Note (Signed)
Pt presents with rash on body that started about 1 week ago and an ulcer on bottom lip that she noticed today. States that she tried on make up last week at a store and worried it could be from that. Pt denies pain, vaginal bleeding or discharge.

## 2016-09-08 NOTE — MAU Provider Note (Signed)
History     CSN: [id number]  Arrival date and time: 09/08/16 2016   First Provider Initiated Contact with Patient 09/08/16 2025      Chief Complaint  Patient presents with  . Rash   HPI Penny MeyerKelly Becker 26 y.o. [id number]  Comes to MAU thinking she has herpes.  She has several spots of rash on her body and a bump on the inside of her lower lip on the mucus membranes.  Her mother has had fever blisters throughout her life.  Client is very worried that she has herpes.  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  Has been on the computer today checking to see what might be causing her problems and came in to be checked.  OB History    Gravida Para Term Preterm AB Living   7 2 2  0 4 2   SAB TAB Ectopic Multiple Live Births   2 2 0 0 2      Past Medical History:  Diagnosis Date  . Abnormal Pap smear   . Anemia    1st pregnancy, tx'd with iron  . Chlamydia   . History of recurrent UTIs last one 2 months ago  . Prior pregnancy complicated by PIH, antepartum    during 1st pregnancy  . Subchorionic hemorrhage 10/22/10  . Vaginal Pap smear, abnormal     Past Surgical History:  Procedure Laterality Date  . DILATION AND CURETTAGE OF UTERUS    . INDUCED ABORTION     2010, 2011    Family History  Problem Relation Age of Onset  . Heart disease Maternal Grandmother   . Liver disease Maternal Grandmother   . Heart disease Maternal Grandfather   . Liver disease Maternal Grandfather   . Schizophrenia Paternal Grandmother   . Diabetes Paternal Grandmother   . Mental illness Paternal Grandmother   . Hypertension Father   . Kidney disease Daughter     horse shoe kidneys recurrent UTI  . Hypertension Mother   . Multiple sclerosis Mother   . Anesthesia problems Neg Hx     Social History  Substance Use Topics  . Smoking status: Former Smoker    Packs/day: 1.00    Quit date: 01/20/2016  . Smokeless tobacco: Never Used  . Alcohol  use No    Allergies: No Known Allergies  Prescriptions Prior to Admission  Medication Sig Dispense Refill Last Dose  . aspirin EC 81 MG tablet Take 1 tablet (81 mg total) by mouth daily. Take after 12 weeks for prevention of preeclampsia later in pregnancy (Patient not taking: Reported on 09/04/2016) 300 tablet 2 Not Taking  . docusate sodium (COLACE) 100 MG capsule Take 300 mg by mouth daily.   Not Taking  . magnesium hydroxide (MILK OF MAGNESIA) 400 MG/5ML suspension Take 30 mLs by mouth daily as needed for mild constipation or moderate constipation.   Not Taking  . Prenatal Vit-Fe Fumarate-FA (PREPLUS) 27-1 MG TABS Take 1 tablet by mouth daily. (Patient not taking: Reported on 09/04/2016) 30 tablet 13 Not Taking    Review of Systems  Constitutional: Negative for fever.  Gastrointestinal: Negative for nausea and vomiting.  Skin: Positive for rash.   Physical Exam   Blood pressure 136/72, pulse 86, temperature 97.9 F (36.6 C), temperature source Oral, resp. rate 18, height 5' 8.5" (1.74 m), weight 172 lb (78 kg), last menstrual period 06/07/2016, unknown if currently breastfeeding.  Physical Exam  Nursing  note and vitals reviewed. Constitutional: She is oriented to person, place, and time. She appears well-developed and well-nourished.  HENT:  Head: Normocephalic.  Eyes: EOM are normal.  Neck: Neck supple.  GI:  FHT heard with doppler.  Musculoskeletal: Normal range of motion.  Neurological: She is alert and oriented to person, place, and time.  Skin: Skin is warm and dry.  Several spots of mild rash at places client pointed out - on hands (asymmetrical), one spot on each thigh (different places - asymmetrical), one area on upper back.  None of these areas are characteristic of HSV. Additionally has one raised spot in inner aspect of lower lip - on mucous membrane - not a blister and is not an ulcer.  Is not open.  Did not unroof to test for HSV as the area is nontender.   Psychiatric: She has a normal mood and affect.    MAU Course  Procedures  MDM Client has areas of likely contact dermatitis.  She usually uses a fragrant body wash and actually has a new one.  Advised hypoallergenic soap like aveno for her.   Advised it is unlikely that the bump on her inner lower lip is HSV.  If it enlarges, becomes tender or becomes an ulcer, she may need it reevaluated.  Assessment and Plan  Contact dermatitis Constipation  Plan Continue to use miralax as has not had BM in 1.5 weeks.   Use hydrocortisone cream on areas of mild rash. Use a hypo allergenic soap with no fragrance for cleaning your skin. Be seen in the office if the inner aspect of your lower lip gets very sore or develops into an open ulcer. None of the areas you have today are characteristic of HSV. - no blisters, no open skin or mucous membranes. Client and her partner are relieved at the present time.  Penny Becker Penny Becker 09/08/2016, 8:52 PM

## 2016-09-09 ENCOUNTER — Other Ambulatory Visit: Payer: Self-pay

## 2016-09-13 ENCOUNTER — Telehealth: Payer: Self-pay | Admitting: *Deleted

## 2016-09-13 NOTE — Telephone Encounter (Signed)
Pt elft message yesterday stating that she has been sick for3 days - coughing, wheezing, feeling weak and shaking.  She is taking allergy medicine but has never had it this bad - not sure if something else is wrong.  Please call back.

## 2016-09-16 ENCOUNTER — Encounter: Payer: Medicaid Other | Admitting: Obstetrics and Gynecology

## 2016-09-17 NOTE — Telephone Encounter (Signed)
Called patient to check on her and apologized for not calling sooner. Patient stated that it was ok she just had a bad cold but she feels much better now. She inquired about the genetic screening that was done, I informed her the results are not in yet. Understanding voiced.

## 2016-09-24 ENCOUNTER — Ambulatory Visit (INDEPENDENT_AMBULATORY_CARE_PROVIDER_SITE_OTHER): Payer: Medicaid Other | Admitting: Obstetrics and Gynecology

## 2016-09-24 VITALS — BP 120/76 | HR 76 | Wt 169.9 lb

## 2016-09-24 DIAGNOSIS — Z3482 Encounter for supervision of other normal pregnancy, second trimester: Secondary | ICD-10-CM

## 2016-09-24 DIAGNOSIS — O0992 Supervision of high risk pregnancy, unspecified, second trimester: Secondary | ICD-10-CM

## 2016-09-24 DIAGNOSIS — Z3492 Encounter for supervision of normal pregnancy, unspecified, second trimester: Secondary | ICD-10-CM | POA: Diagnosis not present

## 2016-09-24 DIAGNOSIS — Z3490 Encounter for supervision of normal pregnancy, unspecified, unspecified trimester: Secondary | ICD-10-CM

## 2016-09-24 DIAGNOSIS — O09299 Supervision of pregnancy with other poor reproductive or obstetric history, unspecified trimester: Secondary | ICD-10-CM

## 2016-09-24 DIAGNOSIS — O09292 Supervision of pregnancy with other poor reproductive or obstetric history, second trimester: Secondary | ICD-10-CM

## 2016-09-24 MED ORDER — FOLIC ACID 1 MG PO TABS: 1.0000 mg | ORAL_TABLET | Freq: Every day | ORAL | 1 refills | Status: DC

## 2016-09-24 NOTE — Progress Notes (Signed)
Home Medicaid Form Completed  

## 2016-09-24 NOTE — Progress Notes (Signed)
Anatomy US scheduled for June 22nd @ 0845.  Pt notified.  OB US prior authorized and placed into pt's appt.

## 2016-09-24 NOTE — Patient Instructions (Signed)
Constipation, Adult Constipation is when a person:  Poops (has a bowel movement) fewer times in a week than normal.  Has a hard time pooping.  Has poop that is dry, hard, or bigger than normal. Follow these instructions at home: Eating and drinking    Eat foods that have a lot of fiber, such as:  Fresh fruits and vegetables.  Whole grains.  Beans.  Eat less of foods that are high in fat, low in fiber, or overly processed, such as:  [place] fries.  Hamburgers.  Cookies.  Candy.  Soda.  Drink enough fluid to keep your pee (urine) clear or pale yellow. General instructions   Exercise regularly or as told by your doctor.  Go to the restroom when you feel like you need to poop. Do not hold it in.  Take over-the-counter and prescription medicines only as told by your doctor. These include any fiber supplements.  Do pelvic floor retraining exercises, such as:  Doing deep breathing while relaxing your lower belly (abdomen).  Relaxing your pelvic floor while pooping.  Watch your condition for any changes.  Keep all follow-up visits as told by your doctor. This is important. Contact a doctor if:  You have pain that gets worse.  You have a fever.  You have not pooped for 4 days.  You throw up (vomit).  You are not hungry.  You lose weight.  You are bleeding from the anus.  You have thin, pencil-like poop (stool). Get help right away if:  You have a fever, and your symptoms suddenly get worse.  You leak poop or have blood in your poop.  Your belly feels hard or bigger than normal (is bloated).  You have very bad belly pain.  You feel dizzy or you faint. This information is not intended to replace advice given to you by your health care provider. Make sure you discuss any questions you have with your health care provider. Document Released: 10/09/2007 Document Revised: 11/10/2015 Document Reviewed: 10/11/2015 Elsevier Interactive Patient Education   2017 [employer]. How a Baby Grows During Pregnancy Pregnancy begins when a female's sperm enters a female's egg (fertilization). This happens in one of the tubes (fallopian tubes) that connect the ovaries to the womb (uterus). The fertilized egg is called an embryo until it reaches 10 weeks. From 10 weeks until birth, it is called a fetus. The fertilized egg moves down the fallopian tube to the uterus. Then it implants into the lining of the uterus and begins to grow. The developing fetus receives oxygen and nutrients through the pregnant woman's bloodstream and the tissues that grow (placenta) to support the fetus. The placenta is the life support system for the fetus. It provides nutrition and removes waste. Learning as much as you can about your pregnancy and how your baby is developing can help you enjoy the experience. It can also make you aware of when there might be a problem and when to ask questions. How long does a typical pregnancy last? A pregnancy usually lasts 280 days, or about 40 weeks. Pregnancy is divided into three trimesters:  First trimester: 0-13 weeks.  Second trimester: 14-27 weeks.  Third trimester: 28-40 weeks. The day when your baby is considered ready to be born (full term) is your estimated date of delivery. How does my baby develop month by month? First month  The fertilized egg attaches to the inside of the uterus.  Some cells will form the placenta. Others will form the fetus.  The arms, legs, brain, spinal cord, lungs, and heart begin to develop.  At the end of the first month, the heart begins to beat. Second month  The bones, inner ear, eyelids, hands, and feet form.  The genitals develop.  By the end of 8 weeks, all major organs are developing. Third month  All of the internal organs are forming.  Teeth develop below the gums.  Bones and muscles begin to grow. The spine can flex.  The skin is transparent.  Fingernails and toenails begin to  form.  Arms and legs continue to grow longer, and hands and feet develop.  The fetus is about 3 in (7.6 cm) long. Fourth month  The placenta is completely formed.  The external sex organs, neck, outer ear, eyebrows, eyelids, and fingernails are formed.  The fetus can hear, swallow, and move its arms and legs.  The kidneys begin to produce urine.  The skin is covered with a white waxy coating (vernix) and very fine hair (lanugo). Fifth month  The fetus moves around more and can be felt for the first time (quickening).  The fetus starts to sleep and wake up and may begin to suck its finger.  The nails grow to the end of the fingers.  The organ in the digestive system that makes bile (gallbladder) functions and helps to digest the nutrients.  If your baby is a girl, eggs are present in her ovaries. If your baby is a boy, testicles start to move down into his scrotum. Sixth month  The lungs are formed, but the fetus is not yet able to breathe.  The eyes open. The brain continues to develop.  Your baby has fingerprints and toe prints. Your baby's hair grows thicker.  At the end of the second trimester, the fetus is about 9 in (22.9 cm) long. Seventh month  The fetus kicks and stretches.  The eyes are developed enough to sense changes in light.  The hands can make a grasping motion.  The fetus responds to sound. Eighth month  All organs and body systems are fully developed and functioning.  Bones harden and taste buds develop. The fetus may hiccup.  Certain areas of the brain are still developing. The skull remains soft. Ninth month  The fetus gains about  lb (0.23 kg) each week.  The lungs are fully developed.  Patterns of sleep develop.  The fetus's head typically moves into a head-down position (vertex) in the uterus to prepare for birth. If the buttocks move into a vertex position instead, the baby is breech.  The fetus weighs 6-9 lbs (2.72-4.08 kg) and is  19-20 in (48.26-50.8 cm) long. What can I do to have a healthy pregnancy and help my baby develop?  Eating and Drinking  Eat a healthy diet.  Talk with your health care provider to make sure that you are getting the nutrients that you and your baby need.  Visit www.[employer].be to learn about creating a healthy diet.  Gain a healthy amount of weight during pregnancy as advised by your health care provider. This is usually 25-35 pounds. You may need to:  Gain more if you were underweight before getting pregnant or if you are pregnant with more than one baby.  Gain less if you were overweight or obese when you got pregnant. Medicines and Vitamins  Take prenatal vitamins as directed by your health care provider. These include vitamins such as folic acid, iron, calcium, and vitamin D. They are important for  healthy development.  Take medicines only as directed by your health care provider. Read labels and ask a pharmacist or your health care provider whether over-the-counter medicines, supplements, and prescription drugs are safe to take during pregnancy. Activities  Be physically active as advised by your health care provider. Ask your health care provider to recommend activities that are safe for you to do, such as walking or swimming.  Do not participate in strenuous or extreme sports. Lifestyle  Do not drink alcohol.  Do not use any tobacco products, including cigarettes, chewing tobacco, or electronic cigarettes. If you need help quitting, ask your health care provider.  Do not use illegal drugs. Safety  Avoid exposure to mercury, lead, or other heavy metals. Ask your health care provider about common sources of these heavy metals.  Avoid listeria infection during pregnancy. Follow these precautions:  Do not eat soft cheeses or deli meats.  Do not eat hot dogs unless they have been warmed up to the point of steaming, such as in the microwave oven.  Do not drink  unpasteurized milk.  Avoid toxoplasmosis infection during pregnancy. Follow these precautions:  Do not change your cat's litter box, if you have a cat. Ask someone else to do this for you.  Wear gardening gloves while working in the yard. General Instructions  Keep all follow-up visits as directed by your health care provider. This is important. This includes prenatal care and screening tests.  Manage any chronic health conditions. Work closely with your health care provider to keep conditions, such as diabetes, under control. How do I know if my baby is developing well? At each prenatal visit, your health care provider will do several different tests to check on your health and keep track of your baby's development. These include:  Fundal height.  Your health care provider will measure your growing belly from top to bottom using a tape measure.  Your health care provider will also feel your belly to determine your baby's position.  Heartbeat.  An ultrasound in the first trimester can confirm pregnancy and show a heartbeat, depending on how far along you are.  Your health care provider will check your baby's heart rate at every prenatal visit.  As you get closer to your delivery date, you may have regular fetal heart rate monitoring to make sure that your baby is not in distress.  Second trimester ultrasound.  This ultrasound checks your baby's development. It also indicates your baby's gender. What should I do if I have concerns about my baby's development? Always talk with your health care provider about any concerns that you may have. This information is not intended to replace advice given to you by your health care provider. Make sure you discuss any questions you have with your health care provider. Document Released: 10/09/2007 Document Revised: 09/28/2015 Document Reviewed: 09/29/2013 Elsevier Interactive Patient Education  2017 [employer]

## 2016-09-24 NOTE — Progress Notes (Signed)
   PRENATAL VISIT NOTE  Subjective:  Penny Becker is a 26 y.o. [id number] at 2962w4d being seen today for ongoing prenatal care.  She is currently monitored for the following issues for this high-risk pregnancy and has Encounter for supervision of high risk pregnancy in second trimester, antepartum and Hx of preeclampsia, prior pregnancy, currently pregnant on her problem list.  Patient reports no complaints and cosntipation - has not been taking her pre-natal because of it..  Contractions: Not present. Vag. Bleeding: None.  Movement: Present. Denies leaking of fluid.   The following portions of the patient's history were reviewed and updated as appropriate: allergies, current medications, past family history, past medical history, past social history, past surgical history and problem list. Problem list updated.  Objective:   Vitals:   09/24/16 1022  BP: 120/76  Pulse: 76  Weight: 169 lb 14.4 oz (77.1 kg)    Fetal Status: Fetal Heart Rate (bpm): 164   Movement: Present     General:  Alert, oriented and cooperative. Patient is in no acute distress.  Skin: Skin is warm and dry. No rash noted.   Cardiovascular: Normal heart rate noted  Respiratory: Normal respiratory effort, no problems with respiration noted  Abdomen: Soft, gravid, appropriate for gestational age. Pain/Pressure: Present     Pelvic:  Cervical exam deferred        Extremities: Normal range of motion.  Edema: None  Mental Status: Normal mood and affect. Normal behavior. Normal judgment and thought content.   Assessment and Plan:  Pregnancy: [id number] at 7062w4d  1. Encounter for supervision of low-risk pregnancy, antepartum - AFP, Serum, Open Spina Bifida - US MFM OB COMP + 14 WK; Future - folic acid (FOLVITE) 1 MG tablet; Take 1 tablet (1 mg total) by mouth daily.  Dispense: 30 tablet; Refill: 1 -first trimester screen reviewed and normal -continue to use stool softeners for constipation - prescribed folic acid.  2. Hx  of preeclampsia, prior pregnancy, currently pregnant Patient has not been taking here ASA Patient states she will pick it up today    Preterm labor symptoms and general obstetric precautions including but not limited to vaginal bleeding, contractions, leaking of fluid and fetal movement were reviewed in detail with the patient. Please refer to After Visit Summary for other counseling recommendations.  Return in about 4 weeks (around 10/22/2016).   Ernestina PennaNicholas Schenk, MD

## 2016-09-26 LAB — AFP, SERUM, OPEN SPINA BIFIDA
AFP MoM: 0.68
AFP Value: 19.3 ng/mL
Gest. Age on Collection Date: 15.6 weeks
Maternal Age At EDD: 27.8 yr
OSBR Risk 1 IN: 10000
Test Results:: NEGATIVE
Weight: 169 [lb_av]

## 2016-10-01 ENCOUNTER — Encounter (HOSPITAL_COMMUNITY): Payer: Self-pay | Admitting: *Deleted

## 2016-10-01 ENCOUNTER — Inpatient Hospital Stay (HOSPITAL_COMMUNITY)
Admission: AD | Admit: 2016-10-01 | Discharge: 2016-10-01 | Disposition: A | Discharge status: Home / Self Care | Payer: Medicaid Other | Source: Ambulatory Visit | Attending: Family Medicine | Admitting: Family Medicine

## 2016-10-01 DIAGNOSIS — N949 Unspecified condition associated with female genital organs and menstrual cycle: Secondary | ICD-10-CM

## 2016-10-01 DIAGNOSIS — Z833 Family history of diabetes mellitus: Secondary | ICD-10-CM | POA: Insufficient documentation

## 2016-10-01 DIAGNOSIS — Z87891 Personal history of nicotine dependence: Secondary | ICD-10-CM | POA: Diagnosis not present

## 2016-10-01 DIAGNOSIS — R102 Pelvic and perineal pain: Secondary | ICD-10-CM | POA: Diagnosis not present

## 2016-10-01 DIAGNOSIS — R109 Unspecified abdominal pain: Secondary | ICD-10-CM

## 2016-10-01 DIAGNOSIS — Z841 Family history of disorders of kidney and ureter: Secondary | ICD-10-CM | POA: Diagnosis not present

## 2016-10-01 DIAGNOSIS — Z3A16 16 weeks gestation of pregnancy: Secondary | ICD-10-CM | POA: Diagnosis present

## 2016-10-01 DIAGNOSIS — Z818 Family history of other mental and behavioral disorders: Secondary | ICD-10-CM | POA: Diagnosis not present

## 2016-10-01 DIAGNOSIS — Z7982 Long term (current) use of aspirin: Secondary | ICD-10-CM | POA: Diagnosis not present

## 2016-10-01 DIAGNOSIS — O26892 Other specified pregnancy related conditions, second trimester: Secondary | ICD-10-CM | POA: Diagnosis not present

## 2016-10-01 DIAGNOSIS — O9989 Other specified diseases and conditions complicating pregnancy, childbirth and the puerperium: Secondary | ICD-10-CM

## 2016-10-01 DIAGNOSIS — Z8249 Family history of ischemic heart disease and other diseases of the circulatory system: Secondary | ICD-10-CM | POA: Insufficient documentation

## 2016-10-01 LAB — URINALYSIS, ROUTINE W REFLEX MICROSCOPIC
Bilirubin Urine: NEGATIVE
Glucose, UA: NEGATIVE mg/dL
Hgb urine dipstick: NEGATIVE
Ketones, ur: NEGATIVE mg/dL
Nitrite: NEGATIVE
Protein, ur: NEGATIVE mg/dL
Specific Gravity, Urine: 1.006 (ref 1.005–1.030)
pH: 5 (ref 5.0–8.0)

## 2016-10-01 NOTE — MAU Provider Note (Signed)
History     CSN: [id number]  Arrival date and time: 10/01/16 4098   First Provider Initiated Contact with Patient 10/01/16 1943      Chief Complaint  Patient presents with  . Pelvic Pain   HPI [name] is a 26 y.o. [id number] at [redacted]w[redacted]d who presents to [facility]U today with complaint of pelvic pain for 2-3 days. 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 She feels the pain is worse with lifting and ambulation. She denies vaginal bleeding, change in discharge, UTI symptoms or fever. She has not tried anything for pain.    OB History    Gravida Para Term Preterm AB Living   7 2 2  0 4 2   SAB TAB Ectopic Multiple Live Births   2 2 0 0 2      Past Medical History:  Diagnosis Date  . Abnormal Pap smear   . Anemia    1st pregnancy, tx'd with iron  . Chlamydia   . History of recurrent UTIs last one 2 months ago  . Prior pregnancy complicated by PIH, antepartum    during 1st pregnancy  . Subchorionic hemorrhage 10/22/10  . Vaginal Pap smear, abnormal     Past Surgical History:  Procedure Laterality Date  . DILATION AND CURETTAGE OF UTERUS    . INDUCED ABORTION     2010, 2011    Family History  Problem Relation Age of Onset  . Heart disease Maternal Grandmother   . Liver disease Maternal Grandmother   . Heart disease Maternal Grandfather   . Liver disease Maternal Grandfather   . Schizophrenia Paternal Grandmother   . Diabetes Paternal Grandmother   . Mental illness Paternal Grandmother   . Hypertension Father   . Kidney disease Daughter        horse shoe kidneys recurrent UTI  . Hypertension Mother   . Multiple sclerosis Mother   . Anesthesia problems Neg Hx     Social History  Substance Use Topics  . Smoking status: Former Smoker    Packs/day: 1.00    Quit date: 01/20/2016  . Smokeless tobacco: Never Used  . Alcohol use No    Allergies: No Known Allergies  Prescriptions Prior to Admission  Medication Sig  Dispense Refill Last Dose  . aspirin EC 81 MG tablet Take 1 tablet (81 mg total) by mouth daily. Take after 12 weeks for prevention of preeclampsia later in pregnancy (Patient not taking: Reported on 09/04/2016) 300 tablet 2 Not Taking  . docusate sodium (COLACE) 100 MG capsule Take 300 mg by mouth daily.   Taking  . folic acid (FOLVITE) 1 MG tablet Take 1 tablet (1 mg total) by mouth daily. 30 tablet 1   . magnesium hydroxide (MILK OF MAGNESIA) 400 MG/5ML suspension Take 30 mLs by mouth daily as needed for mild constipation or moderate constipation.   Not Taking    Review of Systems  Constitutional: Negative for fever.  Gastrointestinal: Positive for abdominal pain. Negative for constipation, diarrhea, nausea and vomiting.  Genitourinary: Negative for dysuria, frequency, urgency, vaginal bleeding and vaginal discharge.   Physical Exam   Blood pressure (!) 136/56, pulse 73, temperature 98.3 F (36.8 C), temperature source Oral, resp. rate 18, height 5\' 8"  (1.727 m), weight 177 lb (80.3 kg), last menstrual period 06/07/2016, SpO2 100 %, unknown if currently breastfeeding.  Physical Exam  Nursing note and vitals reviewed. Constitutional: She is oriented  to person, place, and time. She appears well-developed and well-nourished. No distress.  HENT:  Head: Normocephalic and atraumatic.  Cardiovascular: Normal rate.   Respiratory: Effort normal.  GI: Soft. She exhibits no distension and no mass. There is no tenderness. There is no rebound and no guarding.  Neurological: She is alert and oriented to person, place, and time.  Skin: Skin is warm and dry. No erythema.  Psychiatric: She has a normal mood and affect.  Dilation: Closed Effacement (%): Thick Cervical Position: Posterior Exam by:: Vonzella NippleJulie Debbie Yearick, PA-C   Results for orders placed or performed during the hospital encounter of 10/01/16 (from the past 24 hour(s))  Urinalysis, Routine w reflex microscopic     Status: Abnormal    Collection Time: 10/01/16  7:35 PM  Result Value Ref Range   Color, Urine STRAW (A) YELLOW   APPearance CLEAR CLEAR   Specific Gravity, Urine 1.006 1.005 - 1.030   pH 5.0 5.0 - 8.0   Glucose, UA NEGATIVE NEGATIVE mg/dL   Hgb urine dipstick NEGATIVE NEGATIVE   Bilirubin Urine NEGATIVE NEGATIVE   Ketones, ur NEGATIVE NEGATIVE mg/dL   Protein, ur NEGATIVE NEGATIVE mg/dL   Nitrite NEGATIVE NEGATIVE   Leukocytes, UA TRACE (A) NEGATIVE   RBC / HPF 0-5 0 - 5 RBC/hpf   WBC, UA 0-5 0 - 5 WBC/hpf   Bacteria, UA RARE (A) NONE SEEN   Squamous Epithelial / LPF 0-5 (A) NONE SEEN   Mucous PRESENT     MAU Course  Procedures None  MDM FHR - 156 bpm with doppler UA today without evidence of infection Cervix is closed.  Assessment and Plan  A: SIUP at 1031w4d Round ligament pain   P: Discharge home Tylenol PRN for pain advised  Discussed use of abdominal binder and moderation of activity Patient declines need for work note today Second trimester precautions discussed Patient advised to follow-up with [facility] as scheduled for routine prenatal care or sooner PRN Patient may return to [facility]U as needed or if her condition were to change or worsen  Vonzella NippleJulie Kyrillos Adams, PA-C 10/01/2016, 7:54 PM

## 2016-10-01 NOTE — Discharge Instructions (Signed)
Round Ligament Pain The round ligament is a cord of muscle and tissue that helps to support the uterus. It can become a source of pain during pregnancy if it becomes stretched or twisted as the baby grows. The pain usually begins in the second trimester of pregnancy, and it can come and go until the baby is delivered. It is not a serious problem, and it does not cause harm to the baby. Round ligament pain is usually a short, sharp, and pinching pain, but it can also be a dull, lingering, and aching pain. The pain is felt in the lower side of the abdomen or in the groin. It usually starts deep in the groin and moves up to the outside of the hip area. Pain can occur with:  A sudden change in position.  Rolling over in bed.  Coughing or sneezing.  Physical activity. Follow these instructions at home: Watch your condition for any changes. Take these steps to help with your pain:  When the pain starts, relax. Then try:  Sitting down.  Flexing your knees up to your abdomen.  Lying on your side with one pillow under your abdomen and another pillow between your legs.  Sitting in a warm bath for 15-20 minutes or until the pain goes away.  Take over-the-counter and prescription medicines only as told by your health care provider.  Move slowly when you sit and stand.  Avoid long walks if they cause pain.  Stop or lessen your physical activities if they cause pain. Contact a health care provider if:  Your pain does not go away with treatment.  You feel pain in your back that you did not have before.  Your medicine is not helping. Get help right away if:  You develop a fever or chills.  You develop uterine contractions.  You develop vaginal bleeding.  You develop nausea or vomiting.  You develop diarrhea.  You have pain when you urinate. This information is not intended to replace advice given to you by your health care provider. Make sure you discuss any questions you have with  your health care provider. Document Released: 01/30/2008 Document Revised: 09/28/2015 Document Reviewed: 06/29/2014 Elsevier Interactive Patient Education  2017 [employer]  

## 2016-10-01 NOTE — MAU Note (Signed)
Pt here with pelvic pain for last two days; also constipated.

## 2016-10-23 ENCOUNTER — Encounter: Payer: Self-pay | Admitting: *Deleted

## 2016-10-25 ENCOUNTER — Ambulatory Visit (HOSPITAL_COMMUNITY)
Admission: RE | Admit: 2016-10-25 | Discharge: 2016-10-25 | Disposition: A | Discharge status: Home / Self Care | Payer: Medicaid Other | Source: Ambulatory Visit | Attending: Obstetrics and Gynecology | Admitting: Obstetrics and Gynecology

## 2016-10-25 ENCOUNTER — Ambulatory Visit (INDEPENDENT_AMBULATORY_CARE_PROVIDER_SITE_OTHER): Payer: Medicaid Other | Admitting: Obstetrics and Gynecology

## 2016-10-25 VITALS — BP 116/65 | HR 53 | Wt 177.5 lb

## 2016-10-25 DIAGNOSIS — O09292 Supervision of pregnancy with other poor reproductive or obstetric history, second trimester: Secondary | ICD-10-CM | POA: Insufficient documentation

## 2016-10-25 DIAGNOSIS — O09299 Supervision of pregnancy with other poor reproductive or obstetric history, unspecified trimester: Secondary | ICD-10-CM

## 2016-10-25 DIAGNOSIS — Z3689 Encounter for other specified antenatal screening: Secondary | ICD-10-CM | POA: Insufficient documentation

## 2016-10-25 DIAGNOSIS — Z3A20 20 weeks gestation of pregnancy: Secondary | ICD-10-CM | POA: Insufficient documentation

## 2016-10-25 DIAGNOSIS — Z3490 Encounter for supervision of normal pregnancy, unspecified, unspecified trimester: Secondary | ICD-10-CM

## 2016-10-25 DIAGNOSIS — O0992 Supervision of high risk pregnancy, unspecified, second trimester: Secondary | ICD-10-CM

## 2016-10-25 NOTE — Progress Notes (Signed)
Subjective:  Penny Becker is a 26 y.o. [id number] at 7654w0d being seen today for ongoing prenatal care.  She is currently monitored for the following issues for this high-risk pregnancy and has Encounter for supervision of high risk pregnancy in second trimester, antepartum and Hx of preeclampsia, prior pregnancy, currently pregnant on her problem list.  Patient reports constipation.  Contractions: Not present. Vag. Bleeding: None.  Movement: Present. Denies leaking of fluid.   The following portions of the patient's history were reviewed and updated as appropriate: allergies, current medications, past family history, past medical history, past social history, past surgical history and problem list. Problem list updated.  Objective:   Vitals:   10/25/16 1035  BP: 116/65  Pulse: (!) 53  Weight: 177 lb 8 oz (80.5 kg)    Fetal Status: Fetal Heart Rate (bpm): u/s   Movement: Present     General:  Alert, oriented and cooperative. Patient is in no acute distress.  Skin: Skin is warm and dry. No rash noted.   Cardiovascular: Normal heart rate noted  Respiratory: Normal respiratory effort, no problems with respiration noted  Abdomen: Soft, gravid, appropriate for gestational age. Pain/Pressure: Present     Pelvic:  Cervical exam deferred        Extremities: Normal range of motion.  Edema: None  Mental Status: Normal mood and affect. Normal behavior. Normal judgment and thought content.   Urinalysis:      Assessment and Plan:  Pregnancy: [id number] at 7554w0d  1. Hx of preeclampsia, prior pregnancy, currently pregnant BP Stable On BASA  2. Encounter for supervision of high risk pregnancy in second trimester, antepartum Stable Tx for constipation reviewed  Preterm labor symptoms and general obstetric precautions including but not limited to vaginal bleeding, contractions, leaking of fluid and fetal movement were reviewed in detail with the patient. Please refer to After Visit Summary for  other counseling recommendations.  Return in about 4 weeks (around 11/22/2016) for OB visit.   Hermina StaggersErvin, Keishawn Rajewski L, MD

## 2016-10-25 NOTE — Progress Notes (Signed)
Breastfeeding tip reviewed 

## 2016-10-25 NOTE — Patient Instructions (Signed)

## 2016-11-01 ENCOUNTER — Other Ambulatory Visit: Payer: Self-pay | Admitting: Obstetrics and Gynecology

## 2016-11-01 DIAGNOSIS — O09299 Supervision of pregnancy with other poor reproductive or obstetric history, unspecified trimester: Secondary | ICD-10-CM

## 2016-11-01 DIAGNOSIS — Z3A20 20 weeks gestation of pregnancy: Secondary | ICD-10-CM

## 2016-11-01 DIAGNOSIS — Z3490 Encounter for supervision of normal pregnancy, unspecified, unspecified trimester: Secondary | ICD-10-CM

## 2016-11-05 ENCOUNTER — Inpatient Hospital Stay (HOSPITAL_COMMUNITY)
Admission: AD | Admit: 2016-11-05 | Discharge: 2016-11-05 | Discharge status: Against Medical Advice | Payer: Medicaid Other | Source: Ambulatory Visit | Attending: Family Medicine | Admitting: Family Medicine

## 2016-11-05 DIAGNOSIS — R102 Pelvic and perineal pain: Secondary | ICD-10-CM | POA: Insufficient documentation

## 2016-11-05 LAB — URINALYSIS, ROUTINE W REFLEX MICROSCOPIC
Bilirubin Urine: NEGATIVE
Glucose, UA: NEGATIVE mg/dL
Hgb urine dipstick: NEGATIVE
Ketones, ur: NEGATIVE mg/dL
Nitrite: NEGATIVE
Protein, ur: NEGATIVE mg/dL
Specific Gravity, Urine: 1.004 — ABNORMAL LOW (ref 1.005–1.030)
pH: 5 (ref 5.0–8.0)

## 2016-11-05 NOTE — MAU Note (Signed)
1733 pt wanting to leave- needs to pick up kids

## 2016-11-05 NOTE — MAU Note (Signed)
Past 3 days have been feeling pelvic pressure, comes and goes.  Has also been having pains on her sides/upper mid abd. Just wanted to be sure everything is ok. No bleeding or leaking.

## 2016-11-25 ENCOUNTER — Ambulatory Visit (INDEPENDENT_AMBULATORY_CARE_PROVIDER_SITE_OTHER): Payer: Medicaid Other | Admitting: Obstetrics and Gynecology

## 2016-11-25 VITALS — BP 128/70 | HR 89 | Wt 181.7 lb

## 2016-11-25 DIAGNOSIS — O0992 Supervision of high risk pregnancy, unspecified, second trimester: Secondary | ICD-10-CM

## 2016-11-25 DIAGNOSIS — O09299 Supervision of pregnancy with other poor reproductive or obstetric history, unspecified trimester: Secondary | ICD-10-CM

## 2016-11-25 DIAGNOSIS — O09292 Supervision of pregnancy with other poor reproductive or obstetric history, second trimester: Secondary | ICD-10-CM

## 2016-11-25 MED ORDER — ASPIRIN EC 81 MG PO TBEC: 81.0000 mg | DELAYED_RELEASE_TABLET | Freq: Every day | ORAL | 2 refills | Status: DC

## 2016-11-25 NOTE — Progress Notes (Signed)
Subjective:  Penny Becker is a 26 y.o. [id number] at 6623w3d being seen today for ongoing prenatal care.  She is currently monitored for the following issues for this high-risk pregnancy and has Encounter for supervision of high risk pregnancy in second trimester, antepartum and Hx of preeclampsia, prior pregnancy, currently pregnant on her problem list.  Patient reports no complaints.  Contractions: Not present. Vag. Bleeding: None.  Movement: Present. Denies leaking of fluid.   The following portions of the patient's history were reviewed and updated as appropriate: allergies, current medications, past family history, past medical history, past social history, past surgical history and problem list. Problem list updated.  Objective:   Vitals:   11/25/16 1254  BP: 128/70  Pulse: 89  Weight: 181 lb 11.2 oz (82.4 kg)    Fetal Status: Fetal Heart Rate (bpm): 140   Movement: Present     General:  Alert, oriented and cooperative. Patient is in no acute distress.  Skin: Skin is warm and dry. No rash noted.   Cardiovascular: Normal heart rate noted  Respiratory: Normal respiratory effort, no problems with respiration noted  Abdomen: Soft, gravid, appropriate for gestational age. Pain/Pressure: Present     Pelvic:  Cervical exam deferred        Extremities: Normal range of motion.  Edema: None  Mental Status: Normal mood and affect. Normal behavior. Normal judgment and thought content.   Urinalysis:      Assessment and Plan:  Pregnancy: [id number] at 4223w3d  1. Encounter for supervision of high risk pregnancy in second trimester, antepartum Stable Glucola next visit  2. Hx of preeclampsia, prior pregnancy, currently pregnant Stable Importance of taking BASA reviewed with pt  Preterm labor symptoms and general obstetric precautions including but not limited to vaginal bleeding, contractions, leaking of fluid and fetal movement were reviewed in detail with the patient. Please refer to After  Visit Summary for other counseling recommendations.  Return in about 4 weeks (around 12/23/2016) for OB visit.   Hermina StaggersErvin, Thanh Pomerleau L, MD

## 2016-11-25 NOTE — Patient Instructions (Signed)

## 2016-12-24 ENCOUNTER — Other Ambulatory Visit (HOSPITAL_COMMUNITY)
Admission: RE | Admit: 2016-12-24 | Discharge: 2016-12-24 | Disposition: A | Discharge status: Home / Self Care | Payer: Medicaid Other | Source: Ambulatory Visit | Attending: Obstetrics and Gynecology | Admitting: Obstetrics and Gynecology

## 2016-12-24 ENCOUNTER — Ambulatory Visit (INDEPENDENT_AMBULATORY_CARE_PROVIDER_SITE_OTHER): Payer: Medicaid Other | Admitting: Obstetrics and Gynecology

## 2016-12-24 VITALS — BP 126/66 | HR 64 | Wt 182.2 lb

## 2016-12-24 DIAGNOSIS — Z113 Encounter for screening for infections with a predominantly sexual mode of transmission: Secondary | ICD-10-CM | POA: Diagnosis not present

## 2016-12-24 DIAGNOSIS — O26893 Other specified pregnancy related conditions, third trimester: Secondary | ICD-10-CM

## 2016-12-24 DIAGNOSIS — N898 Other specified noninflammatory disorders of vagina: Secondary | ICD-10-CM | POA: Insufficient documentation

## 2016-12-24 DIAGNOSIS — Z23 Encounter for immunization: Secondary | ICD-10-CM | POA: Diagnosis not present

## 2016-12-24 DIAGNOSIS — O0992 Supervision of high risk pregnancy, unspecified, second trimester: Secondary | ICD-10-CM | POA: Diagnosis not present

## 2016-12-24 DIAGNOSIS — Z331 Pregnant state, incidental: Secondary | ICD-10-CM | POA: Diagnosis not present

## 2016-12-24 DIAGNOSIS — O09299 Supervision of pregnancy with other poor reproductive or obstetric history, unspecified trimester: Secondary | ICD-10-CM

## 2016-12-24 MED ORDER — TETANUS-DIPHTH-ACELL PERTUSSIS 5-2.5-18.5 LF-MCG/0.5 IM SUSP
0.5000 mL | Freq: Once | INTRAMUSCULAR | Status: AC
  Administered 2016-12-24: 0.5 mL via INTRAMUSCULAR

## 2016-12-24 NOTE — Progress Notes (Signed)
Educated pt on Benefits of Breastfeeding for 6 months 28 wk packet given  tdap given

## 2016-12-24 NOTE — Addendum Note (Signed)
Addended by: Faythe Casa on: 12/24/2016 08:33 AM   Modules accepted: Orders

## 2016-12-24 NOTE — Progress Notes (Signed)
Subjective:  Penny Becker is a 26 y.o. [id number] at [redacted]w[redacted]d being seen today for ongoing prenatal care.  She is currently monitored for the following issues for this high-risk pregnancy and has Encounter for supervision of high risk pregnancy in second trimester, antepartum and Hx of preeclampsia, prior pregnancy, currently pregnant on her problem list.  Patient reports leaking of clear fluid for over a week that is off and on. No associated odor, no irritation. Only a small amount. Contractions: Not present. Vag. Bleeding: None.  Movement: Present.   The following portions of the patient's history were reviewed and updated as appropriate: allergies, current medications, past family history, past medical history, past social history, past surgical history and problem list. Problem list updated.  Objective:   Vitals:   12/24/16 0757  BP: 126/66  Pulse: 64  Weight: 182 lb 3.2 oz (82.6 kg)    Fetal Status: Fetal Heart Rate (bpm): 155 Fundal Height: 27 cm Movement: Present     General:  Alert, oriented and cooperative. Patient is in no acute distress.  Skin: Skin is warm and dry. No rash noted.   Cardiovascular: Normal heart rate noted  Respiratory: Normal respiratory effort, no problems with respiration noted  Abdomen: Soft, gravid, appropriate for gestational age. Pain/Pressure: Present     Pelvic: Vag. Bleeding: None    Sterile speculum exam: milky discharge Cervical exam deferred        Extremities: Normal range of motion.  Edema: None  Mental Status: Normal mood and affect. Normal behavior. Normal judgment and thought content.   Urinalysis:      Assessment and Plan:  Pregnancy: [id number] at [redacted]w[redacted]d  1. Encounter for supervision of high risk pregnancy in second trimester, antepartum Continue routine prenatal care. 28wk labs ordered today. Tdap given. - Glucose Tolerance, 2 Hours w/1 Hour - RPR - HIV antibody (with reflex) - CBC - Tdap (BOOSTRIX) injection 0.5 mL; Inject 0.5 mLs into  the muscle once.  2. Hx of preeclampsia, prior pregnancy, currently pregnant Continue ASA. No signs of preeclampsia. BPs stable.   3. Vaginal discharge during pregnancy in third trimester Wet prep collected. No concern at this time for ROM. Most likely physiologic discharge.   Preterm labor symptoms and general obstetric precautions including but not limited to vaginal bleeding, contractions, leaking of fluid and fetal movement were reviewed in detail with the patient. Please refer to After Visit Summary for other counseling recommendations.  Return in about 2 weeks (around 01/07/2017) for ob visit.   Caryl Ada, DO [facility], [facility] 12/24/2016, 8:28 AM

## 2016-12-24 NOTE — Patient Instructions (Signed)
Preeclampsia and Eclampsia °Preeclampsia is a serious condition that develops only during pregnancy. It is also called toxemia of pregnancy. This condition causes high blood pressure along with other symptoms, such as swelling and headaches. These symptoms may develop as the condition gets worse. Preeclampsia may occur at 20 weeks of pregnancy or later. °Diagnosing and treating preeclampsia early is very important. If not treated early, it can cause serious problems for you and your baby. One problem it can lead to is eclampsia, which is a condition that causes muscle jerking or shaking (convulsions or seizures) in the mother. Delivering your baby is the best treatment for preeclampsia or eclampsia. Preeclampsia and eclampsia symptoms usually go away after your baby is born. °What are the causes? °The cause of preeclampsia is not known. °What increases the risk? °The following risk factors make you more likely to develop preeclampsia: °· Being pregnant for the first time. °· Having had preeclampsia during a past pregnancy. °· Having a family history of preeclampsia. °· Having high blood pressure. °· Being pregnant with twins or triplets. °· Being 35 or older. °· Being African-American. °· Having kidney disease or diabetes. °· Having medical conditions such as lupus or blood diseases. °· Being very overweight (obese). ° °What are the signs or symptoms? °The earliest signs of preeclampsia are: °· High blood pressure. °· Increased protein in your urine. Your health care provider will check for this at every visit before you give birth (prenatal visit). ° °Other symptoms that may develop as the condition gets worse include: °· Severe headaches. °· Sudden weight gain. °· Swelling of the hands, face, legs, and feet. °· Nausea and vomiting. °· Vision problems, such as blurred or double vision. °· Numbness in the face, arms, legs, and feet. °· Urinating less than usual. °· Dizziness. °· Slurred speech. °· Abdominal pain,  especially upper abdominal pain. °· Convulsions or seizures. ° °Symptoms generally go away after giving birth. °How is this diagnosed? °There are no screening tests for preeclampsia. Your health care provider will ask you about symptoms and check for signs of preeclampsia during your prenatal visits. You may also have tests that include: °· Urine tests. °· Blood tests. °· Checking your blood pressure. °· Monitoring your baby’s heart rate. °· Ultrasound. ° °How is this treated? °You and your health care provider will determine the treatment approach that is best for you. Treatment may include: °· Having more frequent prenatal exams to check for signs of preeclampsia, if you have an increased risk for preeclampsia. °· Bed rest. °· Reducing how much salt (sodium) you eat. °· Medicine to lower your blood pressure. °· Staying in the hospital, if your condition is severe. There, treatment will focus on controlling your blood pressure and the amount of fluids in your body (fluid retention). °· You may need to take medicine (magnesium sulfate) to prevent seizures. This medicine may be given as an injection or through an IV tube. °· Delivering your baby early, if your condition gets worse. You may have your labor started with medicine (induced), or you may have a cesarean delivery. ° °Follow these instructions at home: °Eating and drinking ° °· Drink enough fluid to keep your urine clear or pale yellow. °· Eat a healthy diet that is low in sodium. Do not add salt to your food. Check nutrition labels to see how much sodium a food or beverage contains. °· Avoid caffeine. °Lifestyle °· Do not use any products that contain nicotine or tobacco, such as cigarettes   and e-cigarettes. If you need help quitting, ask your health care provider. °· Do not use alcohol or drugs. °· Avoid stress as much as possible. Rest and get plenty of sleep. °General instructions °· Take over-the-counter and prescription medicines only as told by your  health care provider. °· When lying down, lie on your side. This keeps pressure off of your baby. °· When sitting or lying down, raise (elevate) your feet. Try putting some pillows underneath your lower legs. °· Exercise regularly. Ask your health care provider what kinds of exercise are best for you. °· Keep all follow-up and prenatal visits as told by your health care provider. This is important. °How is this prevented? °To prevent preeclampsia or eclampsia from developing during another pregnancy: °· Get proper medical care during pregnancy. Your health care provider may be able to prevent preeclampsia or diagnose and treat it early. °· Your health care provider may have you take a low-dose aspirin or a calcium supplement during your next pregnancy. °· You may have tests of your blood pressure and kidney function after giving birth. °· Maintain a healthy weight. Ask your health care provider for help managing weight gain during pregnancy. °· Work with your health care provider to manage any long-term (chronic) health conditions you have, such as diabetes or kidney problems. ° °Contact a health care provider if: °· You gain more weight than expected. °· You have headaches. °· You have nausea or vomiting. °· You have abdominal pain. °· You feel dizzy or light-headed. °Get help right away if: °· You develop sudden or severe swelling anywhere in your body. This usually happens in the legs. °· You gain 5 lbs (2.3 kg) or more during one week. °· You have severe: °? Abdominal pain. °? Headaches. °? Dizziness. °? Vision problems. °? Confusion. °? Nausea or vomiting. °· You have a seizure. °· You have trouble moving any part of your body. °· You develop numbness in any part of your body. °· You have trouble speaking. °· You have any abnormal bleeding. °· You pass out. °This information is not intended to replace advice given to you by your health care provider. Make sure you discuss any questions you have with your health  care provider. °Document Released: 04/19/2000 Document Revised: 12/19/2015 Document Reviewed: 11/27/2015 °Elsevier Interactive Patient Education © 2018 [employer] ° °

## 2016-12-25 LAB — HIV ANTIBODY (ROUTINE TESTING W REFLEX): HIV Screen 4th Generation wRfx: NONREACTIVE

## 2016-12-25 LAB — CERVICOVAGINAL ANCILLARY ONLY
Bacterial vaginitis: NEGATIVE
Candida vaginitis: NEGATIVE
Trichomonas: NEGATIVE

## 2016-12-25 LAB — CBC
Hematocrit: 35.1 % (ref 34.0–46.6)
Hemoglobin: 11.4 g/dL (ref 11.1–15.9)
MCH: 30.8 pg (ref 26.6–33.0)
MCHC: 32.5 g/dL (ref 31.5–35.7)
MCV: 95 fL (ref 79–97)
Platelets: 205 10*3/uL (ref 150–379)
RBC: 3.7 x10E6/uL — ABNORMAL LOW (ref 3.77–5.28)
RDW: 12.9 % (ref 12.3–15.4)
WBC: 7.9 10*3/uL (ref 3.4–10.8)

## 2016-12-25 LAB — GLUCOSE TOLERANCE, 2 HOURS W/ 1HR
Glucose, 1 hour: 111 mg/dL (ref 65–179)
Glucose, 2 hour: 70 mg/dL (ref 65–152)
Glucose, Fasting: 82 mg/dL (ref 65–91)

## 2016-12-25 LAB — RPR: RPR Ser Ql: NONREACTIVE

## 2017-01-10 ENCOUNTER — Ambulatory Visit (INDEPENDENT_AMBULATORY_CARE_PROVIDER_SITE_OTHER): Payer: Medicaid Other | Admitting: Obstetrics & Gynecology

## 2017-01-10 VITALS — BP 127/67 | HR 81 | Wt 188.7 lb

## 2017-01-10 DIAGNOSIS — O0993 Supervision of high risk pregnancy, unspecified, third trimester: Secondary | ICD-10-CM

## 2017-01-10 DIAGNOSIS — O0992 Supervision of high risk pregnancy, unspecified, second trimester: Secondary | ICD-10-CM

## 2017-01-10 LAB — POCT URINALYSIS DIP (DEVICE)
Bilirubin Urine: NEGATIVE
Glucose, UA: 100 mg/dL — AB
Hgb urine dipstick: NEGATIVE
Leukocytes, UA: NEGATIVE
Nitrite: NEGATIVE
Protein, ur: NEGATIVE mg/dL
Specific Gravity, Urine: 1.025 (ref 1.005–1.030)
Urobilinogen, UA: 0.2 mg/dL (ref 0.0–1.0)
pH: 6 (ref 5.0–8.0)

## 2017-01-10 MED ORDER — FAMOTIDINE 40 MG PO TABS: 40.0000 mg | ORAL_TABLET | Freq: Every day | ORAL | 2 refills | Status: DC

## 2017-01-10 NOTE — Progress Notes (Signed)
   PRENATAL VISIT NOTE  Subjective:  Penny Becker is a 26 y.o. [id number] at 8932w0d being seen today for ongoing prenatal care.  She is currently monitored for the following issues for this high-risk pregnancy and has Encounter for supervision of high risk pregnancy in second trimester, antepartum and Hx of preeclampsia, prior pregnancy, currently pregnant on her problem list.   Patient reports increasing constipation. She reports hard round balls of stool every 3 days. She is currently taking 3 capfuls of Miralax and 300mg  of docusate daily. She has tried rectal suppositories and milk of magnesia without relief. She also reports an occasional sensation that food is sticking in her throat with associated reflux. She states that it happens inconsistently 3-4x over past 2 weeks with intermittent periods of reflux symptoms. She also complains of lower abdominal pain while standing during work, c/w round ligament pain.    Contractions: Irregular. Vag. Bleeding: None.  Movement: Present. Denies leaking of fluid. She denies headaches, blurry vision, RUQ pain, chest pain, SOB, urinary sx.    The following portions of the patient's history were reviewed and updated as appropriate: allergies, current medications, past family history, past medical history, past social history, past surgical history and problem list. Problem list updated.  Objective:   Vitals:   01/10/17 0810  BP: 127/67  Pulse: 81  Weight: 188 lb 11.2 oz (85.6 kg)    Fetal Status: Fetal Heart Rate (bpm): 139 Fundal Height: 30 cm Movement: Present     General:  Alert, oriented and cooperative. Patient is in no acute distress.  Skin: Skin is warm and dry. No rash noted.   Cardiovascular: Normal heart rate noted  Respiratory: Normal respiratory effort, no problems with respiration noted  Abdomen: Soft, gravid, appropriate for gestational age. Pain/Pressure: Present     Pelvic:  Cervical exam deferred        Extremities: Normal range  of motion.  Edema: Trace  Mental Status: Normal mood and affect. Normal behavior. Normal judgment and thought content.   Assessment and Plan:  Pregnancy: [id number] at 8232w0d  1. Encounter for supervision of high risk pregnancy in second trimester, antepartum - Routine care  2. Hx of preeclampsia, prior pregnancy, currently pregnant - No signs of preeclampsia, BPs stable - Continue ASA 81mg  daily  3. Vaginal discharge during pregnancy in third trimester - Unchanged from previous visit   4. Constipation during pregnancy - Continue miralax and colace - Advised to start fleet enemas  - Return precautions given   5. Acid reflux during pregnancy - Pepcid prescribed  6. Round ligament pain - Belly band  There are no diagnoses linked to this encounter. Preterm labor symptoms and general obstetric precautions including but not limited to vaginal bleeding, contractions, leaking of fluid and fetal movement were reviewed in detail with the patient. Please refer to After Visit Summary for other counseling recommendations.  Return in about 2 weeks (around 01/24/2017).  Dannette Barbararew Leonela Kivi, Medical Student

## 2017-01-10 NOTE — Patient Instructions (Signed)

## 2017-01-24 ENCOUNTER — Ambulatory Visit (INDEPENDENT_AMBULATORY_CARE_PROVIDER_SITE_OTHER): Payer: Medicaid Other | Admitting: Family Medicine

## 2017-01-24 VITALS — BP 137/72 | HR 97 | Wt 190.9 lb

## 2017-01-24 DIAGNOSIS — O09299 Supervision of pregnancy with other poor reproductive or obstetric history, unspecified trimester: Secondary | ICD-10-CM

## 2017-01-24 DIAGNOSIS — K5900 Constipation, unspecified: Secondary | ICD-10-CM

## 2017-01-24 DIAGNOSIS — O0992 Supervision of high risk pregnancy, unspecified, second trimester: Secondary | ICD-10-CM

## 2017-01-24 MED ORDER — BISACODYL 5 MG PO TBEC: 5.0000 mg | DELAYED_RELEASE_TABLET | Freq: Every day | ORAL | 0 refills | Status: DC | PRN

## 2017-01-24 NOTE — Progress Notes (Signed)
   PRENATAL VISIT NOTE  Subjective:  Penny Becker is a 26 y.o. [phone] at [redacted]w[redacted]d being seen today for ongoing prenatal care.  She is currently monitored for the following issues for this high-risk pregnancy and has Encounter for supervision of high risk pregnancy in second trimester, antepartum and Hx of preeclampsia, prior pregnancy, currently pregnant on her problem list.  Patient reports constipation. Also had a couple episodes of blurry vision that lasted for several moments, then went away. Had brief headache following, but resolved. Was unable to check BP during episodes..  Contractions: Irregular. Vag. Bleeding: None.  Movement: Present. Denies leaking of fluid.   The following portions of the patient's history were reviewed and updated as appropriate: allergies, current medications, past family history, past medical history, past social history, past surgical history and problem list. Problem list updated.  Objective:   Vitals:   01/24/17 0821  BP: 137/72  Pulse: 97  Weight: 190 lb 14.4 oz (86.6 kg)    Fetal Status: Fetal Heart Rate (bpm): 146 Fundal Height: 33 cm Movement: Present     General:  Alert, oriented and cooperative. Patient is in no acute distress.  Skin: Skin is warm and dry. No rash noted.   Cardiovascular: Normal heart rate noted  Respiratory: Normal respiratory effort, no problems with respiration noted  Abdomen: Soft, gravid, appropriate for gestational age.  Pain/Pressure: Present     Pelvic: Cervical exam deferred        Extremities: Normal range of motion.  Edema: Trace  Mental Status:  Normal mood and affect. Normal behavior. Normal judgment and thought content.   Assessment and Plan:  Pregnancy: [id number] at [redacted]w[redacted]d  1. Encounter for supervision of high risk pregnancy in second trimester, antepartum FHT and FH normal  2. Hx of preeclampsia, prior pregnancy, currently pregnant Continue ASA. Discussed that if she has further episodes, she should come to  [facility] to be evaluated.  3. Constipation, unspecified constipation type Bisacodyl.  Preterm labor symptoms and general obstetric precautions including but not limited to vaginal bleeding, contractions, leaking of fluid and fetal movement were reviewed in detail with the patient. Please refer to After Visit Summary for other counseling recommendations.  Return in about 2 weeks (around 02/07/2017).   Levie Heritage, DO

## 2017-02-03 ENCOUNTER — Encounter: Payer: Self-pay | Admitting: Obstetrics and Gynecology

## 2017-02-03 ENCOUNTER — Observation Stay
Admission: EM | Admit: 2017-02-03 | Discharge: 2017-02-04 | Disposition: A | Discharge status: Home / Self Care | Payer: Medicaid Other | Attending: Obstetrics and Gynecology | Admitting: Obstetrics and Gynecology

## 2017-02-03 DIAGNOSIS — O4703 False labor before 37 completed weeks of gestation, third trimester: Principal | ICD-10-CM | POA: Insufficient documentation

## 2017-02-03 DIAGNOSIS — Z3A34 34 weeks gestation of pregnancy: Secondary | ICD-10-CM | POA: Diagnosis not present

## 2017-02-03 DIAGNOSIS — R109 Unspecified abdominal pain: Secondary | ICD-10-CM

## 2017-02-03 DIAGNOSIS — O26899 Other specified pregnancy related conditions, unspecified trimester: Secondary | ICD-10-CM | POA: Diagnosis present

## 2017-02-03 MED ORDER — ACETAMINOPHEN 325 MG PO TABS
650.0000 mg | ORAL_TABLET | ORAL | Status: DC | PRN
  Administered 2017-02-04: 650 mg via ORAL
  Filled 2017-02-03: qty 2

## 2017-02-03 NOTE — OB Triage Note (Signed)
Pt presents to L&D with c/o 5/10 LLQ pain, cramping. Pt also states she had elevated blood pressures at home with floaters in her vision earlier this evening. Pt denies vaginal bleeding, leaking of fluid, or decreased fetal movement. Toco and EFM applied and explained. Will monitor closely.

## 2017-02-04 DIAGNOSIS — O4703 False labor before 37 completed weeks of gestation, third trimester: Secondary | ICD-10-CM | POA: Diagnosis not present

## 2017-02-04 DIAGNOSIS — O26893 Other specified pregnancy related conditions, third trimester: Secondary | ICD-10-CM | POA: Diagnosis not present

## 2017-02-04 DIAGNOSIS — Z3A34 34 weeks gestation of pregnancy: Secondary | ICD-10-CM | POA: Diagnosis not present

## 2017-02-04 DIAGNOSIS — R1030 Lower abdominal pain, unspecified: Secondary | ICD-10-CM | POA: Diagnosis not present

## 2017-02-04 LAB — COMPREHENSIVE METABOLIC PANEL
ALT: 22 U/L (ref 14–54)
AST: 24 U/L (ref 15–41)
Albumin: 3 g/dL — ABNORMAL LOW (ref 3.5–5.0)
Alkaline Phosphatase: 105 U/L (ref 38–126)
Anion gap: 8 (ref 5–15)
BUN: 7 mg/dL (ref 6–20)
CO2: 23 mmol/L (ref 22–32)
Calcium: 8.7 mg/dL — ABNORMAL LOW (ref 8.9–10.3)
Chloride: 104 mmol/L (ref 101–111)
Creatinine, Ser: 0.6 mg/dL (ref 0.44–1.00)
GFR calc Af Amer: >60 mL/min (ref >60)
GFR calc non Af Amer: >60 mL/min (ref >60)
Glucose, Bld: 95 mg/dL (ref 65–99)
Potassium: 3.3 mmol/L — ABNORMAL LOW (ref 3.5–5.1)
Sodium: 135 mmol/L (ref 135–145)
Total Bilirubin: 0.4 mg/dL (ref 0.3–1.2)
Total Protein: 6.6 g/dL (ref 6.5–8.1)

## 2017-02-04 LAB — CBC
HCT: 31.6 % — ABNORMAL LOW (ref 35.0–47.0)
Hemoglobin: 11.1 g/dL — ABNORMAL LOW (ref 12.0–16.0)
MCH: 32.1 pg (ref 26.0–34.0)
MCHC: 35.2 g/dL (ref 32.0–36.0)
MCV: 91.2 fL (ref 80.0–100.0)
Platelets: 204 10*3/uL (ref 150–440)
RBC: 3.47 MIL/uL — ABNORMAL LOW (ref 3.80–5.20)
RDW: 12.5 % (ref 11.5–14.5)
WBC: 9.6 10*3/uL (ref 3.6–11.0)

## 2017-02-04 LAB — URINALYSIS, ROUTINE W REFLEX MICROSCOPIC
Bilirubin Urine: NEGATIVE
Glucose, UA: NEGATIVE mg/dL
Hgb urine dipstick: NEGATIVE
Ketones, ur: 5 mg/dL — AB
Leukocytes, UA: NEGATIVE
Nitrite: NEGATIVE
Protein, ur: 30 mg/dL — AB
Specific Gravity, Urine: 1.026 (ref 1.005–1.030)
pH: 5 (ref 5.0–8.0)

## 2017-02-04 LAB — PROTEIN / CREATININE RATIO, URINE
Creatinine, Urine: 227 mg/dL
Protein Creatinine Ratio: 0.07 mg/mg{Cre} (ref 0.00–0.15)
Total Protein, Urine: 16 mg/dL

## 2017-02-04 LAB — LACTATE DEHYDROGENASE: LDH: 111 U/L (ref 98–192)

## 2017-02-04 LAB — URIC ACID: Uric Acid, Serum: 4.3 mg/dL (ref 2.3–6.6)

## 2017-02-04 NOTE — Progress Notes (Signed)
Discharged instructions and AVS reviewed with patient. All questions answered. Pt ambulated to ER lobby with RN.
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
## 2017-02-04 NOTE — Discharge Summary (Signed)
See Final Progress Note 02/04/2017.  Marcelyn Bruins, CNM 02/04/2017  1:33 AM

## 2017-02-04 NOTE — Discharge Instructions (Signed)
Please call or return to [facility] with any of the following: Vaginal bleeding Leaking of fluid Decreased fetal movements Contractions every 3-54mins for an hour

## 2017-02-07 ENCOUNTER — Ambulatory Visit (INDEPENDENT_AMBULATORY_CARE_PROVIDER_SITE_OTHER): Payer: Medicaid Other | Admitting: Obstetrics and Gynecology

## 2017-02-07 VITALS — BP 146/67 | HR 83 | Wt 192.9 lb

## 2017-02-07 DIAGNOSIS — O0993 Supervision of high risk pregnancy, unspecified, third trimester: Secondary | ICD-10-CM

## 2017-02-07 DIAGNOSIS — O09299 Supervision of pregnancy with other poor reproductive or obstetric history, unspecified trimester: Secondary | ICD-10-CM

## 2017-02-07 DIAGNOSIS — O09293 Supervision of pregnancy with other poor reproductive or obstetric history, third trimester: Secondary | ICD-10-CM

## 2017-02-07 LAB — POCT URINALYSIS DIP (DEVICE)
Bilirubin Urine: NEGATIVE
Glucose, UA: NEGATIVE mg/dL
Ketones, ur: NEGATIVE mg/dL
Leukocytes, UA: NEGATIVE
Nitrite: NEGATIVE
Protein, ur: NEGATIVE mg/dL
Specific Gravity, Urine: >=1.03 (ref 1.005–1.030)
Urobilinogen, UA: 0.2 mg/dL (ref 0.0–1.0)
pH: 6 (ref 5.0–8.0)

## 2017-02-07 NOTE — Progress Notes (Signed)
Subjective:  Penny Becker is a 26 y.o. [id number] at 81w0dbeing seen today for ongoing prenatal care.  She is currently monitored for the following issues for this high-risk pregnancy and has Encounter for supervision of high risk pregnancy in second trimester, antepartum and Hx of preeclampsia, prior pregnancy, currently pregnant on her problem list.  Patient reports general discomforts of pregnancy. Occ eye "spots" and HA. Was seen at hospital earlier this week for ut ctx and BP. W/U negative. .  Contractions: Irregular. Vag. Bleeding: Scant.  Movement: Present. Denies leaking of fluid.   The following portions of the patient's history were reviewed and updated as appropriate: allergies, current medications, past family history, past medical history, past social history, past surgical history and problem list. Problem list updated.  Objective:   Vitals:   02/07/17 0826  BP: (!) 146/67  Pulse: 83  Weight: 192 lb 14.4 oz (87.5 kg)    Fetal Status: Fetal Heart Rate (bpm): 135   Movement: Present     General:  Alert, oriented and cooperative. Patient is in no acute distress.  Skin: Skin is warm and dry. No rash noted.   Cardiovascular: Normal heart rate noted  Respiratory: Normal respiratory effort, no problems with respiration noted  Abdomen: Soft, gravid, appropriate for gestational age. Pain/Pressure: Present     Pelvic:  Cervical exam deferred        Extremities: Normal range of motion.  Edema: Trace  Mental Status: Normal mood and affect. Normal behavior. Normal judgment and thought content.   Urinalysis:      Assessment and Plan:  Pregnancy: [id number] 364w0d1. Supervision of high risk pregnancy in third trimester Stable Declined Flu vaccine GBS and vaginal cultures next visit  2. Hx of preeclampsia, prior pregnancy, currently pregnant Continue with BASA. Labs as noted. S/Sx of PEC reviewed with pt, instructed to come to MAU if develops. - CBC - Comp Met (CMET) -  Protein / creatinine ratio, urine  Preterm labor symptoms and general obstetric precautions including but not limited to vaginal bleeding, contractions, leaking of fluid and fetal movement were reviewed in detail with the patient. Please refer to After Visit Summary for other counseling recommendations.  Return for OB visit.   ErChancy MilroyMD

## 2017-02-08 LAB — PROTEIN / CREATININE RATIO, URINE
Creatinine, Urine: 160.1 mg/dL
Protein, Ur: 20.9 mg/dL
Protein/Creat Ratio: 131 mg/g creat (ref 0–200)

## 2017-02-08 LAB — COMPREHENSIVE METABOLIC PANEL
ALT: 16 IU/L (ref 0–32)
AST: 16 IU/L (ref 0–40)
Albumin/Globulin Ratio: 1.4 (ref 1.2–2.2)
Albumin: 3.5 g/dL (ref 3.5–5.5)
Alkaline Phosphatase: 114 IU/L (ref 39–117)
BUN/Creatinine Ratio: 13 (ref 9–23)
BUN: 7 mg/dL (ref 6–20)
Bilirubin Total: <0.2 mg/dL (ref 0.0–1.2)
CO2: 20 mmol/L (ref 20–29)
Calcium: 8.5 mg/dL — ABNORMAL LOW (ref 8.7–10.2)
Chloride: 104 mmol/L (ref 96–106)
Creatinine, Ser: 0.53 mg/dL — ABNORMAL LOW (ref 0.57–1.00)
GFR calc Af Amer: 150 mL/min/{1.73_m2} (ref >59)
GFR calc non Af Amer: 131 mL/min/{1.73_m2} (ref >59)
Globulin, Total: 2.5 g/dL (ref 1.5–4.5)
Glucose: 76 mg/dL (ref 65–99)
Potassium: 3.8 mmol/L (ref 3.5–5.2)
Sodium: 138 mmol/L (ref 134–144)
Total Protein: 6 g/dL (ref 6.0–8.5)

## 2017-02-08 LAB — CBC
Hematocrit: 31.6 % — ABNORMAL LOW (ref 34.0–46.6)
Hemoglobin: 10.5 g/dL — ABNORMAL LOW (ref 11.1–15.9)
MCH: 30.4 pg (ref 26.6–33.0)
MCHC: 33.2 g/dL (ref 31.5–35.7)
MCV: 92 fL (ref 79–97)
Platelets: 172 10*3/uL (ref 150–379)
RBC: 3.45 x10E6/uL — ABNORMAL LOW (ref 3.77–5.28)
RDW: 12.6 % (ref 12.3–15.4)
WBC: 7.7 10*3/uL (ref 3.4–10.8)

## 2017-02-14 ENCOUNTER — Other Ambulatory Visit: Payer: Self-pay | Admitting: Obstetrics & Gynecology

## 2017-02-14 ENCOUNTER — Ambulatory Visit (HOSPITAL_COMMUNITY)
Admission: RE | Admit: 2017-02-14 | Discharge: 2017-02-14 | Disposition: A | Discharge status: Home / Self Care | Payer: Medicaid Other | Source: Ambulatory Visit | Attending: Obstetrics & Gynecology | Admitting: Obstetrics & Gynecology

## 2017-02-14 ENCOUNTER — Other Ambulatory Visit (HOSPITAL_COMMUNITY)
Admission: RE | Admit: 2017-02-14 | Discharge: 2017-02-14 | Disposition: A | Discharge status: Home / Self Care | Payer: Medicaid Other | Source: Ambulatory Visit | Attending: Obstetrics & Gynecology | Admitting: Obstetrics & Gynecology

## 2017-02-14 ENCOUNTER — Ambulatory Visit (INDEPENDENT_AMBULATORY_CARE_PROVIDER_SITE_OTHER): Payer: Medicaid Other | Admitting: Obstetrics & Gynecology

## 2017-02-14 VITALS — BP 137/73 | HR 92 | Wt 193.7 lb

## 2017-02-14 DIAGNOSIS — O10913 Unspecified pre-existing hypertension complicating pregnancy, third trimester: Secondary | ICD-10-CM | POA: Diagnosis not present

## 2017-02-14 DIAGNOSIS — O0990 Supervision of high risk pregnancy, unspecified, unspecified trimester: Secondary | ICD-10-CM | POA: Diagnosis not present

## 2017-02-14 DIAGNOSIS — O09299 Supervision of pregnancy with other poor reproductive or obstetric history, unspecified trimester: Secondary | ICD-10-CM

## 2017-02-14 DIAGNOSIS — Z3A36 36 weeks gestation of pregnancy: Secondary | ICD-10-CM | POA: Diagnosis not present

## 2017-02-14 DIAGNOSIS — O0993 Supervision of high risk pregnancy, unspecified, third trimester: Secondary | ICD-10-CM

## 2017-02-14 DIAGNOSIS — O10019 Pre-existing essential hypertension complicating pregnancy, unspecified trimester: Secondary | ICD-10-CM | POA: Insufficient documentation

## 2017-02-14 DIAGNOSIS — O09293 Supervision of pregnancy with other poor reproductive or obstetric history, third trimester: Secondary | ICD-10-CM

## 2017-02-14 DIAGNOSIS — Z113 Encounter for screening for infections with a predominantly sexual mode of transmission: Secondary | ICD-10-CM | POA: Diagnosis not present

## 2017-02-14 NOTE — Progress Notes (Signed)
States BP at home 145/90    PRENATAL VISIT NOTE  Subjective:  Penny Becker is a 26 y.o. [phone] at [redacted]w[redacted]d being seen today for ongoing prenatal care.  She is currently monitored for the following issues for this high-risk pregnancy and has Encounter for supervision of high risk pregnancy in second trimester, antepartum and Hx of preeclampsia, prior pregnancy, currently pregnant on her problem list.  Patient reports heartburn.  Contractions: Irregular. Vag. Bleeding: Scant.  Movement: Present. Denies leaking of fluid.   The following portions of the patient's history were reviewed and updated as appropriate: allergies, current medications, past family history, past medical history, past social history, past surgical history and problem list. Problem list updated.  Objective:   Vitals:   02/14/17 1040  BP: 137/73  Pulse: 92  Weight: 193 lb 11.2 oz (87.9 kg)    Fetal Status: Fetal Heart Rate (bpm): 133   Movement: Present     General:  Alert, oriented and cooperative. Patient is in no acute distress.  Skin: Skin is warm and dry. No rash noted.   Cardiovascular: Normal heart rate noted  Respiratory: Normal respiratory effort, no problems with respiration noted  Abdomen: Soft, gravid, appropriate for gestational age.  Pain/Pressure: Present     Pelvic: Cervical exam performed        Extremities: Normal range of motion.  Edema: Trace  Mental Status:  Normal mood and affect. Normal behavior. Normal judgment and thought content.   Assessment and Plan:  Pregnancy: A5W0981 at [redacted]w[redacted]d  1. Supervision of high risk pregnancy, antepartum Routine 36 weeks - Culture, beta strep (group b only) - GC/Chlamydia probe amp ([place])not at [facility]  2. Maternal chronic hypertension in third trimester Elevated BP at home - [place] MFM FETAL BPP WO NON STRESS; Future  3. Hx of preeclampsia, prior pregnancy, currently pregnant Start testing and RTC 3 days  Preterm labor symptoms and general obstetric  precautions including but not limited to vaginal bleeding, contractions, leaking of fluid and fetal movement were reviewed in detail with the patient. Please refer to After Visit Summary for other counseling recommendations.  Return in about 3 days (around 02/17/2017). IOL no later than 39 weeks Growth [place]  Scheryl Darter, MD

## 2017-02-14 NOTE — Patient Instructions (Signed)
Hypertension During Pregnancy Hypertension is also called high blood pressure. High blood pressure means that the force of your blood moving in your body is too strong. When you are pregnant, this condition should be watched carefully. It can cause problems for you and your baby. Follow these instructions at home: Eating and drinking  Drink enough fluid to keep your pee (urine) clear or pale yellow.  Eat healthy foods that are low in salt (sodium). ? Do not add salt to your food. ? Check labels on foods and drinks to see much salt is in them. Look on the label where you see "Sodium." Lifestyle  Do not use any products that contain nicotine or tobacco, such as cigarettes and e-cigarettes. If you need help quitting, ask your doctor.  Do not use alcohol.  Avoid caffeine.  Avoid stress. Rest and get plenty of sleep. General instructions  Take over-the-counter and prescription medicines only as told by your doctor.  While lying down, lie on your left side. This keeps pressure off your baby.  While sitting or lying down, raise (elevate) your feet. Try putting some pillows under your lower legs.  Exercise regularly. Ask your doctor what kinds of exercise are best for you.  Keep all prenatal and follow-up visits as told by your doctor. This is important. Contact a doctor if:  You have symptoms that your doctor told you to watch for, such as: ? Fever. ? Throwing up (vomiting). ? Headache. Get help right away if:  You have very bad pain in your belly (abdomen).  You are throwing up, and this does not get better with treatment.  You suddenly get swelling in your hands, ankles, or face.  You gain 4 lb (1.8 kg) or more in 1 week.  You get bleeding from your vagina.  You have blood in your pee.  You do not feel your baby moving as much as normal.  You have a change in vision.  You have muscle twitching or sudden tightening (spasms).  You have trouble breathing.  Your lips  or fingernails turn blue. This information is not intended to replace advice given to you by your health care provider. Make sure you discuss any questions you have with your health care provider. Document Released: 05/25/2010 Document Revised: 01/02/2016 Document Reviewed: 01/02/2016 Elsevier Interactive Patient Education  2017 Elsevier Inc.  

## 2017-02-16 ENCOUNTER — Inpatient Hospital Stay (HOSPITAL_COMMUNITY)
Admission: AD | Admit: 2017-02-16 | Discharge: 2017-02-16 | Disposition: A | Discharge status: Home / Self Care | Payer: Medicaid Other | Source: Ambulatory Visit | Attending: Obstetrics & Gynecology | Admitting: Obstetrics & Gynecology

## 2017-02-16 ENCOUNTER — Encounter (HOSPITAL_COMMUNITY): Payer: Self-pay

## 2017-02-16 DIAGNOSIS — R109 Unspecified abdominal pain: Secondary | ICD-10-CM | POA: Insufficient documentation

## 2017-02-16 DIAGNOSIS — Z87891 Personal history of nicotine dependence: Secondary | ICD-10-CM | POA: Insufficient documentation

## 2017-02-16 DIAGNOSIS — R030 Elevated blood-pressure reading, without diagnosis of hypertension: Secondary | ICD-10-CM | POA: Diagnosis not present

## 2017-02-16 DIAGNOSIS — O9989 Other specified diseases and conditions complicating pregnancy, childbirth and the puerperium: Secondary | ICD-10-CM | POA: Diagnosis not present

## 2017-02-16 DIAGNOSIS — Z3A36 36 weeks gestation of pregnancy: Secondary | ICD-10-CM | POA: Diagnosis not present

## 2017-02-16 DIAGNOSIS — O26893 Other specified pregnancy related conditions, third trimester: Secondary | ICD-10-CM | POA: Insufficient documentation

## 2017-02-16 DIAGNOSIS — O133 Gestational [pregnancy-induced] hypertension without significant proteinuria, third trimester: Secondary | ICD-10-CM

## 2017-02-16 DIAGNOSIS — O10919 Unspecified pre-existing hypertension complicating pregnancy, unspecified trimester: Secondary | ICD-10-CM

## 2017-02-16 DIAGNOSIS — Z7982 Long term (current) use of aspirin: Secondary | ICD-10-CM | POA: Insufficient documentation

## 2017-02-16 DIAGNOSIS — M549 Dorsalgia, unspecified: Secondary | ICD-10-CM | POA: Insufficient documentation

## 2017-02-16 DIAGNOSIS — O99891 Other specified diseases and conditions complicating pregnancy: Secondary | ICD-10-CM

## 2017-02-16 HISTORY — DX: Unspecified pre-existing hypertension complicating pregnancy, unspecified trimester: O10.919

## 2017-02-16 LAB — URINALYSIS, ROUTINE W REFLEX MICROSCOPIC
Bilirubin Urine: NEGATIVE
Glucose, UA: NEGATIVE mg/dL
Hgb urine dipstick: NEGATIVE
Ketones, ur: NEGATIVE mg/dL
Leukocytes, UA: NEGATIVE
Nitrite: NEGATIVE
Protein, ur: NEGATIVE mg/dL
Specific Gravity, Urine: 1.017 (ref 1.005–1.030)
pH: 6 (ref 5.0–8.0)

## 2017-02-16 NOTE — Discharge Instructions (Signed)
Back Pain in Pregnancy Back pain during pregnancy is common. Back pain may be caused by several factors that are related to changes during your pregnancy. Follow these instructions at home: Managing pain, stiffness, and swelling  If directed, apply ice for sudden (acute) back pain. ? Put ice in a plastic bag. ? Place a towel between your skin and the bag. ? Leave the ice on for 20 minutes, 2-3 times per day.  If directed, apply heat to the affected area before you exercise: ? Place a towel between your skin and the heat pack or heating pad. ? Leave the heat on for 20-30 minutes. ? Remove the heat if your skin turns bright red. This is especially important if you are unable to feel pain, heat, or cold. You may have a greater risk of getting burned. Activity  Exercise as told by your health care provider. Exercising is the best way to prevent or manage back pain.  Listen to your body when lifting. If lifting hurts, ask for help or bend your knees. This uses your leg muscles instead of your back muscles.  Squat down when picking up something from the floor. Do not bend over.  Only use bed rest as told by your health care provider. Bed rest should only be used for the most severe episodes of back pain. Standing, Sitting, and Lying Down  Do not stand in one place for long periods of time.  Use good posture when sitting. Make sure your head rests over your shoulders and is not hanging forward. Use a pillow on your lower back if necessary.  Try sleeping on your side, preferably the left side, with a pillow or two between your legs. If you are sore after a night's rest, your bed may be too soft. A firm mattress may provide more support for your back during pregnancy. General instructions  Do not wear high heels.  Eat a healthy diet. Try to gain weight within your health care provider's recommendations.  Use a maternity girdle, elastic sling, or back brace as told by your health care  provider.  Take over-the-counter and prescription medicines only as told by your health care provider.  Keep all follow-up visits as told by your health care provider. This is important. This includes any visits with any specialists, such as a physical therapist. Contact a health care provider if:  Your back pain interferes with your daily activities.  You have increasing pain in other parts of your body. Get help right away if:  You develop numbness, tingling, weakness, or problems with the use of your arms or legs.  You develop severe back pain that is not controlled with medicine.  You have a sudden change in bowel or bladder control.  You develop shortness of breath, dizziness, or you faint.  You develop nausea, vomiting, or sweating.  You have back pain that is a rhythmic, cramping pain similar to labor pains. Labor pain is usually 1-2 minutes apart, lasts for about 1 minute, and involves a bearing down feeling or pressure in your pelvis.  You have back pain and your water breaks or you have vaginal bleeding.  You have back pain or numbness that travels down your leg.  Your back pain developed after you fell.  You develop pain on one side of your back.  You see blood in your urine.  You develop skin blisters in the area of your back pain. This information is not intended to replace advice given to you   by your health care provider. Make sure you discuss any questions you have with your health care provider. Document Released: 07/31/2005 Document Revised: 09/28/2015 Document Reviewed: 01/04/2015 Elsevier Interactive Patient Education  2018 Elsevier Inc.  

## 2017-02-16 NOTE — MAU Provider Note (Signed)
Patient Abbee Cremeens is a 26 y.o. [phone] at [redacted]w[redacted]d here with complaints of back pain and abdominal pain that comes and goes.  She denies decreased fetal movements, vaginal bleeding or leaking of fluid.   Patient called the nurse line to see if she could take Vicodin or Tylenol#3 for pain; nurse line advised her to come in and be seen first.   She denies blurry vision, HA, epigastric pain or sudden swelling.  History     CSN: [id number]  Arrival date and time: 02/16/17 2114   None     Chief Complaint  Patient presents with  . Back Pain   Abdominal Pain  This is a new problem. The current episode started today. The onset quality is sudden. The problem occurs intermittently. The problem has been resolved. Pain location: starts in back and wraps around to the front. The pain is at a severity of 9/10. The pain is moderate. The quality of the pain is sharp. Associated symptoms include diarrhea. Pertinent negatives include no headaches, nausea or vomiting. Nothing aggravates the pain. The pain is relieved by nothing. She has tried nothing for the symptoms.    OB History    Gravida Para Term Preterm AB Living   0 4 2   SAB TAB Ectopic Multiple Live Births   2 2 0 0 2      Past Medical History:  Diagnosis Date  . Abnormal Pap smear   . Anemia    1st pregnancy, tx'd with iron  . Chlamydia   . History of recurrent UTIs last one 2 months ago  . Prior pregnancy complicated by PIH, antepartum    during 1st pregnancy  . Subchorionic hemorrhage 10/22/10  . Vaginal Pap smear, abnormal     Past Surgical History:  Procedure Laterality Date  . DILATION AND CURETTAGE OF UTERUS    . INDUCED ABORTION     2010, 2011    Family History  Problem Relation Age of Onset  . Heart disease Maternal Grandmother   . Liver disease Maternal Grandmother   . Heart disease Maternal Grandfather   . Liver disease Maternal Grandfather   . Schizophrenia Paternal Grandmother   . Diabetes  Paternal Grandmother   . Mental illness Paternal Grandmother   . Hypertension Father   . Kidney disease Daughter        horse shoe kidneys recurrent UTI  . Hypertension Mother   . Multiple sclerosis Mother   . Anesthesia problems Neg Hx     Social History  Substance Use Topics  . Smoking status: Former Smoker    Packs/day: 1.00    Quit date: 01/20/2016  . Smokeless tobacco: Never Used  . Alcohol use No    Allergies: No Known Allergies  Prescriptions Prior to Admission  Medication Sig Dispense Refill Last Dose  . aspirin EC 81 MG tablet Take 1 tablet (81 mg total) by mouth daily. Take after 12 weeks for prevention of preeclampsia later in pregnancy 300 tablet 2 02/16/2017 at Unknown time  . Prenatal Vit-Fe Fumarate-FA (PRENATAL MULTIVITAMIN) TABS tablet Take 1 tablet by mouth daily at 12 noon.   02/16/2017 at Unknown time  . bisacodyl (BISACODYL) 5 MG EC tablet Take 1 tablet (5 mg total) by mouth daily as needed for moderate constipation. (Patient not taking: Reported on 02/03/2017) 30 tablet 0 Not Taking  . docusate sodium (COLACE) 100 MG capsule Take 300 mg by mouth daily.   Not Taking  . famotidine (PEPCID) 40  MG tablet Take 1 tablet (40 mg total) by mouth daily. (Patient not taking: Reported on 01/24/2017) 30 tablet 2 Not Taking  . flintstones complete (FLINTSTONES) 60 MG chewable tablet Chew 2 tablets by mouth daily.   Taking  . magnesium hydroxide (MILK OF MAGNESIA) 400 MG/5ML suspension Take 30 mLs by mouth daily as needed for mild constipation or moderate constipation.   Not Taking    Review of Systems  Gastrointestinal: Positive for abdominal pain and diarrhea. Negative for nausea and vomiting.  Neurological: Negative for headaches.   Physical Exam   Blood pressure 133/75, pulse 80, temperature 98.2 F (36.8 C), last menstrual period 06/07/2016, unknown if currently breastfeeding.  Physical Exam  Constitutional: She is oriented to person, place, and time. She appears  well-developed and well-nourished.  HENT:  Head: Normocephalic.  Eyes: Pupils are equal, round, and reactive to light.  Neck: Normal range of motion.  Respiratory: Effort normal.  GI: Soft. She exhibits no distension and no mass. There is no tenderness. There is no rebound and no guarding.  Genitourinary:  Genitourinary Comments: NEFG, cervix is 1.5, posterior, 50%.   Musculoskeletal: Normal range of motion.  No CVA tenderness   Neurological: She is alert and oriented to person, place, and time.  Skin: Skin is warm and dry.  Psychiatric: She has a normal mood and affect.    MAU Course  Procedures  MDM -UA: normal, no signs of infection -NST: 140 bpm with mod var, present acel, negative decels (decelerations at the end of the strip due to maternal repositioning and moving monitor).  -Patient had elevated BP readings in MAU (142/80 upon arrival MAU room and then subsequently 133/75 one minute later. She had pressure of 141/77 while talking with me 30 min later.)  Per Dr. Debroah Loop, patient should be seen this week in [facility] for prenatal visit and BP check.  Patient should also have BPP in MFM.  Assessment and Plan   1. Back pain affecting pregnancy in third trimester     2. Reassured patient of normalcy of back pain and periodic runs of contractions as she gets later in her pregnancy.  3. Directed patient NOT to take Vicodin or Tylenol #3 but rather try tylenol alone, heating pad, warm bath and rest. If contractions become more frequent, she should return to the MAU for labor evaluation.  4. Return precautions given; informed patient of the fact that she might need to be induced this week for gestational hypertension.  5. Message sent to Admin staff to work patient in for prenatal visit this week.  6. Patient to keep [place] appt this week (she will call to schedule it on Tuesday, Oct 16).  -BPP w/o NST ordered.  -will draw pre- e labs at next prenatal visit.   Charlesetta Garibaldi Kaniesha Barile  CNM 02/16/2017, 10:13 PM

## 2017-02-16 NOTE — MAU Note (Addendum)
Having back pain in her mid back since lunchtime today that radiates and wraps around to her ribs.  The pain comes and goes, earlier was occurring 7 mins apart, now less often.  Feeling BH occasionally but states that is different from the pain she is feeling in her back.  Has not taken anything for the pain.  Also having lower back pain and diarrhea since yesterday.  No abnormal discharge or VB/LOF.  Reports good FM.

## 2017-02-17 ENCOUNTER — Other Ambulatory Visit: Payer: Self-pay | Admitting: Student

## 2017-02-17 ENCOUNTER — Other Ambulatory Visit: Payer: Medicaid Other

## 2017-02-17 DIAGNOSIS — O163 Unspecified maternal hypertension, third trimester: Secondary | ICD-10-CM

## 2017-02-17 LAB — GC/CHLAMYDIA PROBE AMP (~~LOC~~) NOT AT ARMC
Chlamydia: NEGATIVE
Neisseria Gonorrhea: NEGATIVE

## 2017-02-18 ENCOUNTER — Ambulatory Visit (INDEPENDENT_AMBULATORY_CARE_PROVIDER_SITE_OTHER): Payer: Medicaid Other | Admitting: Family Medicine

## 2017-02-18 ENCOUNTER — Ambulatory Visit: Payer: Medicaid Other

## 2017-02-18 ENCOUNTER — Ambulatory Visit (HOSPITAL_COMMUNITY): Payer: Medicaid Other

## 2017-02-18 ENCOUNTER — Ambulatory Visit: Payer: Self-pay

## 2017-02-18 ENCOUNTER — Ambulatory Visit (INDEPENDENT_AMBULATORY_CARE_PROVIDER_SITE_OTHER): Payer: Medicaid Other | Admitting: *Deleted

## 2017-02-18 VITALS — BP 126/70 | HR 92 | Wt 196.3 lb

## 2017-02-18 DIAGNOSIS — O10919 Unspecified pre-existing hypertension complicating pregnancy, unspecified trimester: Secondary | ICD-10-CM

## 2017-02-18 DIAGNOSIS — O09293 Supervision of pregnancy with other poor reproductive or obstetric history, third trimester: Secondary | ICD-10-CM

## 2017-02-18 DIAGNOSIS — O10913 Unspecified pre-existing hypertension complicating pregnancy, third trimester: Secondary | ICD-10-CM

## 2017-02-18 DIAGNOSIS — O0993 Supervision of high risk pregnancy, unspecified, third trimester: Secondary | ICD-10-CM

## 2017-02-18 DIAGNOSIS — O0992 Supervision of high risk pregnancy, unspecified, second trimester: Secondary | ICD-10-CM

## 2017-02-18 DIAGNOSIS — O09299 Supervision of pregnancy with other poor reproductive or obstetric history, unspecified trimester: Secondary | ICD-10-CM

## 2017-02-18 LAB — COMPREHENSIVE METABOLIC PANEL
ALT: 25 IU/L (ref 0–32)
AST: 21 IU/L (ref 0–40)
Albumin/Globulin Ratio: 1.2 (ref 1.2–2.2)
Albumin: 3.4 g/dL — ABNORMAL LOW (ref 3.5–5.5)
Alkaline Phosphatase: 126 IU/L — ABNORMAL HIGH (ref 39–117)
BUN/Creatinine Ratio: 8 — ABNORMAL LOW (ref 9–23)
BUN: 5 mg/dL — ABNORMAL LOW (ref 6–20)
Bilirubin Total: <0.2 mg/dL (ref 0.0–1.2)
CO2: 20 mmol/L (ref 20–29)
Calcium: 8.5 mg/dL — ABNORMAL LOW (ref 8.7–10.2)
Chloride: 103 mmol/L (ref 96–106)
Creatinine, Ser: 0.63 mg/dL (ref 0.57–1.00)
GFR calc Af Amer: 142 mL/min/{1.73_m2} (ref >59)
GFR calc non Af Amer: 123 mL/min/{1.73_m2} (ref >59)
Globulin, Total: 2.8 g/dL (ref 1.5–4.5)
Glucose: 79 mg/dL (ref 65–99)
Potassium: 4.2 mmol/L (ref 3.5–5.2)
Sodium: 138 mmol/L (ref 134–144)
Total Protein: 6.2 g/dL (ref 6.0–8.5)

## 2017-02-18 LAB — CBC
Hematocrit: 31.6 % — ABNORMAL LOW (ref 34.0–46.6)
Hemoglobin: 10.8 g/dL — ABNORMAL LOW (ref 11.1–15.9)
MCH: 30.2 pg (ref 26.6–33.0)
MCHC: 34.2 g/dL (ref 31.5–35.7)
MCV: 88 fL (ref 79–97)
Platelets: 220 10*3/uL (ref 150–379)
RBC: 3.58 x10E6/uL — ABNORMAL LOW (ref 3.77–5.28)
RDW: 12.9 % (ref 12.3–15.4)
WBC: 12.4 10*3/uL — ABNORMAL HIGH (ref 3.4–10.8)

## 2017-02-18 LAB — PROTEIN / CREATININE RATIO, URINE
Creatinine, Urine: 133.1 mg/dL
Protein, Ur: 36.1 mg/dL
Protein/Creat Ratio: 271 mg/g creat — ABNORMAL HIGH (ref 0–200)

## 2017-02-18 NOTE — Progress Notes (Signed)

## 2017-02-18 NOTE — Progress Notes (Signed)
PRENATAL VISIT NOTE  Subjective:  Penny Becker is a 26 y.o. [id number] at [redacted]w[redacted]d being seen today for ongoing prenatal care.  She is currently monitored for the following issues for this high-risk pregnancy and has Encounter for supervision of high risk pregnancy in second trimester, antepartum; Hx of preeclampsia, prior pregnancy, currently pregnant; and Gestational hypertension on her problem list.  Patient reports no complaints, other than wanting to know lab results from last visit. She was told she would likely be induced this weekend pending her lab results and today's visit. She reports she has been checking her BP at home and it varies a lot based on what she doing or what she eats. Reports highest BP at home of 150/80s  Contractions: Irregular. Vag. Bleeding: Scant.  Movement: Present. Denies leaking of fluid.   The following portions of the patient's history were reviewed and updated as appropriate: allergies, current medications, past family history, past medical history, past social history, past surgical history and problem list. Problem list updated.  Objective:   Vitals:   02/18/17 1421  BP: 126/70  Pulse: 92  Weight: 196 lb 4.8 oz (89 kg)    Fetal Status: Fetal Heart Rate (bpm): 142   Movement: Present  Presentation: Vertex  General:  Alert, oriented and cooperative. Patient is in no acute distress.  Skin: Skin is warm and dry. No rash noted.   Cardiovascular: Normal heart rate noted  Respiratory: Normal respiratory effort, no problems with respiration noted  Abdomen: Soft, gravid, appropriate for gestational age.  Pain/Pressure: Present     Pelvic: Cervical exam deferred        Extremities: Normal range of motion.  Edema: Trace  Mental Status:  Normal mood and affect. Normal behavior. Normal judgment and thought content.   Assessment and Plan:  Pregnancy: A5W0981 at [redacted]w[redacted]d  1. Encounter for supervision of high risk pregnancy in second trimester, antepartum -  Culture from last visit reviewed, GBS neg - BPP today 10/10  2. ?Gestational hypertension vs CHTN Chronic hypertension noted on last visit's note, but patient denies prior diagnosis of this.  Pt seen in MAU two days and BP 141/77 (also had BP of 146/67 on 02/07/17). PIH labs drawn yesterday, reviewed today, wnl - Reviewed patient's chart extensively given history above. I did note that patient has had intermittently elevate BPs in 140s outside of pregnancy BP Readings from Last 30 Encounters:  02/18/17 126/70  02/16/17 (!) 141/77  02/14/17 137/73  02/07/17 (!) 146/67  02/04/17 123/65  01/24/17 137/72  01/10/17 127/67  12/24/16 126/66  11/25/16 128/70  11/05/16 130/65  10/25/16 116/65  10/01/16 (!) 121/55  09/24/16 120/76  09/08/16 122/62  09/04/16 123/65  08/19/16 139/64  07/14/16 117/59  07/08/16 127/61  05/13/16 125/73  04/09/16 (!) 132/53  02/16/16 148/57  02/03/16 (!) 115/52  01/24/16 121/61  08/14/15 110/72  06/23/15 123/60  04/20/15 127/82  04/18/15 127/76  02/22/15 143/80  10/18/14 125/69  11/30/13 117/68   - Discussed above findings with Dr. Erin Fulling to discuss best plan of care for patient, and decision made to manage patient as chronic hypertension given above findings.  - Discussed above with patient, and plan to follow up in 1 week with repeat antenatal testing (BPP today 10/10). I also discussed with patient that although she has never been formally diagnosed with CHTN, I believe she may mildly cHTN although she may not have needed meds for this. Patient expressed being upset with plan of care, and did not stay  to discuss this further and left.  - After visit, patient called MAU, chart was further reviewed by physician on-call, Dr. Adrian Blackwater, and given uncertainty of dx, decision made to manage as GHTN and induce at 37 weeks.  - IOL schedule for Sat 02/22/17 at [redacted]w[redacted]d  3. Hx of preeclampsia, prior pregnancy, currently pregnant - Pre-E precautions discussed  with patient  Term labor symptoms and general obstetric precautions including but not limited to vaginal bleeding, contractions, leaking of fluid and fetal movement were reviewed in detail with the patient. Please refer to After Visit Summary for other counseling recommendations.  Return in about 1 week (around 02/25/2017) for NST/BPP & HOB.   Frederik Pear, MD

## 2017-02-19 ENCOUNTER — Other Ambulatory Visit: Payer: Self-pay | Admitting: Medical

## 2017-02-19 ENCOUNTER — Telehealth: Payer: Self-pay | Admitting: Medical

## 2017-02-19 ENCOUNTER — Encounter: Payer: Self-pay | Admitting: Medical

## 2017-02-19 ENCOUNTER — Telehealth (HOSPITAL_COMMUNITY): Payer: Self-pay | Admitting: *Deleted

## 2017-02-19 ENCOUNTER — Ambulatory Visit (HOSPITAL_COMMUNITY): Payer: Medicaid Other

## 2017-02-19 LAB — CULTURE, BETA STREP (GROUP B ONLY): Strep Gp B Culture: NEGATIVE

## 2017-02-19 NOTE — Telephone Encounter (Signed)
Patient called MAU yesterday asking to speak with someone about her experience in the clinic. She had seen a provider for her routine prenatal visit. She states that the provider told her that in reviewing her chart she felt the patient had CHTN and not GHTN. The patient is adamant that this is not the case and is concerned about the well-being of her child if she is not induced at 37 weeks as would be [facility] rather than 39 weeks for [facility] in pregnancy. I have discussed the patient case with Dr. Adrian BlackwaterStinson and he has reviewed the chart and spoken to the provider who saw the patient in the office today. He states that the patient should be considered GTHN and induced at 37 weeks. I have called and scheduled the induction for Saturday morning at 7:00 am and informed the patient of where/when to arrive. The patient voiced understanding and was appreciative.   Marny LowensteinWenzel, Sherryann Frese N, PA-C 02/19/2017 10:31 AM

## 2017-02-19 NOTE — Telephone Encounter (Signed)
Preadmission screen  

## 2017-02-21 ENCOUNTER — Other Ambulatory Visit: Payer: Medicaid Other

## 2017-02-21 ENCOUNTER — Encounter: Payer: Medicaid Other | Admitting: Family Medicine

## 2017-02-22 ENCOUNTER — Inpatient Hospital Stay (HOSPITAL_COMMUNITY)
Admission: RE | Admit: 2017-02-22 | Discharge: 2017-02-24 | DRG: 807 | Disposition: A | Discharge status: Home / Self Care | Payer: Medicaid Other | Source: Ambulatory Visit | Attending: Obstetrics and Gynecology | Admitting: Obstetrics and Gynecology

## 2017-02-22 ENCOUNTER — Inpatient Hospital Stay (HOSPITAL_COMMUNITY): Payer: Medicaid Other | Admitting: Anesthesiology

## 2017-02-22 ENCOUNTER — Encounter (HOSPITAL_COMMUNITY): Payer: Self-pay

## 2017-02-22 VITALS — BP 124/75 | HR 54 | Temp 98.1°F | Resp 18 | Ht 68.0 in | Wt 196.0 lb

## 2017-02-22 DIAGNOSIS — Z87891 Personal history of nicotine dependence: Secondary | ICD-10-CM

## 2017-02-22 DIAGNOSIS — K219 Gastro-esophageal reflux disease without esophagitis: Secondary | ICD-10-CM | POA: Diagnosis present

## 2017-02-22 DIAGNOSIS — Z7982 Long term (current) use of aspirin: Secondary | ICD-10-CM | POA: Diagnosis not present

## 2017-02-22 DIAGNOSIS — O134 Gestational [pregnancy-induced] hypertension without significant proteinuria, complicating childbirth: Principal | ICD-10-CM | POA: Diagnosis present

## 2017-02-22 DIAGNOSIS — O9962 Diseases of the digestive system complicating childbirth: Secondary | ICD-10-CM | POA: Diagnosis present

## 2017-02-22 DIAGNOSIS — Z3A37 37 weeks gestation of pregnancy: Secondary | ICD-10-CM

## 2017-02-22 DIAGNOSIS — O0992 Supervision of high risk pregnancy, unspecified, second trimester: Secondary | ICD-10-CM

## 2017-02-22 DIAGNOSIS — O139 Gestational [pregnancy-induced] hypertension without significant proteinuria, unspecified trimester: Secondary | ICD-10-CM | POA: Diagnosis present

## 2017-02-22 LAB — CBC
HCT: 32.6 % — ABNORMAL LOW (ref 36.0–46.0)
HCT: 31.6 % — ABNORMAL LOW (ref 36.0–46.0)
Hemoglobin: 10.9 g/dL — ABNORMAL LOW (ref 12.0–15.0)
Hemoglobin: 10.7 g/dL — ABNORMAL LOW (ref 12.0–15.0)
MCH: 30.4 pg (ref 26.0–34.0)
MCH: 31.1 pg (ref 26.0–34.0)
MCHC: 33.4 g/dL (ref 30.0–36.0)
MCHC: 33.9 g/dL (ref 30.0–36.0)
MCV: 91.1 fL (ref 78.0–100.0)
MCV: 91.9 fL (ref 78.0–100.0)
Platelets: 178 10*3/uL (ref 150–400)
Platelets: 158 10*3/uL (ref 150–400)
RBC: 3.58 MIL/uL — ABNORMAL LOW (ref 3.87–5.11)
RBC: 3.44 MIL/uL — ABNORMAL LOW (ref 3.87–5.11)
RDW: 12.5 % (ref 11.5–15.5)
RDW: 12.6 % (ref 11.5–15.5)
WBC: 10.5 10*3/uL (ref 4.0–10.5)
WBC: 12.5 10*3/uL — ABNORMAL HIGH (ref 4.0–10.5)

## 2017-02-22 LAB — TYPE AND SCREEN
ABO/RH(D): O POS
Antibody Screen: NEGATIVE

## 2017-02-22 LAB — RPR: RPR Ser Ql: NONREACTIVE

## 2017-02-22 MED ORDER — PHENYLEPHRINE 40 MCG/ML (10ML) SYRINGE FOR IV PUSH (FOR BLOOD PRESSURE SUPPORT)
80.0000 ug | PREFILLED_SYRINGE | INTRAVENOUS | Status: DC | PRN
  Filled 2017-02-22: qty 5
  Filled 2017-02-22: qty 10

## 2017-02-22 MED ORDER — ONDANSETRON HCL 4 MG/2ML IJ SOLN: 4.0000 mg | Freq: Four times a day (QID) | INTRAMUSCULAR | Status: DC | PRN

## 2017-02-22 MED ORDER — SENNOSIDES-DOCUSATE SODIUM 8.6-50 MG PO TABS
2.0000 | ORAL_TABLET | ORAL | Status: DC
  Administered 2017-02-23 (×2): 2 via ORAL
  Filled 2017-02-22 (×2): qty 2

## 2017-02-22 MED ORDER — IBUPROFEN 600 MG PO TABS
600.0000 mg | ORAL_TABLET | Freq: Four times a day (QID) | ORAL | Status: DC
  Administered 2017-02-23 – 2017-02-24 (×7): 600 mg via ORAL
  Filled 2017-02-22 (×7): qty 1

## 2017-02-22 MED ORDER — EPHEDRINE 5 MG/ML INJ
10.0000 mg | INTRAVENOUS | Status: DC | PRN
  Filled 2017-02-22: qty 2

## 2017-02-22 MED ORDER — COCONUT OIL OIL
1.0000 "application " | TOPICAL_OIL | Status: DC | PRN
  Administered 2017-02-23: 1 via TOPICAL
  Filled 2017-02-22: qty 120

## 2017-02-22 MED ORDER — ONDANSETRON HCL 4 MG PO TABS: 4.0000 mg | ORAL_TABLET | ORAL | Status: DC | PRN

## 2017-02-22 MED ORDER — FENTANYL CITRATE (PF) 100 MCG/2ML IJ SOLN
INTRAMUSCULAR | Status: AC
  Filled 2017-02-22: qty 2

## 2017-02-22 MED ORDER — OXYCODONE-ACETAMINOPHEN 5-325 MG PO TABS: 1.0000 | ORAL_TABLET | ORAL | Status: DC | PRN

## 2017-02-22 MED ORDER — TERBUTALINE SULFATE 1 MG/ML IJ SOLN
0.2500 mg | Freq: Once | INTRAMUSCULAR | Status: DC | PRN
  Filled 2017-02-22: qty 1

## 2017-02-22 MED ORDER — FLEET ENEMA 7-19 GM/118ML RE ENEM: 1.0000 | ENEMA | RECTAL | Status: DC | PRN

## 2017-02-22 MED ORDER — ZOLPIDEM TARTRATE 5 MG PO TABS: 5.0000 mg | ORAL_TABLET | Freq: Every evening | ORAL | Status: DC | PRN

## 2017-02-22 MED ORDER — LACTATED RINGERS IV SOLN: INTRAVENOUS | Status: DC

## 2017-02-22 MED ORDER — SOD CITRATE-CITRIC ACID 500-334 MG/5ML PO SOLN: 30.0000 mL | ORAL | Status: DC | PRN

## 2017-02-22 MED ORDER — FENTANYL 2.5 MCG/ML BUPIVACAINE 1/10 % EPIDURAL INFUSION (WH - ANES)
14.0000 mL/h | INTRAMUSCULAR | Status: DC | PRN
  Administered 2017-02-22 (×2): 14 mL/h via EPIDURAL
  Filled 2017-02-22: qty 100

## 2017-02-22 MED ORDER — LIDOCAINE HCL (PF) 1 % IJ SOLN
30.0000 mL | INTRAMUSCULAR | Status: DC | PRN
  Filled 2017-02-22: qty 30

## 2017-02-22 MED ORDER — MISOPROSTOL 25 MCG QUARTER TABLET
25.0000 ug | ORAL_TABLET | ORAL | Status: DC | PRN
  Administered 2017-02-22: 25 ug via VAGINAL
  Filled 2017-02-22: qty 1

## 2017-02-22 MED ORDER — FENTANYL CITRATE (PF) 100 MCG/2ML IJ SOLN
100.0000 ug | INTRAMUSCULAR | Status: DC | PRN
  Administered 2017-02-22 (×2): 100 ug via INTRAVENOUS
  Filled 2017-02-22 (×2): qty 2

## 2017-02-22 MED ORDER — DIPHENHYDRAMINE HCL 25 MG PO CAPS: 25.0000 mg | ORAL_CAPSULE | Freq: Four times a day (QID) | ORAL | Status: DC | PRN

## 2017-02-22 MED ORDER — TETANUS-DIPHTH-ACELL PERTUSSIS 5-2.5-18.5 LF-MCG/0.5 IM SUSP: 0.5000 mL | Freq: Once | INTRAMUSCULAR | Status: DC

## 2017-02-22 MED ORDER — ACETAMINOPHEN 325 MG PO TABS
650.0000 mg | ORAL_TABLET | ORAL | Status: DC | PRN
  Administered 2017-02-22: 650 mg via ORAL
  Filled 2017-02-22: qty 2

## 2017-02-22 MED ORDER — LIDOCAINE HCL (PF) 1 % IJ SOLN
INTRAMUSCULAR | Status: DC | PRN
  Administered 2017-02-22: 4 mL
  Administered 2017-02-22: 6 mL via EPIDURAL

## 2017-02-22 MED ORDER — LACTATED RINGERS IV SOLN
500.0000 mL | Freq: Once | INTRAVENOUS | Status: AC
  Administered 2017-02-22: 500 mL via INTRAVENOUS

## 2017-02-22 MED ORDER — PRENATAL MULTIVITAMIN CH
1.0000 | ORAL_TABLET | Freq: Every day | ORAL | Status: DC
  Administered 2017-02-23 – 2017-02-24 (×2): 1 via ORAL
  Filled 2017-02-22 (×2): qty 1

## 2017-02-22 MED ORDER — OXYCODONE-ACETAMINOPHEN 5-325 MG PO TABS: 2.0000 | ORAL_TABLET | ORAL | Status: DC | PRN

## 2017-02-22 MED ORDER — ACETAMINOPHEN 325 MG PO TABS
650.0000 mg | ORAL_TABLET | ORAL | Status: DC | PRN
  Administered 2017-02-23: 650 mg via ORAL
  Filled 2017-02-22: qty 2

## 2017-02-22 MED ORDER — MISOPROSTOL 200 MCG PO TABS
50.0000 ug | ORAL_TABLET | ORAL | Status: DC | PRN
  Administered 2017-02-22: 50 ug via ORAL
  Filled 2017-02-22: qty 1

## 2017-02-22 MED ORDER — OXYTOCIN BOLUS FROM INFUSION
500.0000 mL | Freq: Once | INTRAVENOUS | Status: AC
  Administered 2017-02-22: 500 mL via INTRAVENOUS

## 2017-02-22 MED ORDER — BENZOCAINE-MENTHOL 20-0.5 % EX AERO: 1.0000 "application " | INHALATION_SPRAY | CUTANEOUS | Status: DC | PRN

## 2017-02-22 MED ORDER — SIMETHICONE 80 MG PO CHEW: 80.0000 mg | CHEWABLE_TABLET | ORAL | Status: DC | PRN

## 2017-02-22 MED ORDER — OXYTOCIN 40 UNITS IN LACTATED RINGERS INFUSION - SIMPLE MED
2.5000 [IU]/h | INTRAVENOUS | Status: DC
  Filled 2017-02-22: qty 1000

## 2017-02-22 MED ORDER — PHENYLEPHRINE 40 MCG/ML (10ML) SYRINGE FOR IV PUSH (FOR BLOOD PRESSURE SUPPORT)
80.0000 ug | PREFILLED_SYRINGE | INTRAVENOUS | Status: DC | PRN
  Filled 2017-02-22: qty 5

## 2017-02-22 MED ORDER — DIBUCAINE 1 % RE OINT: 1.0000 "application " | TOPICAL_OINTMENT | RECTAL | Status: DC | PRN

## 2017-02-22 MED ORDER — DIPHENHYDRAMINE HCL 50 MG/ML IJ SOLN: 12.5000 mg | INTRAMUSCULAR | Status: DC | PRN

## 2017-02-22 MED ORDER — FENTANYL CITRATE (PF) 100 MCG/2ML IJ SOLN
50.0000 ug | INTRAMUSCULAR | Status: DC | PRN
  Administered 2017-02-22 (×2): 50 ug via INTRAVENOUS
  Filled 2017-02-22: qty 2

## 2017-02-22 MED ORDER — ONDANSETRON HCL 4 MG/2ML IJ SOLN: 4.0000 mg | INTRAMUSCULAR | Status: DC | PRN

## 2017-02-22 MED ORDER — WITCH HAZEL-GLYCERIN EX PADS: 1.0000 "application " | MEDICATED_PAD | CUTANEOUS | Status: DC | PRN

## 2017-02-22 MED ORDER — LACTATED RINGERS IV SOLN: 500.0000 mL | INTRAVENOUS | Status: DC | PRN

## 2017-02-22 NOTE — Anesthesia Pain Management Evaluation Note (Signed)
  CRNA Pain Management Visit Note  Patient: Penny MeyerKelly Ocanas, 26 y.o., female  "Hello [facility]. We have an anesthesia team available at all times to provide care throughout the hospital, including epidural management and anesthesia for C-section. I don't know your plan for the delivery whether it a natural birth, water birth, IV sedation, nitrous supplementation, doula or epidural, but we want to meet your pain goals."   1.Was your pain managed to your expectations on prior hospitalizations?   Yes   2.What is your expectation for pain management during this hospitalization?     Epidural  3.How can we help you reach that goal? Support prn  Record the patient's initial score and the patient's pain goal.   Pain: 0  Pain Goal: 3 [facility] wants you to be able to say your pain was always managed very well.  [facility]inet[facility] Surgery CenterWRINKLE,Heraclio Seidman 02/22/2017

## 2017-02-22 NOTE — Anesthesia Procedure Notes (Signed)
Epidural Patient location during procedure: OB  Staffing Anesthesiologist: Geralene Afshar  Preanesthetic Checklist Completed: patient identified, pre-op evaluation, timeout performed, IV checked, risks and benefits discussed and monitors and equipment checked  Epidural Patient position: sitting Prep: DuraPrep Patient monitoring: blood pressure and continuous pulse ox Approach: right paramedian Location: L3-L4 Injection technique: LOR air  Needle:  Needle type: Tuohy  Needle gauge: 17 G Needle insertion depth: 6 cm Catheter type: closed end flexible Catheter size: 19 Gauge Catheter at skin depth: 12 cm Test dose: negative  Assessment Sensory level: T8  Additional Notes    Dosing of Epidural:  1st dose, through catheter .............................................  Xylocaine 40 mg  2nd dose, through catheter, after waiting 3 minutes.........Xylocaine 60 mg    As each dose occurred, patient was free of IV sx; and patient exhibited no evidence of SA injection.  Patient is more comfortable after epidural dosed. Please see RN's note for documentation of vital signs,and FHR which are stable.  Patient reminded not to try to ambulate with numb legs, and that an RN must be present when she attempts to get up.         

## 2017-02-22 NOTE — Anesthesia Preprocedure Evaluation (Signed)
Anesthesia Evaluation  Patient identified by MRN, date of birth, ID band Patient awake    Reviewed: Allergy & Precautions, H&P , Patient's Chart, lab work & pertinent test results, reviewed documented beta blocker date and time   Airway Mallampati: II  TM Distance: >3 FB Neck ROM: full    Dental no notable dental hx.    Pulmonary former smoker,    Pulmonary exam normal breath sounds clear to auscultation       Cardiovascular hypertension,  Rhythm:regular Rate:Normal     Neuro/Psych    GI/Hepatic   Endo/Other    Renal/GU      Musculoskeletal   Abdominal   Peds  Hematology   Anesthesia Other Findings   Reproductive/Obstetrics                             Anesthesia Physical Anesthesia Plan  ASA: III  Anesthesia Plan: Epidural   Post-op Pain Management:    Induction:   PONV Risk Score and Plan:   Airway Management Planned:   Additional Equipment:   Intra-op Plan:   Post-operative Plan:   Informed Consent: I have reviewed the patients History and Physical, chart, labs and discussed the procedure including the risks, benefits and alternatives for the proposed anesthesia with the patient or authorized representative who has indicated his/her understanding and acceptance.   Dental Advisory Given  Plan Discussed with: CRNA and Surgeon  Anesthesia Plan Comments: (Labs checked- platelets confirmed with RN in room. Fetal heart tracing, per RN, reported to be stable enough for sitting procedure. Discussed epidural, and patient consents to the procedure:  included risk of possible headache,backache, failed block, allergic reaction, and nerve injury. This patient was asked if she had any questions or concerns before the procedure started.)        Anesthesia Quick Evaluation

## 2017-02-22 NOTE — Progress Notes (Signed)
Labor Progress Note Penny Becker is a 26 y.o. [id number] at 6160w1d presented for IOL for gHTN. S: Pt states that she feels well, continues to feel fetal movements. No headaches, blurry vision or RUQ pain  O:  BP 129/78   Pulse 64   Temp 98.1 F (36.7 C) (Oral)   Resp 16   Ht 5\' 8"  (1.727 m)   Wt 196 lb (88.9 kg)   LMP 06/07/2016   BMI 29.80 kg/m  EFM: 140/mod vari/+ accels no decels  CVE: Dilation: 1.5 Effacement (%): Thick Station: -3 Presentation: Vertex Exam by:: J.Thornton< RN    A&P: 26 y.o. [id number] 2960w1d here fo IOL for gHTN. #Labor: Progressing well. Foley bulb placed. SROM during foley placement. Will change to PO cytotec #Pain:  Well controlled (per patient request) #FWB: cat 1 #GBS negative #gHTN (BP well controlled 129-136 systolic/ 73-78 diastolic). Continue to monitor.  Suella BroadKeriann S Eleana Tocco, MD 10:57 AM

## 2017-02-22 NOTE — Progress Notes (Addendum)
Labor Progress Note Penny Becker is a 26 y.o. [id number] at 6152w1d presented for IOL for gHTN. S: Pt reports that she is doing well. Denies headaches.   O:  BP 134/73   Pulse (!) 57   Temp 97.8 F (36.6 C) (Oral)   Resp 18   Ht 5\' 8"  (1.727 m)   Wt 196 lb (88.9 kg)   LMP 06/07/2016   BMI 29.80 kg/m  EFM: 130s/mod vari/ +accels  CVE: Dilation: 1.5 Effacement (%): Thick Station: -3 Presentation: Vertex Exam by:: J.Thornton< RN    A&P: 26 y.o. [id number] 2452w1d here for IOL for gHTN.  #Labor: Progressing well. Foley bulb in place. SROM @1030  (5hrs and 30mins). Total mcg of cytotec (75 mcg): vaginal 25mcg and PO 50mcg. #Pain: moderately controlled. (Planning for epidural) #FWB: cat 1 #GBS negative #gHTN (BP well controlled latest:  134 systolic/ 73 diastolic). Continue to monitor  Penny BroadKeriann S Zackory Pudlo, MD 4:04 PM

## 2017-02-22 NOTE — H&P (Signed)
LABOR AND DELIVERY ADMISSION HISTORY AND PHYSICAL NOTE  Penny Becker is a 26 y.o. female [phone] with IUP at [redacted]w[redacted]d by LMP presenting for IOL for gHTN.  She reports positive fetal movement. She denies leakage of fluid or vaginal bleeding.  She denies fever, chills, or recent sickness, headache, blurry vision, vomiting, abdominal pain, no pain or burning on urination, or diarrhea.  She has had some nausea and endorses GERD, or constipation.  Prenatal History/Complications: PNC at [facility] Pregnancy complications:  - Gestational HTN  Past Medical History: Past Medical History:  Diagnosis Date  . Abnormal Pap smear   . Anemia    1st pregnancy, tx'd with iron  . Chlamydia   . History of recurrent UTIs last one 2 months ago  . Prior pregnancy complicated by PIH, antepartum    during 1st pregnancy  . Subchorionic hemorrhage 10/22/10  . Vaginal Pap smear, abnormal     Past Surgical History: Past Surgical History:  Procedure Laterality Date  . DILATION AND CURETTAGE OF UTERUS    . INDUCED ABORTION     2010, 2011    Obstetrical History: OB History    Gravida Para Term Preterm AB Living   7 2 2  0 4 2   SAB TAB Ectopic Multiple Live Births   2 2 0 0 2      Social History: Social History   Social History  . Marital status: Single    Spouse name: N/A  . Number of children: N/A  . Years of education: N/A   Social History Main Topics  . Smoking status: Former Smoker    Packs/day: 1.00    Quit date: 01/20/2016  . Smokeless tobacco: Never Used  . Alcohol use No  . Drug use: No  . Sexual activity: Yes    Birth control/ protection: None   Other Topics Concern  . None   Social History Narrative  . None    Family History: Family History  Problem Relation Age of Onset  . Heart disease Maternal Grandmother   . Liver disease Maternal Grandmother   . Heart disease Maternal Grandfather   . Liver disease Maternal Grandfather   . Schizophrenia Paternal Grandmother   .  Diabetes Paternal Grandmother   . Mental illness Paternal Grandmother   . Hypertension Father   . Kidney disease Daughter        horse shoe kidneys recurrent UTI  . Hypertension Mother   . Multiple sclerosis Mother   . Anesthesia problems Neg Hx     Allergies: No Known Allergies  Prescriptions Prior to Admission  Medication Sig Dispense Refill Last Dose  . aspirin EC 81 MG tablet Take 81 mg by mouth daily.   Taking  . famotidine (PEPCID) 40 MG tablet Take 1 tablet (40 mg total) by mouth daily. (Patient not taking: Reported on 01/24/2017) 30 tablet 2 Not Taking  . flintstones complete (FLINTSTONES) 60 MG chewable tablet Chew 2 tablets by mouth daily.   Taking  . magnesium hydroxide (MILK OF MAGNESIA) 400 MG/5ML suspension Take 30 mLs by mouth daily as needed for mild constipation or moderate constipation.   Taking     Review of Systems  All systems reviewed and negative except as stated in HPI  Physical Exam Height 5\' 8"  (1.727 m), weight 196 lb (88.9 kg), last menstrual period 06/07/2016, unknown if currently breastfeeding. General appearance: alert, cooperative and no distress Lungs: clear to auscultation bilaterally Heart: regular rate and rhythm Abdomen: soft, non-tender; bowel sounds normal Extremities:  No calf swelling or tenderness Presentation: cephalic Fetal monitoring: 130bpm, moderate variability, accelerations present, decelerations absent Uterine activity: irregular 8-10 min apart    Prenatal labs: ABO, Rh: O/Positive/-- (04/16 1406) Antibody: Negative (04/16 1406) Rubella: 1.76 (04/16 1406) RPR: Non Reactive (08/21 0910)  HBsAg: Negative (04/16 1406)  HIV: NONREACTIVE (12/05 1451)  GC/Chlamydia: negative GBS:   negative 1 hr Glucola: fasting 82, 1 hr 111, 2 hr 70 Genetic screening:  normal Anatomy US: normal  Prenatal Transfer Tool  Maternal Diabetes: No Genetic Screening: Normal Maternal Ultrasounds/Referrals: Normal Fetal Ultrasounds or other  Referrals:  None Maternal Substance Abuse:  No Significant Maternal Medications:  Meds include: Other: Pepcid Significant Maternal Lab Results: None  No results found for this or any previous visit (from the past 24 hour(s)).  Patient Active Problem List   Diagnosis Date Noted  . Gestational hypertension 02/22/2017  . Chronic hypertension during pregnancy, antepartum 02/16/2017  . Hx of preeclampsia, prior pregnancy, currently pregnant 08/19/2016  . Encounter for supervision of high risk pregnancy in second trimester, antepartum 03/21/2011  . Gestational hypertension w/o significant proteinuria in 3rd trimester 03/21/2011    Assessment: Penny Becker is a 26 y.o. [id number] at 6769w1d here for IOL due to gHTN.  #Labor: induce labor with cytotec, consider foley bulb #Pain: No pain now but wants epidural #FWB: Cat I #ID:  n/a #MOF: breast pump #MOC: Nexplanon #Circ:  Boy/outpatient  Penny Becker 02/22/2017, 7:42 AM   Midwife attestation: I have seen and examined this patient; I agree with above documentation in the resident's note.   Penny Becker is a 26 y.o. [phone] here for IOL for GHTN  PE: Gen: calm comfortable, NAD Resp: normal effort, no distress Abd: gravid  ROS, labs, PMH reviewed  Assessment/Plan: Admit to LD Labor: latent FWB: Cat I ID: GBS neg  Penny Becker, CNM  02/22/2017, 9:57 AM

## 2017-02-23 MED ORDER — IBUPROFEN 600 MG PO TABS: 600.0000 mg | ORAL_TABLET | Freq: Four times a day (QID) | ORAL | 0 refills | Status: DC | PRN

## 2017-02-23 NOTE — Discharge Instructions (Signed)

## 2017-02-23 NOTE — Anesthesia Postprocedure Evaluation (Signed)
Anesthesia Post Note  Patient: Penny MeyerKelly Crist  Procedure(s) Performed: AN AD HOC LABOR EPIDURAL     Patient location during evaluation: Mother Baby Anesthesia Type: Epidural Level of consciousness: awake Pain management: satisfactory to patient Vital Signs Assessment: post-procedure vital signs reviewed and stable Respiratory status: spontaneous breathing Cardiovascular status: stable Anesthetic complications: no    Last Vitals:  Vitals:   02/22/17 2330 02/23/17 0330  BP: 129/67 116/61  Pulse: (!) 56 61  Resp: 18 18  Temp: 37.2 C 37 C  SpO2: 99% 98%    Last Pain:  Vitals:   02/23/17 0638  TempSrc:   PainSc: Asleep   Pain Goal: Patients Stated Pain Goal: 6 (02/22/17 0903)               Cephus ShellingBURGER,Hatem Cull

## 2017-02-23 NOTE — Lactation Note (Signed)
This note was copied from a baby's chart. Lactation Consultation Note Mom's 3rd baby. 9 and 28 yrs old. Didn't Bf those 2 children. Wants to BF this baby. Mom was BF in cradle position w/baby swaddled in blanket, T-shirt on when LC entered rm.  Discussed postioning and body alignment, STS, I&O, cluster feeding, newborn behavior and feeding habits. Mom encouraged to feed baby 8-12 times/24 hours and with feeding cues.  Mom has round breast w/everted nipple. Hand expression demonstrated w/easy flow of colostrum. Encouraged to assess breast for transfer.  [facility] brochure given w/resources, support groups and LC services. Mom has WIC. States BF going well. Encouraged to call for assistance or questions. Patient Name: Penny Becker [id number]'[id number]'s Date: 02/23/2017 Reason for consult: Initial assessment   Maternal Data Has patient been taught Hand Expression?: Yes Does the patient have breastfeeding experience prior to this delivery?: No  Feeding Feeding Type: Breast Fed Length of feed: 20 min  LATCH Score Latch: Grasps breast easily, tongue down, lips flanged, rhythmical sucking.  Audible Swallowing: Spontaneous and intermittent  Type of Nipple: Everted at rest and after stimulation  Comfort (Breast/Nipple): Soft / non-tender  Hold (Positioning): No assistance needed to correctly position infant at breast.  LATCH Score: 10  Interventions Interventions: Breast feeding basics reviewed;Breast compression;Adjust position;Support pillows;Breast massage;Position options;Hand express  Lactation Tools Discussed/Used WIC Program: Yes   Consult Status Consult Status: Follow-up Date: 02/23/17 Follow-up type: In-patient    [name], Diamond NickelLAURA G 02/23/2017, 3:11 AM

## 2017-02-23 NOTE — Discharge Summary (Signed)
OB Discharge Summary     Patient Name: Penny Becker DOB: 12-24-1990 MRN: [id number]  Date of admission: 02/22/2017 Delivering MD: Frederik PearEGELE, JULIE P   Date of discharge: 02/23/2017  Admitting diagnosis: 37 wk induction Intrauterine pregnancy: 8656w1d     Secondary diagnosis:  Active Problems:   Gestational hypertension  Additional problems: History of Preeclampsia     Discharge diagnosis: Term Pregnancy Delivered and Gestational Hypertension                                                                                                Post partum procedures:none  Augmentation: Pitocin and Cytotec  Complications: None  Hospital course:  Induction of Labor With Vaginal Delivery   26 y.o. yo [phone] at 2356w1d was admitted to the hospital 02/22/2017 for induction of labor.  Indication for induction: Gestational hypertension.  Patient had an uncomplicated labor course as follows: Membrane Rupture Time/Date: 10:15 AM ,02/22/2017   Intrapartum Procedures: Episiotomy: None [1]                                         Lacerations:  None [1]  Patient had delivery of a Viable infant.  Information for the patient's newborn:  Silvestre MesiSheffield, Boy Allana [[id number]][[id number]]  Delivery Method: Vaginal, Spontaneous Delivery (Filed from Delivery Summary)   02/22/2017  Details of delivery can be found in separate delivery note.  Patient had a routine postpartum course. Patient is discharged home 02/23/17.  Physical exam  Vitals:   02/22/17 2240 02/22/17 2330 02/23/17 0330 02/23/17 0844  BP: 138/60 129/67 116/61 120/68  Pulse: 74 (!) 56 61 (!) 58  Resp: 18 18 18 18   Temp: 98.8 F (37.1 C) 98.9 F (37.2 C) 98.6 F (37 C) 97.8 F (36.6 C)  TempSrc: Oral Oral Oral   SpO2: 99% 99% 98%   Weight:      Height:       General: alert, cooperative and no distress Lochia: appropriate Uterine Fundus: firm Incision: N/A DVT Evaluation: No evidence of DVT seen on physical exam. No cords or calf  tenderness. Labs: Lab Results  Component Value Date   WBC 12.5 (H) 02/22/2017   HGB 10.7 (L) 02/22/2017   HCT 31.6 (L) 02/22/2017   MCV 91.9 02/22/2017   PLT 158 02/22/2017   CMP Latest Ref Rng & Units 02/17/2017  Glucose 65 - 99 mg/dL 79  BUN 6 - 20 mg/dL 5(L)  Creatinine 1.300.57 - 1.00 mg/dL 8.650.63  Sodium 784134 - 696144 mmol/L 138  Potassium 3.5 - 5.2 mmol/L 4.2  Chloride 96 - 106 mmol/L 103  CO2 20 - 29 mmol/L 20  Calcium 8.7 - 10.2 mg/dL 2.9(B8.5(L)  Total Protein 6.0 - 8.5 g/dL 6.2  Total Bilirubin 0.0 - 1.2 mg/dL <2.8<0.2  Alkaline Phos 39 - 117 IU/L 126(H)  AST 0 - 40 IU/L 21  ALT 0 - 32 IU/L 25    Discharge instruction: per After Visit Summary and "Baby and Me Booklet".  After visit meds:  Allergies  as of 02/23/2017   No Known Allergies     Medication List    TAKE these medications   aspirin EC 81 MG tablet Take 81 mg by mouth daily.   famotidine 40 MG tablet Commonly known as:  PEPCID Take 1 tablet (40 mg total) by mouth daily.   flintstones complete 60 MG chewable tablet Chew 2 tablets by mouth daily.   ibuprofen 600 MG tablet Commonly known as:  ADVIL,MOTRIN Take 1 tablet (600 mg total) by mouth every 6 (six) hours as needed.   Melatonin 3 MG Tabs Take 3 mg by mouth at bedtime as needed (sleep).   promethazine 12.5 MG tablet Commonly known as:  PHENERGAN Take 12.5 mg by mouth every 6 (six) hours as needed for nausea or vomiting.       Diet: routine diet  Activity: Advance as tolerated. Pelvic rest for 6 weeks.   Outpatient follow up:6 weeks Follow up Appt:No future appointments. Follow up Visit:No Follow-up on file.  Postpartum contraception: Nexplanon  Newborn Data: Live born female  Birth Weight: 7 lb 0.7 oz (3195 g) APGAR: 9, 9  Newborn Delivery   Birth date/time:  02/22/2017 20:24:00 Delivery type:  Vaginal, Spontaneous Delivery      Baby Feeding: Breast Disposition:home with mother   02/23/2017 Arlyce Harman, DO  I was present at  delivery and agree with above assessment

## 2017-02-24 NOTE — Discharge Summary (Signed)
OB Discharge Summary     Patient Name: Penny Becker DOB: 1991-02-10 MRN: [id number]  Date of admission: 02/22/2017 Delivering MD: Frederik Pear   Date of discharge: 02/24/2017  Admitting diagnosis: 37 wk induction Intrauterine pregnancy: [redacted]w[redacted]d     Secondary diagnosis:  Principal Problem:   SVD (spontaneous vaginal delivery) Active Problems:   Gestational hypertension  Additional problems: History of Preeclampsia     Discharge diagnosis: Term Pregnancy Delivered and Gestational Hypertension                                                                                                Post partum procedures:none  Augmentation: Pitocin and Cytotec  Complications: None  Hospital course:  Induction of Labor With Vaginal Delivery   26 y.o. yo [phone] at [redacted]w[redacted]d was admitted to the hospital 02/22/2017 for induction of labor.  Indication for induction: Gestational hypertension.  Patient had an uncomplicated labor course as follows: Membrane Rupture Time/Date: 10:15 AM ,02/22/2017   Intrapartum Procedures: Episiotomy: None [1]                                         Lacerations:  None [1]  Patient had delivery of a Viable infant.  Information for the patient's newborn:  Macayla, Ekdahl [[id number]]  Delivery Method: Vaginal, Spontaneous Delivery (Filed from Delivery Summary)   02/22/2017  Details of delivery can be found in separate delivery note.  Patient had a routine postpartum course. Patient is discharged home 02/24/17.  Physical exam  Vitals:   02/23/17 0844 02/23/17 1900 02/23/17 2145 02/24/17 0500  BP: 120/68 125/78 124/68 124/75  Pulse: (!) 58 63 64 (!) 54  Resp: 18 20 18 18   Temp: 97.8 F (36.6 C) 98.1 F (36.7 C) 98.2 F (36.8 C) 98.1 F (36.7 C)  TempSrc:  Oral Oral Oral  SpO2:  100% 98%   Weight:      Height:       General: alert, cooperative and no distress Lochia: appropriate Uterine Fundus: firm Incision: N/A DVT Evaluation: No evidence of  DVT seen on physical exam. No cords or calf tenderness. Labs: Lab Results  Component Value Date   WBC 12.5 (H) 02/22/2017   HGB 10.7 (L) 02/22/2017   HCT 31.6 (L) 02/22/2017   MCV 91.9 02/22/2017   PLT 158 02/22/2017   CMP Latest Ref Rng & Units 02/17/2017  Glucose 65 - 99 mg/dL 79  BUN 6 - 20 mg/dL 5(L)  Creatinine 1.30 - 1.00 mg/dL 8.65  Sodium 784 - 696 mmol/L 138  Potassium 3.5 - 5.2 mmol/L 4.2  Chloride 96 - 106 mmol/L 103  CO2 20 - 29 mmol/L 20  Calcium 8.7 - 10.2 mg/dL 2.9(B)  Total Protein 6.0 - 8.5 g/dL 6.2  Total Bilirubin 0.0 - 1.2 mg/dL <2.8  Alkaline Phos 39 - 117 IU/L 126(H)  AST 0 - 40 IU/L 21  ALT 0 - 32 IU/L 25    Discharge instruction: per After Visit Summary and "Baby and  Me Booklet".  After visit meds:  Allergies as of 02/24/2017   No Known Allergies     Medication List    TAKE these medications   aspirin EC 81 MG tablet Take 81 mg by mouth daily.   famotidine 40 MG tablet Commonly known as:  PEPCID Take 1 tablet (40 mg total) by mouth daily.   flintstones complete 60 MG chewable tablet Chew 2 tablets by mouth daily.   ibuprofen 600 MG tablet Commonly known as:  ADVIL,MOTRIN Take 1 tablet (600 mg total) by mouth every 6 (six) hours as needed.   Melatonin 3 MG Tabs Take 3 mg by mouth at bedtime as needed (sleep).   promethazine 12.5 MG tablet Commonly known as:  PHENERGAN Take 12.5 mg by mouth every 6 (six) hours as needed for nausea or vomiting.       Diet: routine diet  Activity: Advance as tolerated. Pelvic rest for 6 weeks.   Outpatient follow up:  4 weeks [facility]: Please schedule this patient for PP visit in: 5-7 days for BP check High risk pregnancy complicated by: gestaional HTN Delivery mode:  SVD Anticipated Birth Control:  Nexplanon PP Procedures needed: BP check  Schedule Integrated BH visit: no Provider: Any provider  Postpartum contraception: Nexplanon  Newborn Data: Live born  female  Birth Weight: 7 lb 0.7 oz (3195 g) APGAR: 9, 9  Newborn Delivery   Birth date/time:  02/22/2017 20:24:00 Delivery type:  Vaginal, Spontaneous Delivery      Baby Feeding: Breast Disposition:home with mother   02/24/2017 Frederik PearJulie P Degele, MD

## 2017-02-24 NOTE — Lactation Note (Signed)
This note was copied from a baby's chart. Lactation Consultation Note  Patient Name: Penny Becker Reason for consult: Follow-up assessment;Infant weight loss;Early term 4937-38.6wks  Baby is 3139 hours old , 7% weight loss  This LC [facility]  LC entered the room baby wrapped up in a blanket , winter out fit and hat.  Baby woke up due to wet diaper. Baby to fussy to latch, so LC instructed mom  How to PACE feed the baby 5 ml , and then latch. ( baby more calm to latch)  LC changed it and assisted mom to latch  With her permission in cross cradle position, baby latched easily, and depth  Obtained with  Instructions of to mom to do breast compressions until swallows. Multiple swallows noted , increased with breast compressions. Breast are full, not engorged and warm.  Good sign for milk coming in and the amount of swallows. Baby fed for 13 mins , and fell asleep at the breast  And  LC instructed mom how to take the baby off safely.  Mom is concerned baby will get cold and that is why she has been feeding him with clothes on that has made him to  Be sleepy when feeding.  Sore nipple and engorgement prevention and tx reviewed .  LC instructed mom on the use hand pump and shells , #27 F given for when milk comes in if the #24 F is to snug.  Mom has an [employer] single electric pump which she used and voiced discomfort and that is the reason LC gave her  The hand pump with instructions.  Per mom active with [facility] and is aware she can contact them if needed.  Mother informed of post-discharge support and given phone number to the lactation department, including services for phone call assistance; out-patient appointments; and breastfeeding support group. List of other breastfeeding resources in the community given in the handout. Encouraged mother to call for problems or concerns related to breastfeeding.    Maternal Data Has  patient been taught Hand Expression?: Yes  Feeding Feeding Type: Breast Fed Nipple Type: Slow - flow Length of feed: 13 min (multiple swallows noted , increased with breast compressions )  LATCH Score Latch: Grasps breast easily, tongue down, lips flanged, rhythmical sucking.  Audible Swallowing: Spontaneous and intermittent  Type of Nipple: Everted at rest and after stimulation  Comfort (Breast/Nipple): Filling, red/small blisters or bruises, mild/mod discomfort  Hold (Positioning): Assistance needed to correctly position infant at breast and maintain latch.  LATCH Score: 8  Interventions Interventions: Breast feeding basics reviewed;Assisted with latch;Skin to skin;Breast massage;Hand express;Support pillows;Position options;Expressed milk  Lactation Tools Discussed/Used Tools: Shells;Pump;Flanges Flange Size: 27 Shell Type: Inverted Breast pump type: Manual WIC Program: Yes Pump Review: Setup, frequency, and cleaning;Milk Storage   Consult Status Consult Status: Complete Date: 02/24/17 Follow-up type: In-patient    Matilde SprangMargaret Ann Jeryl Wilbourn Becker, 12:17 PM

## 2017-02-25 ENCOUNTER — Other Ambulatory Visit: Payer: Medicaid Other

## 2017-02-25 ENCOUNTER — Encounter: Payer: Medicaid Other | Admitting: Obstetrics & Gynecology

## 2017-02-28 ENCOUNTER — Encounter: Payer: Medicaid Other | Admitting: Family Medicine

## 2017-03-02 ENCOUNTER — Inpatient Hospital Stay (HOSPITAL_COMMUNITY)
Admission: AD | Admit: 2017-03-02 | Discharge: 2017-03-02 | Disposition: A | Discharge status: Home / Self Care | Payer: Medicaid Other | Source: Ambulatory Visit | Attending: Obstetrics and Gynecology | Admitting: Obstetrics and Gynecology

## 2017-03-02 DIAGNOSIS — N930 Postcoital and contact bleeding: Secondary | ICD-10-CM

## 2017-03-02 DIAGNOSIS — Z79899 Other long term (current) drug therapy: Secondary | ICD-10-CM | POA: Insufficient documentation

## 2017-03-02 DIAGNOSIS — N390 Urinary tract infection, site not specified: Secondary | ICD-10-CM | POA: Diagnosis not present

## 2017-03-02 DIAGNOSIS — R319 Hematuria, unspecified: Secondary | ICD-10-CM

## 2017-03-02 DIAGNOSIS — Z87891 Personal history of nicotine dependence: Secondary | ICD-10-CM | POA: Diagnosis not present

## 2017-03-02 DIAGNOSIS — K5900 Constipation, unspecified: Secondary | ICD-10-CM

## 2017-03-02 DIAGNOSIS — Z841 Family history of disorders of kidney and ureter: Secondary | ICD-10-CM | POA: Insufficient documentation

## 2017-03-02 DIAGNOSIS — Z87440 Personal history of urinary (tract) infections: Secondary | ICD-10-CM | POA: Diagnosis not present

## 2017-03-02 DIAGNOSIS — Z8249 Family history of ischemic heart disease and other diseases of the circulatory system: Secondary | ICD-10-CM | POA: Diagnosis not present

## 2017-03-02 DIAGNOSIS — Z7982 Long term (current) use of aspirin: Secondary | ICD-10-CM | POA: Insufficient documentation

## 2017-03-02 DIAGNOSIS — R39198 Other difficulties with micturition: Secondary | ICD-10-CM

## 2017-03-02 DIAGNOSIS — R109 Unspecified abdominal pain: Secondary | ICD-10-CM

## 2017-03-02 DIAGNOSIS — R300 Dysuria: Secondary | ICD-10-CM | POA: Diagnosis present

## 2017-03-02 LAB — URINALYSIS, ROUTINE W REFLEX MICROSCOPIC
Bilirubin Urine: NEGATIVE
Glucose, UA: NEGATIVE mg/dL
Ketones, ur: NEGATIVE mg/dL
Nitrite: NEGATIVE
Protein, ur: NEGATIVE mg/dL
Specific Gravity, Urine: 1.018 (ref 1.005–1.030)
pH: 5 (ref 5.0–8.0)

## 2017-03-02 MED ORDER — SULFAMETHOXAZOLE-TRIMETHOPRIM 800-160 MG PO TABS: 1.0000 | ORAL_TABLET | Freq: Two times a day (BID) | ORAL | 0 refills | Status: AC

## 2017-03-02 MED ORDER — PHENAZOPYRIDINE HCL 200 MG PO TABS: 200.0000 mg | ORAL_TABLET | Freq: Three times a day (TID) | ORAL | 0 refills | Status: DC

## 2017-03-02 MED ORDER — MAGNESIUM CITRATE PO SOLN: 1.0000 | Freq: Once | ORAL | 0 refills | Status: AC

## 2017-03-02 MED ORDER — FLEET ENEMA 7-19 GM/118ML RE ENEM
1.0000 | ENEMA | Freq: Once | RECTAL | Status: AC
  Administered 2017-03-02: 1 via RECTAL

## 2017-03-02 NOTE — Discharge Instructions (Signed)
Constipation, Adult Constipation is when a person:  Poops (has a bowel movement) fewer times in a week than normal.  Has a hard time pooping.  Has poop that is dry, hard, or bigger than normal.  Follow these instructions at home: Eating and drinking   Eat foods that have a lot of fiber, such as: ? Fresh fruits and vegetables. ? Whole grains. ? Beans.  Eat less of foods that are high in fat, low in fiber, or overly processed, such as: ? Jamaica[place] fries. ? Hamburgers. ? Cookies. ? Candy. ? Soda.  Drink enough fluid to keep your pee (urine) clear or pale yellow. General instructions  Exercise regularly or as told by your doctor.  Go to the restroom when you feel like you need to poop. Do not hold it in.  Take over-the-counter and prescription medicines only as told by your doctor. These include any fiber supplements.  Do pelvic floor retraining exercises, such as: ? Doing deep breathing while relaxing your lower belly (abdomen). ? Relaxing your pelvic floor while pooping.  Watch your condition for any changes.  Keep all follow-up visits as told by your doctor. This is important. Contact a doctor if:  You have pain that gets worse.  You have a fever.  You have not pooped for 4 days.  You throw up (vomit).  You are not hungry.  You lose weight.  You are bleeding from the anus.  You have thin, pencil-like poop (stool). Get help right away if:  You have a fever, and your symptoms suddenly get worse.  You leak poop or have blood in your poop.  Your belly feels hard or bigger than normal (is bloated).  You have very bad belly pain.  You feel dizzy or you faint. This information is not intended to replace advice given to you by your health care provider. Make sure you discuss any questions you have with your health care provider. Document Released: 10/09/2007 Document Revised: 11/10/2015 Document Reviewed: 10/11/2015 Elsevier Interactive Patient Education   2017 Elsevier Inc. Urinary Tract Infection, Adult A urinary tract infection (UTI) is an infection of any part of the urinary tract. The urinary tract includes the:  Kidneys.  Ureters.  Bladder.  Urethra.  These organs make, store, and get rid of pee (urine) in the body. Follow these instructions at home:  Take over-the-counter and prescription medicines only as told by your doctor.  If you were prescribed an antibiotic medicine, take it as told by your doctor. Do not stop taking the antibiotic even if you start to feel better.  Avoid the following drinks: ? Alcohol. ? Caffeine. ? Tea. ? Carbonated drinks.  Drink enough fluid to keep your pee clear or pale yellow.  Keep all follow-up visits as told by your doctor. This is important.  Make sure to: ? Empty your bladder often and completely. Do not to hold pee for long periods of time. ? Empty your bladder before and after sex. ? Wipe from front to back after a bowel movement if you are female. Use each tissue one time when you wipe. Contact a doctor if:  You have back pain.  You have a fever.  You feel sick to your stomach (nauseous).  You throw up (vomit).  Your symptoms do not get better after 3 days.  Your symptoms go away and then come back. Get help right away if:  You have very bad back pain.  You have very bad lower belly (abdominal) pain.  You are throwing up and cannot keep down any medicines or water. This information is not intended to replace advice given to you by your health care provider. Make sure you discuss any questions you have with your health care provider. Document Released: 10/09/2007 Document Revised: 09/28/2015 Document Reviewed: 03/13/2015 Elsevier Interactive Patient Education  [employer].

## 2017-03-02 NOTE — Progress Notes (Signed)
History     CSN: [id number]  Arrival date and time: 03/02/17 [id number]   First Provider Initiated Contact with Patient 03/02/17 1857      Chief Complaint  Patient presents with  . Dysuria   Dysuria   This is a new problem. The current episode started in the past 7 days. The problem occurs every urination. The problem has been gradually worsening. Quality: pressure. The pain is severe. There has been no fever. There is no history of pyelonephritis. Associated symptoms include flank pain and urgency. Associated symptoms comments: Mild left CVA tenderness. She has tried nothing for the symptoms.  Constipation  This is a chronic problem. The current episode started in the past 7 days. The problem has been gradually worsening since onset. The stool is described as pellet like. The patient is not on a high fiber diet. She does not exercise regularly. There has not been adequate water intake. Associated symptoms include abdominal pain, back pain, difficulty urinating and rectal pain. Pertinent negatives include no fever. Risk factors include recent dehydration. She has tried stool softeners for the symptoms. The treatment provided no relief.    OB History as of 02/27/17    Gravida Para Term Preterm AB Living   7 3 3  0 4 3   SAB TAB Ectopic Multiple Live Births   2 2 0 0 3      Past Medical History:  Diagnosis Date  . Abnormal Pap smear   . Anemia    1st pregnancy, tx'd with iron  . Chlamydia   . History of recurrent UTIs last one 2 months ago  . Prior pregnancy complicated by PIH, antepartum    during 1st pregnancy  . Subchorionic hemorrhage 10/22/10  . Vaginal Pap smear, abnormal     Past Surgical History:  Procedure Laterality Date  . DILATION AND CURETTAGE OF UTERUS    . INDUCED ABORTION     2010, 2011    Family History  Problem Relation Age of Onset  . Heart disease Maternal Grandmother   . Liver disease Maternal Grandmother   . Heart disease Maternal Grandfather   .  Liver disease Maternal Grandfather   . Schizophrenia Paternal Grandmother   . Diabetes Paternal Grandmother   . Mental illness Paternal Grandmother   . Hypertension Father   . Kidney disease Daughter        horse shoe kidneys recurrent UTI  . Hypertension Mother   . Multiple sclerosis Mother   . Anesthesia problems Neg Hx     Social History  Substance Use Topics  . Smoking status: Former Smoker    Packs/day: 1.00    Quit date: 01/20/2016  . Smokeless tobacco: Never Used  . Alcohol use No    Allergies: No Known Allergies  Prescriptions Prior to Admission  Medication Sig Dispense Refill Last Dose  . aspirin EC 81 MG tablet Take 81 mg by mouth daily.   Past Month at Unknown time  . famotidine (PEPCID) 40 MG tablet Take 1 tablet (40 mg total) by mouth daily. (Patient not taking: Reported on 01/24/2017) 30 tablet 2 Not Taking at Unknown time  . flintstones complete (FLINTSTONES) 60 MG chewable tablet Chew 2 tablets by mouth daily.   02/21/2017 at Unknown time  . ibuprofen (ADVIL,MOTRIN) 600 MG tablet Take 1 tablet (600 mg total) by mouth every 6 (six) hours as needed. 40 tablet 0   . Melatonin 3 MG TABS Take 3 mg by mouth at bedtime as needed (sleep).  Past Week at Unknown time  . promethazine (PHENERGAN) 12.5 MG tablet Take 12.5 mg by mouth every 6 (six) hours as needed for nausea or vomiting.   Past Week at Unknown time    Review of Systems  Constitutional: Negative for fever.  Gastrointestinal: Positive for abdominal pain, constipation and rectal pain.  Genitourinary: Positive for difficulty urinating, dysuria, flank pain and urgency.  Musculoskeletal: Positive for back pain.   Physical Exam   Blood pressure 129/78, pulse 80, temperature 98.1 F (36.7 C), temperature source Oral, resp. rate 16, height 5\' 7"  (1.702 m), weight 84.4 kg (186 lb), last menstrual period 06/07/2016, unknown if currently breastfeeding.  Physical Exam  Nursing note and vitals  reviewed. Constitutional: She is oriented to person, place, and time. She appears well-developed and well-nourished.  HENT:  Head: Normocephalic.  Eyes: Pupils are equal, round, and reactive to light.  Neck: Normal range of motion. Neck supple.  Cardiovascular: Normal rate, regular rhythm and normal heart sounds.   Respiratory: Effort normal.  GI: Soft. She exhibits no distension. There is tenderness.  LLQ tender to palpation.  Bladder not distended.  Fundus U-5.    Genitourinary: Vagina normal and uterus normal.  Musculoskeletal: Normal range of motion. She exhibits no edema.  Neurological: She is alert and oriented to person, place, and time.  Skin: Skin is warm and dry.  Psychiatric: She has a normal mood and affect. Her behavior is normal.    MAU Course  Procedures   Assessment and Plan  UTI Constipation  Urine sent for cultures Fleet enema prior to d/c  Bactrim 1 tab po BID x 7d Pyridium po TID prn Mag citrate to take home Encouraged to increase fluid intake  Cinthya Bowmaker-Kareen, SNM 03/02/2017, 7:17 PM  I assessed this pt and agree with above assessment

## 2017-03-02 NOTE — MAU Note (Signed)
Delivered SVD on 22 Feb 2017, and hasnt had a BM since delivery. She sreports a constant, full bladder, for the past four days and only able to dribble.  States her left flank pain started today. Denies any fever, but off and on nausea.

## 2017-03-02 NOTE — Progress Notes (Addendum)
G7P3 PP who delivered SVD on 10/20. No BM since delivery and has difficulty voiding and states "dribble". Also c/o left flank pain with nausea that comes and goes.   Provider at bs assessing   Good result from fleet enema.   Discharge instructions given with pt understanding. Pt left unit via ambulatory.

## 2017-03-03 ENCOUNTER — Ambulatory Visit: Payer: Medicaid Other

## 2017-03-04 LAB — URINE CULTURE: Culture: <10000 — AB

## 2017-03-10 ENCOUNTER — Encounter: Payer: Self-pay | Admitting: Family Medicine

## 2017-03-31 ENCOUNTER — Ambulatory Visit: Payer: Medicaid Other | Admitting: Family Medicine

## 2017-08-11 ENCOUNTER — Emergency Department: Payer: Medicaid Other

## 2017-08-11 ENCOUNTER — Encounter: Payer: Self-pay | Admitting: Emergency Medicine

## 2017-08-11 ENCOUNTER — Emergency Department
Admission: EM | Admit: 2017-08-11 | Discharge: 2017-08-11 | Disposition: A | Discharge status: Home / Self Care | Payer: Medicaid Other | Attending: Emergency Medicine | Admitting: Emergency Medicine

## 2017-08-11 DIAGNOSIS — O4690 Antepartum hemorrhage, unspecified, unspecified trimester: Secondary | ICD-10-CM

## 2017-08-11 DIAGNOSIS — Z87891 Personal history of nicotine dependence: Secondary | ICD-10-CM | POA: Diagnosis not present

## 2017-08-11 DIAGNOSIS — Z79899 Other long term (current) drug therapy: Secondary | ICD-10-CM | POA: Insufficient documentation

## 2017-08-11 DIAGNOSIS — Z7982 Long term (current) use of aspirin: Secondary | ICD-10-CM | POA: Diagnosis not present

## 2017-08-11 DIAGNOSIS — O26851 Spotting complicating pregnancy, first trimester: Secondary | ICD-10-CM | POA: Diagnosis present

## 2017-08-11 DIAGNOSIS — O200 Threatened abortion: Secondary | ICD-10-CM

## 2017-08-11 DIAGNOSIS — Z3A01 Less than 8 weeks gestation of pregnancy: Secondary | ICD-10-CM | POA: Insufficient documentation

## 2017-08-11 LAB — HCG, QUANTITATIVE, PREGNANCY: hCG, Beta Chain, Quant, S: 6526 m[IU]/mL — ABNORMAL HIGH (ref <5)

## 2017-08-11 LAB — ABO/RH: ABO/RH(D): O POS

## 2017-08-11 LAB — POCT PREGNANCY, URINE: Preg Test, Ur: POSITIVE — AB

## 2017-08-11 NOTE — ED Notes (Signed)
Pt signed esignature.  D/c  inst to pt.  

## 2017-08-11 NOTE — Discharge Instructions (Addendum)
At this time you are either having bleeding during a normal pregnancy, or could be having the beginning of a miscarriage.  It is also possible that you could have an ectopic pregnancy (meaning a pregnancy not in the normal place).  It is very important that you follow-up in 2 days to have a repeat of your hCG (which was 6500 today) and a repeat ultrasound if needed.  You should follow-up with your regular [place] in [place] if you are able to, or with the [facility] here.  If you are unable to arrange follow-up with either of these in 2 days, it is essential that you return to the emergency department for this repeat.  You should return immediately for new or worsening pain, bleeding, weakness, or any other new or worsening symptoms that concern you.

## 2017-08-11 NOTE — ED Notes (Signed)
Pt reports recent positive home preg test, started having bleeding and abd cramping.

## 2017-08-11 NOTE — ED Provider Notes (Signed)
[facility] Emergency Department Provider Note ____________________________________________   First MD Initiated Contact with Patient 08/11/17 1529     (approximate)  I have reviewed the triage vital signs and the nursing notes.   HISTORY  Chief Complaint Vaginal Bleeding and Abdominal Pain    HPI Penny Becker is a 27 y.o. female with past medical history as noted below who is a G6P3 at likely approximately 5-6 weeks who presents with lower abdominal cramping for the last several days, intermittent, and associated with vaginal spotting and some passage of clots.  Patient denies any associated weakness or lightheadedness, vomiting, urinary symptoms, or other acute symptoms.   Past Medical History:  Diagnosis Date  . Abnormal Pap smear   . Anemia    1st pregnancy, tx'd with iron  . Chlamydia   . History of recurrent UTIs last one 2 months ago  . Prior pregnancy complicated by PIH, antepartum    during 1st pregnancy  . Subchorionic hemorrhage 10/22/10  . Vaginal Pap smear, abnormal     Patient Active Problem List   Diagnosis Date Noted  . Constipation 03/02/2017  . SVD (spontaneous vaginal delivery) 02/24/2017  . Gestational hypertension 02/22/2017  . Chronic hypertension during pregnancy, antepartum 02/16/2017  . Hx of preeclampsia, prior pregnancy, currently pregnant 08/19/2016  . Encounter for supervision of high risk pregnancy in second trimester, antepartum 03/21/2011  . Gestational hypertension w/o significant proteinuria in 3rd trimester 03/21/2011    Past Surgical History:  Procedure Laterality Date  . DILATION AND CURETTAGE OF UTERUS    . INDUCED ABORTION     2010, 2011    Prior to Admission medications   Medication Sig Start Date End Date Taking? Authorizing Provider  aspirin EC 81 MG tablet Take 81 mg by mouth daily.    [provider]  famotidine (PEPCID) 40 MG tablet Take 1 tablet (40 mg total) by mouth  daily. Patient not taking: Reported on 01/24/2017 01/10/17   Adam Phenix, MD  flintstones complete (FLINTSTONES) 60 MG chewable tablet Chew 2 tablets by mouth daily.    [provider]  ibuprofen (ADVIL,MOTRIN) 600 MG tablet Take 1 tablet (600 mg total) by mouth every 6 (six) hours as needed. 02/23/17   Arlyce Harman, DO  Melatonin 3 MG TABS Take 3 mg by mouth at bedtime as needed (sleep).    [provider]  phenazopyridine (PYRIDIUM) 200 MG tablet Take 1 tablet (200 mg total) by mouth 3 (three) times daily. 03/02/17   Montez Morita, CNM  promethazine (PHENERGAN) 12.5 MG tablet Take 12.5 mg by mouth every 6 (six) hours as needed for nausea or vomiting.    [provider]    Allergies Patient has no known allergies.  Family History  Problem Relation Age of Onset  . Heart disease Maternal Grandmother   . Liver disease Maternal Grandmother   . Heart disease Maternal Grandfather   . Liver disease Maternal Grandfather   . Schizophrenia Paternal Grandmother   . Diabetes Paternal Grandmother   . Mental illness Paternal Grandmother   . Hypertension Father   . Kidney disease Daughter        horse shoe kidneys recurrent UTI  . Hypertension Mother   . Multiple sclerosis Mother   . Anesthesia problems Neg Hx     Social History Social History   Tobacco Use  . Smoking status: Former Smoker    Packs/day: 1.00    Last attempt to quit: 01/20/2016  Years since quitting: 1.5  . Smokeless tobacco: Never Used  Substance Use Topics  . Alcohol use: No  . Drug use: No    Review of Systems  Constitutional: No fever. Eyes: No redness. ENT: No sore throat. Cardiovascular: Denies chest pain. Respiratory: Denies shortness of breath. Gastrointestinal: No vomiting.  Genitourinary: Positive for vaginal bleeding.  Musculoskeletal: Negative for back pain. Skin: Negative for rash. Neurological: Negative for  headache.   ____________________________________________   PHYSICAL EXAM:  VITAL SIGNS: ED Triage Vitals  Enc Vitals Group     BP 08/11/17 1415 130/70     Pulse Rate 08/11/17 1415 76     Resp 08/11/17 1505 18     Temp 08/11/17 1415 98.6 F (37 C)     Temp Source 08/11/17 1415 Oral     SpO2 08/11/17 1415 100 %     Weight 08/11/17 1416 170 lb (77.1 kg)     Height 08/11/17 1416 5\' 8"  (1.727 m)     Head Circumference --      Peak Flow --      Pain Score 08/11/17 1416 5     Pain Loc --      Pain Edu? --      Excl. in GC? --     Constitutional: Alert and oriented. Well appearing and in no acute distress. Eyes: Conjunctivae are normal.  Head: Atraumatic. Nose: No congestion/rhinnorhea. Mouth/Throat: Mucous membranes are moist.   Neck: Normal range of motion.  Cardiovascular: Good peripheral circulation. Respiratory: Normal respiratory effort. Gastrointestinal:No distention.  Musculoskeletal: No lower extremity edema.   Neurologic:  Normal speech and language. No gross focal neurologic deficits are appreciated.  Skin:  Skin is warm and dry. No rash noted. Psychiatric: Mood and affect are normal. Speech and behavior are normal.  ____________________________________________   LABS (all labs ordered are listed, but only abnormal results are displayed)  Labs Reviewed  HCG, QUANTITATIVE, PREGNANCY - Abnormal; Notable for the following components:      Result Value   hCG, Beta Chain, Quant, S 6,526 (*)    All other components within normal limits  POCT PREGNANCY, URINE - Abnormal; Notable for the following components:   Preg Test, Ur POSITIVE (*)    All other components within normal limits  POC URINE PREG, ED  ABO/RH   ____________________________________________  EKG   ____________________________________________  RADIOLOGY  US ob transvaginal: Intrauterine gestational sac consistent with early  IUP  ____________________________________________   PROCEDURES  Procedure(s) performed: No  Procedures  Critical Care performed: No ____________________________________________   INITIAL IMPRESSION / ASSESSMENT AND PLAN / ED COURSE  Pertinent labs & imaging results that were available during my care of the patient were reviewed by me and considered in my medical decision making (see chart for details).  [age] year old female G6P3 at approximately 5-6 weeks presents with lower abdominal cramping and vaginal bleeding for the last several days.  On exam, patient is extremely well-appearing, vital signs are normal, and she has minimal acute pain at this time.  Differential includes most likely spontaneous miscarriage, threatened miscarriage, or less likely ectopic.  We will obtain hCG and ultrasound to evaluate.    ----------------------------------------- 5:29 PM on 08/11/2017 -----------------------------------------  Ultrasound shows gestational sac consistent with early IUP, however there is no yolk sac, or FHR to confirm definitive IUP.  Given that the patient is stable, with normal vital signs, minimal pain, no active hemorrhage, she is appropriate for discharge home.  She follows with OB[place]GYN in [place].  I  instructed her to call there to arrange follow-up in 2 days, but I also provided information for [facility] clinic OB/GYN if they are not able to see her in [place], and instructed the patient to come to the emergency department if she is not able to be seen anywhere else in 2 days for repeat hCG and ultrasound.  I discussed the results of the workup, the diagnosis and unlikely possibility of ectopic, and gave ectopic return precautions.  The patient expressed understanding and agreement.  She feels well to go home.   ____________________________________________   FINAL CLINICAL IMPRESSION(S) / ED DIAGNOSES  Final diagnoses:  Threatened miscarriage      NEW  MEDICATIONS STARTED DURING THIS VISIT:  New Prescriptions   No medications on file     Note:  This document was prepared using Dragon voice recognition software and may include unintentional dictation errors.    Dionne Bucy, MD 08/11/17 [phone]

## 2017-08-11 NOTE — ED Triage Notes (Signed)
Pt reports took a pregnancy test and it was positive on 3/30 and a few days ago started with some abd cramping and bleeding.

## 2017-08-25 ENCOUNTER — Encounter: Payer: Self-pay | Admitting: Obstetrics and Gynecology

## 2017-08-25 ENCOUNTER — Ambulatory Visit: Payer: Medicaid Other | Admitting: Obstetrics and Gynecology

## 2017-08-25 VITALS — BP 130/68 | HR 69 | Wt 177.0 lb

## 2017-08-25 DIAGNOSIS — O200 Threatened abortion: Secondary | ICD-10-CM

## 2017-08-25 NOTE — Progress Notes (Signed)
Pt left office prior to blood being drawn for BHCG. US cannot be scheduled at time of pt visit due to being after 5pm.  Pt will be contacted tomorrow with US appt details and notified of need for BHCG which must be completed prior to insertion of Nexplanon.

## 2017-08-25 NOTE — Progress Notes (Signed)
   GYNECOLOGY OFFICE VISIT NOTE  History:  27 y.o. [id number] here today for threatened AB. Patient was [facility] ED on 4/8 for bleeding in early pregnancy. Hcg at that time was elevated and US showed gestational sac only. Patient was to follow-up in clinic for serial hcgs but never came. States that she believes she had a miscarriage because a day later had heavy bleeding with clots and cramping. Has had miscarriage before and states this seems similar. Patient denies any continued bleeding. Cramping is occasional. She denies any abnormal vaginal discharge, bleeding, pelvic pain or other concerns.  Wants to get a Nexplanon at today's visit.   Past Medical History:  Diagnosis Date  . Abnormal Pap smear   . Anemia    1st pregnancy, tx'd with iron  . Chlamydia   . History of recurrent UTIs last one 2 months ago  . Prior pregnancy complicated by PIH, antepartum    during 1st pregnancy  . Subchorionic hemorrhage 10/22/10  . Vaginal Pap smear, abnormal     Past Surgical History:  Procedure Laterality Date  . DILATION AND CURETTAGE OF UTERUS    . INDUCED ABORTION     2010, 2011    Review of Systems:  Pertinent items noted in HPI and remainder of comprehensive ROS otherwise negative.   Objective:  Physical Exam BP 130/68   Pulse 69   Wt 80.3 kg (177 lb)   LMP 07/04/2017   BMI 26.91 kg/m  CONSTITUTIONAL: Well-developed, well-nourished female in no acute distress.  NECK: Normal range of motion, supple, no masses SKIN: Skin is warm and dry.  NEUROLOGIC: Alert and oriented to person, place, and time.  PSYCHIATRIC: Normal mood and affect. CARDIOVASCULAR: Normal heart rate noted RESPIRATORY: Effort normal ABDOMEN: Soft, no distention noted.    Labs and Imaging Koreas Ob Less Than 14 Weeks With Ob Transvaginal  Result Date: 08/11/2017 CLINICAL DATA:  Vaginal bleeding and pelvic cramping for 6 days. Gestational age by LMP of 5 weeks 3 days. EXAM: OBSTETRIC <14 WK US AND TRANSVAGINAL  OB US TECHNIQUE: Both transabdominal and transvaginal ultrasound examinations were performed for complete evaluation of the gestation as well as the maternal uterus, adnexal regions, and pelvic cul-de-sac. Transvaginal technique was performed to assess early pregnancy. COMPARISON:  None. FINDINGS: Intrauterine gestational sac: Single, with double decidual sac sign Yolk sac:  Not Visualized. Embryo:  Not Visualized. MSD: 7 mm   5 w   3 d Subchorionic hemorrhage:  None visualized. Maternal uterus/adnexae: Retroverted uterus. Normal appearance of both ovaries. No mass or abnormal free fluid identified. IMPRESSION: Single intrauterine gestational sac measuring 5 weeks 3 days by mean sac diameter. This is concordant with LMP. Consider following serial b-hCG levels, with followup ultrasound to assess viability in 14 days. Electronically Signed   By: Myles RosenthalJohn  Stahl M.D.   On: 08/11/2017 16:27    Assessment & Plan:  1. Threatened miscarriage Likely had miscarriage. To collect hcg and follow-up [place]. Unable to give birth control today without knowing her pregnancy status. This was explained to patient. To try and schedule follow-up birth control visit in a week. - [place] OB Transvaginal; Future - Beta hCG quant (ref lab)  Return in about 1 week (around 09/01/2017) for Nexplanon insertion.  Caryl AdaJazma Tauriel Scronce, DO OB Fellow [facility], [facility]

## 2017-08-25 NOTE — Progress Notes (Signed)
Had positive upt 4/1 and then couple days later started bleeding. Was [facility] and was told saw sac in uterus but threatened SAB. Did not f/u for HCGs due to working, etc. Also passed clots and assumed had SAB. No bleeding today but some cramping. Would like Nexplanon

## 2017-08-27 ENCOUNTER — Telehealth: Payer: Self-pay | Admitting: *Deleted

## 2017-08-27 ENCOUNTER — Encounter: Payer: Self-pay | Admitting: *Deleted

## 2017-08-27 NOTE — Telephone Encounter (Signed)
Called pt regarding tests which were ordered by Dr. Doroteo GlassmanPhelps on 4/22. I left message stating that she needs follow up US and blood drawn prior to next appt on 4/29 for Nexplanon insertion. I stated that I will schedule US appt and asked her to check her MyChart account for the information. Message also sent via MyChart.

## 2017-09-01 ENCOUNTER — Ambulatory Visit (HOSPITAL_COMMUNITY): Payer: Medicaid Other

## 2017-09-01 ENCOUNTER — Ambulatory Visit: Payer: Medicaid Other | Admitting: Obstetrics and Gynecology

## 2017-09-05 ENCOUNTER — Other Ambulatory Visit (INDEPENDENT_AMBULATORY_CARE_PROVIDER_SITE_OTHER): Payer: Medicaid Other

## 2017-09-05 ENCOUNTER — Ambulatory Visit (HOSPITAL_COMMUNITY)
Admission: RE | Admit: 2017-09-05 | Discharge: 2017-09-05 | Disposition: A | Discharge status: Home / Self Care | Payer: Medicaid Other | Source: Ambulatory Visit | Attending: Obstetrics and Gynecology | Admitting: Obstetrics and Gynecology

## 2017-09-05 DIAGNOSIS — Z3A09 9 weeks gestation of pregnancy: Secondary | ICD-10-CM | POA: Insufficient documentation

## 2017-09-05 DIAGNOSIS — O3680X0 Pregnancy with inconclusive fetal viability, not applicable or unspecified: Secondary | ICD-10-CM

## 2017-09-05 DIAGNOSIS — O200 Threatened abortion: Secondary | ICD-10-CM | POA: Insufficient documentation

## 2017-09-05 DIAGNOSIS — O283 Abnormal ultrasonic finding on antenatal screening of mother: Secondary | ICD-10-CM

## 2017-09-05 LAB — HCG, QUANTITATIVE, PREGNANCY: hCG, Beta Chain, Quant, S: 8 m[IU]/mL — ABNORMAL HIGH (ref <5)

## 2017-09-05 NOTE — Progress Notes (Signed)
Here for bhcg and [place] results.  [place] results called to [place] by radiology.  Reviewed [place] results with [place].and stat bhcg ordered.  Patient was completing lab draw. Informed patient we need to send blood for stat bhcg and have her wait for resulsts in lobby.Evarose denies bleeding or pain today. States after first [place] she did pass some chunky blood.  She voices understanding. She is requesting birth control. She wants nexplanon. Explained will have to make appointment for that- she wants pills for until then. She admitted she had unprotected intercourse last night. Discussed with provider and we can not give birth control pills until levels return to normal and it has been 2 weeks or longer until no unprotected intercourse. Explained she can make appt today for nexplanon  And must abstain from intercourse or use condoms. Offered to give her free condoms which she declined.    Reviewed results with Luna Kitchens, CNM and informed patient levels dropped significantly ; but we recommend another draw in a week until levels 5 or less. Informed her she can ask for ocp's once we call her with her next lab results if they are 5 or less ; but may need to see a provider . She voices understanding.

## 2017-09-05 NOTE — Progress Notes (Signed)
Chart reviewed for nurse visit. Agree with plan of care.   Marylene Land, CNM 09/05/2017 2:33 PM

## 2017-10-15 ENCOUNTER — Encounter: Payer: Medicaid Other | Admitting: Family Medicine

## 2017-10-16 ENCOUNTER — Encounter: Payer: Medicaid Other | Admitting: Obstetrics and Gynecology

## 2017-10-17 ENCOUNTER — Other Ambulatory Visit: Payer: Self-pay

## 2017-10-17 ENCOUNTER — Encounter (HOSPITAL_COMMUNITY): Payer: Self-pay | Admitting: *Deleted

## 2017-10-17 ENCOUNTER — Inpatient Hospital Stay (HOSPITAL_COMMUNITY)
Admission: AD | Admit: 2017-10-17 | Discharge: 2017-10-17 | Disposition: A | Discharge status: Home / Self Care | Payer: Medicaid Other | Source: Ambulatory Visit | Attending: Obstetrics and Gynecology | Admitting: Obstetrics and Gynecology

## 2017-10-17 ENCOUNTER — Inpatient Hospital Stay (HOSPITAL_COMMUNITY): Payer: Medicaid Other

## 2017-10-17 DIAGNOSIS — O283 Abnormal ultrasonic finding on antenatal screening of mother: Secondary | ICD-10-CM

## 2017-10-17 DIAGNOSIS — R109 Unspecified abdominal pain: Secondary | ICD-10-CM | POA: Diagnosis not present

## 2017-10-17 DIAGNOSIS — O26899 Other specified pregnancy related conditions, unspecified trimester: Secondary | ICD-10-CM

## 2017-10-17 DIAGNOSIS — O3680X0 Pregnancy with inconclusive fetal viability, not applicable or unspecified: Secondary | ICD-10-CM | POA: Diagnosis not present

## 2017-10-17 DIAGNOSIS — Z87440 Personal history of urinary (tract) infections: Secondary | ICD-10-CM | POA: Diagnosis not present

## 2017-10-17 DIAGNOSIS — O26891 Other specified pregnancy related conditions, first trimester: Secondary | ICD-10-CM | POA: Insufficient documentation

## 2017-10-17 DIAGNOSIS — Z3A01 Less than 8 weeks gestation of pregnancy: Secondary | ICD-10-CM | POA: Diagnosis present

## 2017-10-17 LAB — POCT PREGNANCY, URINE
Preg Test, Ur: POSITIVE — AB
Preg Test, Ur: POSITIVE — AB

## 2017-10-17 LAB — HCG, QUANTITATIVE, PREGNANCY: hCG, Beta Chain, Quant, S: 134 m[IU]/mL — ABNORMAL HIGH (ref <5)

## 2017-10-17 LAB — URINALYSIS, ROUTINE W REFLEX MICROSCOPIC
Bilirubin Urine: NEGATIVE
Glucose, UA: NEGATIVE mg/dL
Hgb urine dipstick: NEGATIVE
Ketones, ur: NEGATIVE mg/dL
Leukocytes, UA: NEGATIVE
Nitrite: NEGATIVE
Protein, ur: NEGATIVE mg/dL
Specific Gravity, Urine: 1.025 (ref 1.005–1.030)
pH: 5.5 (ref 5.0–8.0)

## 2017-10-17 NOTE — Discharge Instructions (Signed)

## 2017-10-17 NOTE — MAU Provider Note (Addendum)
History     CSN: [id number]  Arrival date and time: 10/17/17 1318   First Provider Initiated Contact with Patient 10/17/17 1530      Chief Complaint  Patient presents with  . Birth Control   HPI   Penny Becker is a 27 y.o. female [phone] @ [redacted]w[redacted]d here with concerns about pregnancy. Says in April 2019 she was diagnosed with an SAB, she had an US done on 5/3 that showed everything had passed. She had a pregnancy hormone level that dropped from 6,000 to 8. Says she missed her appointment in the clinic where she was supposed to have nexplanon placed after her levels dropped. Says she has had intercourse since the SAB; says it was always protected and she was taking BC. Says she was taking BC that she got from a friend.  Says she took a test at home a few days ago and the test was positive.  Rates her pain 0/10 at this time, no pain.  No bleeding at this time.   OB History    Gravida  8   Para  3   Term  3   Preterm  0   AB  4   Living  3     SAB  2   TAB  2   Ectopic  0   Multiple  0   Live Births  3           Past Medical History:  Diagnosis Date  . Abnormal Pap smear   . Anemia    1st pregnancy, tx'd with iron  . Chlamydia   . History of recurrent UTIs last one 2 months ago  . Prior pregnancy complicated by PIH, antepartum    during 1st pregnancy  . Subchorionic hemorrhage 10/22/10  . Vaginal Pap smear, abnormal     Past Surgical History:  Procedure Laterality Date  . DILATION AND CURETTAGE OF UTERUS    . INDUCED ABORTION     2010, 2011    Family History  Problem Relation Age of Onset  . Heart disease Maternal Grandmother   . Liver disease Maternal Grandmother   . Heart disease Maternal Grandfather   . Liver disease Maternal Grandfather   . Schizophrenia Paternal Grandmother   . Diabetes Paternal Grandmother   . Mental illness Paternal Grandmother   . Hypertension Father   . Kidney disease Daughter        horse shoe kidneys recurrent UTI   . Hypertension Mother   . Multiple sclerosis Mother   . Anesthesia problems Neg Hx     Social History   Tobacco Use  . Smoking status: Former Smoker    Packs/day: 1.00    Last attempt to quit: 01/20/2016    Years since quitting: 1.7  . Smokeless tobacco: Never Used  Substance Use Topics  . Alcohol use: No  . Drug use: No    Allergies: No Known Allergies  Medications Prior to Admission  Medication Sig Dispense Refill Last Dose  . aspirin EC 81 MG tablet Take 81 mg by mouth daily.   Not Taking  . famotidine (PEPCID) 40 MG tablet Take 1 tablet (40 mg total) by mouth daily. (Patient not taking: Reported on 01/24/2017) 30 tablet 2 Not Taking at Unknown time  . flintstones complete (FLINTSTONES) 60 MG chewable tablet Chew 2 tablets by mouth daily.   Not Taking  . ibuprofen (ADVIL,MOTRIN) 600 MG tablet Take 1 tablet (600 mg total) by mouth every 6 (six) hours as  needed. 40 tablet 0 Taking  . Melatonin 3 MG TABS Take 3 mg by mouth at bedtime as needed (sleep).   Not Taking  . phenazopyridine (PYRIDIUM) 200 MG tablet Take 1 tablet (200 mg total) by mouth 3 (three) times daily. (Patient not taking: Reported on 08/25/2017) 12 tablet 0 Not Taking  . promethazine (PHENERGAN) 12.5 MG tablet Take 12.5 mg by mouth every 6 (six) hours as needed for nausea or vomiting.   Not Taking   Results for orders placed or performed during the hospital encounter of 10/17/17 (from the past 48 hour(s))  Urinalysis, Routine w reflex microscopic     Status: None   Collection Time: 10/17/17  2:59 PM  Result Value Ref Range   Color, Urine YELLOW YELLOW   APPearance CLEAR CLEAR   Specific Gravity, Urine 1.025 1.005 - 1.030   pH 5.5 5.0 - 8.0   Glucose, UA NEGATIVE NEGATIVE mg/dL   Hgb urine dipstick NEGATIVE NEGATIVE   Bilirubin Urine NEGATIVE NEGATIVE   Ketones, ur NEGATIVE NEGATIVE mg/dL   Protein, ur NEGATIVE NEGATIVE mg/dL   Nitrite NEGATIVE NEGATIVE   Leukocytes, UA NEGATIVE NEGATIVE    Comment:  Microscopic not done on urines with negative protein, blood, leukocytes, nitrite, or glucose < 500 mg/dL. Performed at [facility], [address] [place], [place] [address]   Pregnancy, urine POC     Status: Abnormal   Collection Time: 10/17/17  3:01 PM  Result Value Ref Range   Preg Test, Ur POSITIVE (A) NEGATIVE    Comment:        THE SENSITIVITY OF THIS METHODOLOGY IS >24 mIU/mL   Pregnancy, urine POC     Status: Abnormal   Collection Time: 10/17/17  3:09 PM  Result Value Ref Range   Preg Test, Ur POSITIVE (A) NEGATIVE    Comment:        THE SENSITIVITY OF THIS METHODOLOGY IS >24 mIU/mL   hCG, quantitative, pregnancy     Status: Abnormal   Collection Time: 10/17/17  3:53 PM  Result Value Ref Range   hCG, Beta Chain, Quant, S 134 (H) <5 mIU/mL    Comment:          GEST. AGE      CONC.  (mIU/mL)   <=1 WEEK        5 - 50     2 WEEKS       50 - 500     3 WEEKS       100 - 10,000     4 WEEKS     1,000 - 30,000     5 WEEKS     3,500 - 115,000   6-8 WEEKS     12,000 - 270,000    12 WEEKS     15,000 - 220,000        FEMALE AND NON-PREGNANT FEMALE:     LESS THAN 5 mIU/mL Performed at [facility], [address] [place], [place] [address]    [place] Ob Transvaginal  Result Date: 10/17/2017 CLINICAL DATA:  Abdominal pain.  Pregnancy. EXAM: TRANSVAGINAL OB ULTRASOUND TECHNIQUE: Transvaginal ultrasound was performed for complete evaluation of the gestation as well as the maternal uterus, adnexal regions, and pelvic cul-de-sac. COMPARISON:  None. FINDINGS: Intrauterine gestational sac: None Maternal uterus/adnexae: Possible corpus luteum cyst in the left ovary. The ovaries are otherwise normal. Trace fluid within the endometrial canal. IMPRESSION: 1. No IUP identified. In the setting of a positive beta HCG, this could be  seen with ectopic pregnancy, recent miscarriage, or early pregnancy. Recommend clinical correlation and close attention on follow-up. Electronically Signed   By:  Gerome Sam III M.D   On: 10/17/2017 18:13    Review of Systems  Constitutional: Negative for fever.  Gastrointestinal: Negative for abdominal pain, nausea and vomiting.  Genitourinary: Negative for dysuria and vaginal bleeding.   Physical Exam   Blood pressure 128/65, pulse 82, temperature 99.5 F (37.5 C), temperature source Oral, resp. rate 18, height 5' 7.5" (1.715 m), weight 170 lb (77.1 kg), last menstrual period 09/19/2017, SpO2 100 %, unknown if currently breastfeeding.  Physical Exam  Constitutional: She is oriented to person, place, and time. She appears well-developed and well-nourished. No distress.  HENT:  Head: Normocephalic.  Respiratory: Effort normal.  GI: Soft. She exhibits no distension and no mass. There is no tenderness. There is no rebound and no guarding.  Musculoskeletal: Normal range of motion.  Neurological: She is alert and oriented to person, place, and time.  Skin: Skin is warm. She is not diaphoretic.  Psychiatric: Her behavior is normal.   MAU Course  Procedures  MDM  Quant in April dropped from 6,000 to 8. Patient never followed up after that. Discussed that this is likely a new pregnancy.  Report given to Cleone Slim CNM who resumes care of the patient Rasch, Harolyn Rutherford, NP 10/17/2017 6:15 PM  Results reviewed with patient. Patient verbalizes understanding and will return to clinic at 0900 on Monday for a repeat HCG.  Assessment and Plan   1. Pregnancy of unknown anatomic location   2. Abdominal pain in pregnancy   3. [redacted] weeks gestation of pregnancy    -Discharge home in stable condition -Strict ectopic precautions discussed -Patient advised to follow-up with [facility] on Monday for repeat blood work  -Patient may return to MAU as needed or if her condition were to change or worsen  Rolm Bookbinder, [place] 10/17/17 6:31 PM

## 2017-10-17 NOTE — MAU Note (Signed)
Pt presents with having miscarriage in April.  Reports was supposed to have f/u HCG level, missed appt. Pt has had 3 +HPT, unsure if new pregancy. Pt requesting birth control if not pregant.

## 2017-10-20 ENCOUNTER — Encounter: Payer: Self-pay | Admitting: Obstetrics and Gynecology

## 2017-10-20 ENCOUNTER — Ambulatory Visit: Payer: Medicaid Other | Admitting: General Practice

## 2017-10-20 DIAGNOSIS — O3680X0 Pregnancy with inconclusive fetal viability, not applicable or unspecified: Secondary | ICD-10-CM

## 2017-10-20 LAB — HCG, QUANTITATIVE, PREGNANCY: hCG, Beta Chain, Quant, S: 618 m[IU]/mL — ABNORMAL HIGH (ref <5)

## 2017-10-20 NOTE — Progress Notes (Signed)
I have reviewed the chart and agree with nursing staff's documentation of this patient's encounter.  Thressa ShellerHeather Tamiki Kuba, CNM 10/20/2017 11:17 AM

## 2017-10-20 NOTE — Progress Notes (Signed)
Patient presents to office today for stat bhcg. Patient denies pain or bleeding. Discussed with patient we are monitoring her beta hcg levels today and asked she wait in lobby for results/updated plan of care. Patient verbalized understanding to all & had no questions at this time.  Reviewed results with Thressa ShellerHeather Hogan who finds appropriate rise in bhcg levels, patient should have follow up ultrasound in 10 days. Scheduled for 6/28.  Informed patient of results & scheduled ultrasound appt. Ectopic precautions reviewed. Patient verbalized understanding & had no questions.

## 2017-10-31 ENCOUNTER — Ambulatory Visit (HOSPITAL_COMMUNITY)
Admission: RE | Admit: 2017-10-31 | Discharge: 2017-10-31 | Disposition: A | Discharge status: Home / Self Care | Payer: Medicaid Other | Source: Ambulatory Visit | Attending: Advanced Practice Midwife | Admitting: Advanced Practice Midwife

## 2017-10-31 ENCOUNTER — Ambulatory Visit (INDEPENDENT_AMBULATORY_CARE_PROVIDER_SITE_OTHER): Payer: Medicaid Other | Admitting: General Practice

## 2017-10-31 DIAGNOSIS — O283 Abnormal ultrasonic finding on antenatal screening of mother: Secondary | ICD-10-CM | POA: Insufficient documentation

## 2017-10-31 DIAGNOSIS — O3680X0 Pregnancy with inconclusive fetal viability, not applicable or unspecified: Secondary | ICD-10-CM

## 2017-10-31 DIAGNOSIS — Z712 Person consulting for explanation of examination or test findings: Secondary | ICD-10-CM

## 2017-10-31 DIAGNOSIS — Z3A01 Less than 8 weeks gestation of pregnancy: Secondary | ICD-10-CM

## 2017-10-31 MED ORDER — PRENATAL VITAMINS 0.8 MG PO TABS: 1.0000 | ORAL_TABLET | Freq: Every day | ORAL | 12 refills | Status: DC

## 2017-10-31 NOTE — Progress Notes (Signed)
Patient presents to office today for viability ultrasound results. Reviewed results with Dr Alysia PennaErvin who finds single living IUP, patient should begin PNV & start OB care.   Informed patient of results, reviewed dating, & provided pictures. Patient requests Rx for PNV. Rx sent & encouraged her to start OB care. Patient verbalized understanding to all & had no questions.

## 2017-11-18 ENCOUNTER — Emergency Department
Admission: EM | Admit: 2017-11-18 | Discharge: 2017-11-18 | Disposition: A | Discharge status: Home / Self Care | Payer: Medicaid Other | Attending: Emergency Medicine | Admitting: Emergency Medicine

## 2017-11-18 ENCOUNTER — Encounter: Payer: Self-pay | Admitting: Emergency Medicine

## 2017-11-18 DIAGNOSIS — K029 Dental caries, unspecified: Secondary | ICD-10-CM | POA: Diagnosis not present

## 2017-11-18 DIAGNOSIS — K0889 Other specified disorders of teeth and supporting structures: Secondary | ICD-10-CM

## 2017-11-18 DIAGNOSIS — Z87891 Personal history of nicotine dependence: Secondary | ICD-10-CM | POA: Diagnosis not present

## 2017-11-18 MED ORDER — AMOXICILLIN 500 MG PO CAPS: 500.0000 mg | ORAL_CAPSULE | Freq: Three times a day (TID) | ORAL | 0 refills | Status: DC

## 2017-11-18 MED ORDER — TRAMADOL HCL 50 MG PO TABS: 50.0000 mg | ORAL_TABLET | Freq: Four times a day (QID) | ORAL | 0 refills | Status: DC | PRN

## 2017-11-18 MED ORDER — TRAMADOL HCL 50 MG PO TABS
50.0000 mg | ORAL_TABLET | Freq: Once | ORAL | Status: AC
  Administered 2017-11-18: 50 mg via ORAL
  Filled 2017-11-18: qty 1

## 2017-11-18 MED ORDER — AMOXICILLIN 500 MG PO CAPS
500.0000 mg | ORAL_CAPSULE | Freq: Once | ORAL | Status: AC
  Administered 2017-11-18: 500 mg via ORAL
  Filled 2017-11-18: qty 1

## 2017-11-18 NOTE — ED Provider Notes (Signed)
[facility] Emergency Department Provider Note  ____________________________________________   First MD Initiated Contact with Patient 11/18/17 2228     (approximate)  I have reviewed the triage vital signs and the nursing notes.   HISTORY  Chief Complaint Dental Pain    HPI Rudie MeyerKelly Michaux is a 27 y.o. female presents emergency department complaining of left-sided dental pain.  States the upper and the lower teeth have cavities and that both teeth have been hurting.  She tried to put in a temporary filling into the upper tooth but the lower tooth continues to hurt.  She denies chest pain, shortness of breath, fever or chills.  Past Medical History:  Diagnosis Date  . Abnormal Pap smear   . Anemia    1st pregnancy, tx'd with iron  . Chlamydia   . History of recurrent UTIs last one 2 months ago  . Prior pregnancy complicated by PIH, antepartum    during 1st pregnancy  . Subchorionic hemorrhage 10/22/10  . Vaginal Pap smear, abnormal     Patient Active Problem List   Diagnosis Date Noted  . Constipation 03/02/2017  . SVD (spontaneous vaginal delivery) 02/24/2017  . Gestational hypertension 02/22/2017  . Chronic hypertension during pregnancy, antepartum 02/16/2017  . Hx of preeclampsia, prior pregnancy, currently pregnant 08/19/2016  . Encounter for supervision of high risk pregnancy in second trimester, antepartum 03/21/2011  . Gestational hypertension w/o significant proteinuria in 3rd trimester 03/21/2011    Past Surgical History:  Procedure Laterality Date  . DILATION AND CURETTAGE OF UTERUS    . INDUCED ABORTION     2010, 2011    Prior to Admission medications   Medication Sig Start Date End Date Taking? Authorizing Provider  amoxicillin (AMOXIL) 500 MG capsule Take 1 capsule (500 mg total) by mouth 3 (three) times daily. 11/18/17   Fisher, Roselyn BeringSusan W, PA-C  famotidine (PEPCID) 40 MG tablet Take 1 tablet (40 mg total) by mouth  daily. Patient not taking: Reported on 01/24/2017 01/10/17   Adam PhenixArnold, James G, MD  Prenatal Multivit-Min-Fe-FA (PRENATAL VITAMINS) 0.8 MG tablet Take 1 tablet by mouth daily. 10/31/17   Hermina StaggersErvin, Michael L, MD  traMADol (ULTRAM) 50 MG tablet Take 1 tablet (50 mg total) by mouth every 6 (six) hours as needed. 11/18/17   Faythe GheeFisher, Susan W, PA-C    Allergies Patient has no known allergies.  Family History  Problem Relation Age of Onset  . Heart disease Maternal Grandmother   . Liver disease Maternal Grandmother   . Heart disease Maternal Grandfather   . Liver disease Maternal Grandfather   . Schizophrenia Paternal Grandmother   . Diabetes Paternal Grandmother   . Mental illness Paternal Grandmother   . Hypertension Father   . Kidney disease Daughter        horse shoe kidneys recurrent UTI  . Hypertension Mother   . Multiple sclerosis Mother   . Anesthesia problems Neg Hx     Social History Social History   Tobacco Use  . Smoking status: Former Smoker    Packs/day: 1.00    Last attempt to quit: 01/20/2016    Years since quitting: 1.8  . Smokeless tobacco: Never Used  Substance Use Topics  . Alcohol use: No  . Drug use: No    Review of Systems  Constitutional: No fever/chills Eyes: No visual changes. ENT: No sore throat.  Positive dental pain Respiratory: Denies cough Genitourinary: Negative for dysuria. Musculoskeletal: Negative for back pain. Skin: Negative for rash.  ____________________________________________   PHYSICAL EXAM:  VITAL SIGNS: ED Triage Vitals  Enc Vitals Group     BP 11/18/17 2207 130/75     Pulse Rate 11/18/17 2207 69     Resp 11/18/17 2207 17     Temp 11/18/17 2207 98.6 F (37 C)     Temp Source 11/18/17 2207 Oral     SpO2 11/18/17 2207 100 %     Weight 11/18/17 2208 170 lb (77.1 kg)     Height --      Head Circumference --      Peak Flow --      Pain Score --      Pain Loc --      Pain Edu? --      Excl. in GC? --     Constitutional:  Alert and oriented. Well appearing and in no acute distress. Eyes: Conjunctivae are normal.  Head: Atraumatic. Nose: No congestion/rhinnorhea. Mouth/Throat: Mucous membranes are moist.  Positive for dental caries in the left lower molars and left upper molars Cardiovascular: Normal rate, regular rhythm.  Heart sounds are normal Respiratory: Normal respiratory effort.  No retractions, lungs clear to auscultation GU: deferred Musculoskeletal: FROM all extremities, warm and well perfused Neurologic:  Normal speech and language.  Skin:  Skin is warm, dry and intact. No rash noted. Psychiatric: Mood and affect are normal. Speech and behavior are normal.  ____________________________________________   LABS (all labs ordered are listed, but only abnormal results are displayed)  Labs Reviewed - No data to display ____________________________________________   ____________________________________________  RADIOLOGY    ____________________________________________   PROCEDURES  Procedure(s) performed: No  Procedures    ____________________________________________   INITIAL IMPRESSION / ASSESSMENT AND PLAN / ED COURSE  Pertinent labs & imaging results that were available during my care of the patient were reviewed by me and considered in my medical decision making (see chart for details).  Patient is a 27 year old female presents emergency department complaining of dental pain.  She does not have a regular dentist and has not seen one in few years.  On physical exam the patient has dental caries in the left upper and lower molars.  There is no swelling noted at the gums that she is afebrile.  Explained the findings to the patient.  Told her she needs to see a dentist.  Suggested Bernestine Amass to have a walk-in dental clinic.  She was given a dose of amoxicillin and tramadol while here in the emergency department.  She was given a prescription for amoxicillin, tramadol and she  is to continue to take ibuprofen.  She states she understands comply with instructions.  She is given a work note for tomorrow so that she may go to [employer].  She was discharged in stable condition     As part of my medical decision making, I reviewed the following data within the electronic MEDICAL RECORD NUMBER Nursing notes reviewed and incorporated, Old chart reviewed, Notes from prior ED visits and [place] Controlled Substance Database  ____________________________________________   FINAL CLINICAL IMPRESSION(S) / ED DIAGNOSES  Final diagnoses:  Pain, dental  Pain due to dental caries      NEW MEDICATIONS STARTED DURING THIS VISIT:  New Prescriptions   AMOXICILLIN (AMOXIL) 500 MG CAPSULE    Take 1 capsule (500 mg total) by mouth 3 (three) times daily.   TRAMADOL (ULTRAM) 50 MG TABLET    Take 1 tablet (50 mg total) by mouth every 6 (six) hours  as needed.     Note:  This document was prepared using Dragon voice recognition software and may include unintentional dictation errors.    Faythe Ghee, PA-C 11/18/17 2239    Phineas Semen, MD 11/19/17 [phone]

## 2017-11-18 NOTE — ED Triage Notes (Signed)
Pt c/o left upper and lower back dental pain where decay is seen on assessment.

## 2017-11-18 NOTE — Discharge Instructions (Addendum)
Follow-up with your regular doctor if not better 5 -7 days.  Use medication as prescribed.  [facility] they do have a walk-in clinic.

## 2018-08-23 IMAGING — US US OB COMP LESS 14 WK
1 series · 15 of 28 positions shown · non-contrast
Comparison: None.

CLINICAL DATA: Pelvic pain for 1 month in first trimester
pregnancy. Gestational age by LMP of 6 weeks 2 days.

EXAM:
OBSTETRIC <14 WK US AND TRANSVAGINAL OB US
TECHNIQUE: Both transabdominal and transvaginal ultrasound examinations were
performed for complete evaluation of the gestation as well as the
maternal uterus, adnexal regions, and pelvic cul-de-sac.
Transvaginal technique was performed to assess early pregnancy.

[Series 1: us ob comp less 14 wk · 15 of 58 slices shown]
[im 1/58]
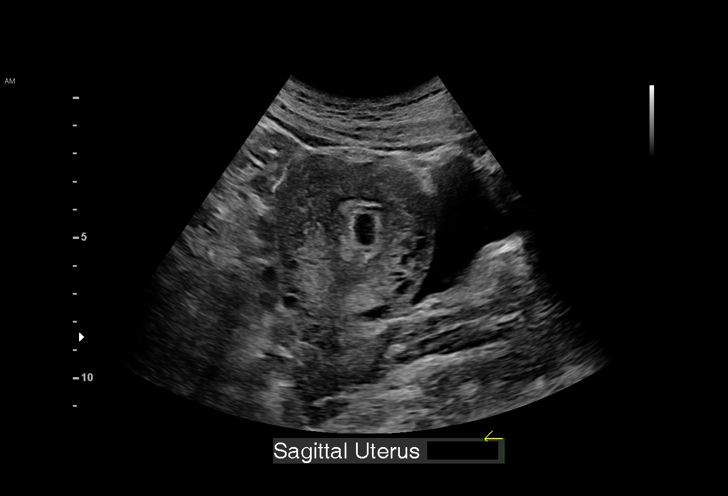
[im 5/58]
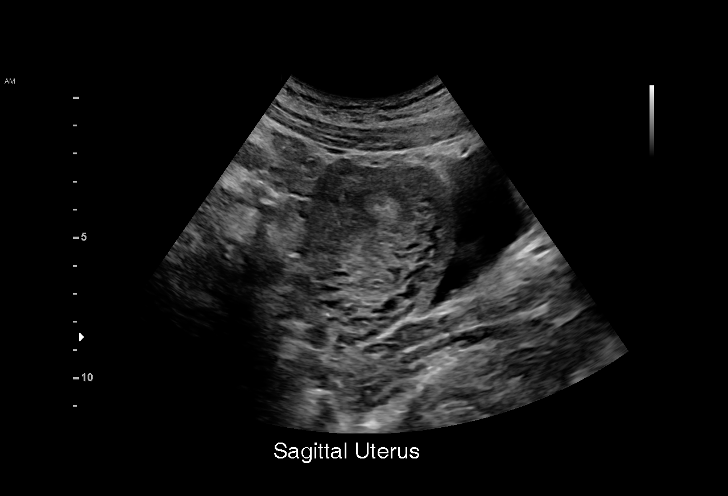
[im 9/58]
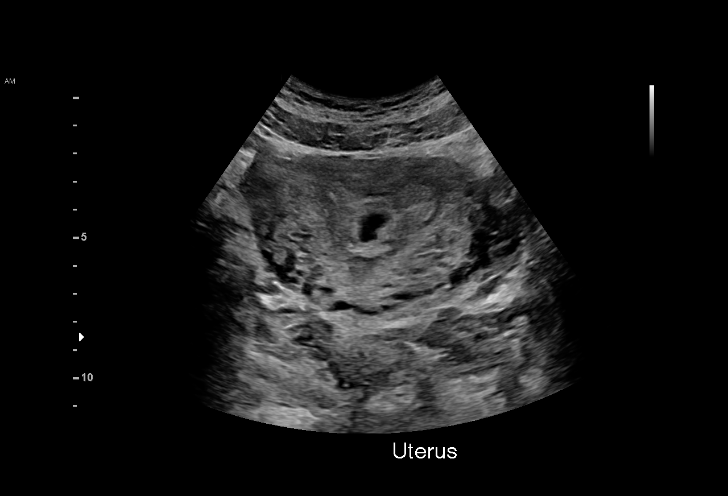
[im 13/58]
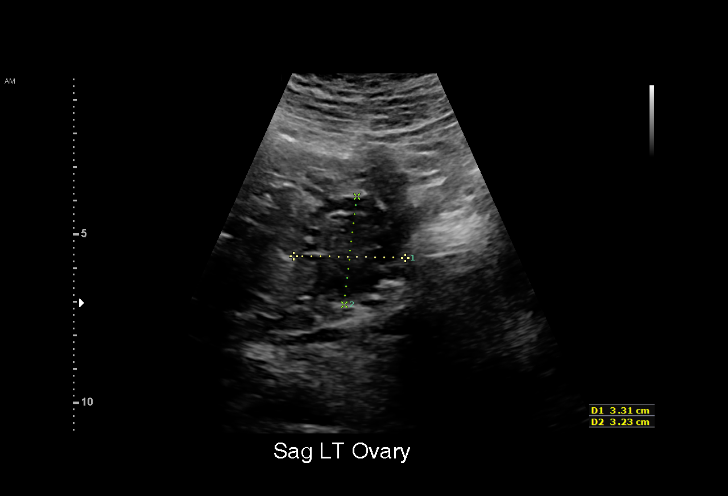
[im 17/58]
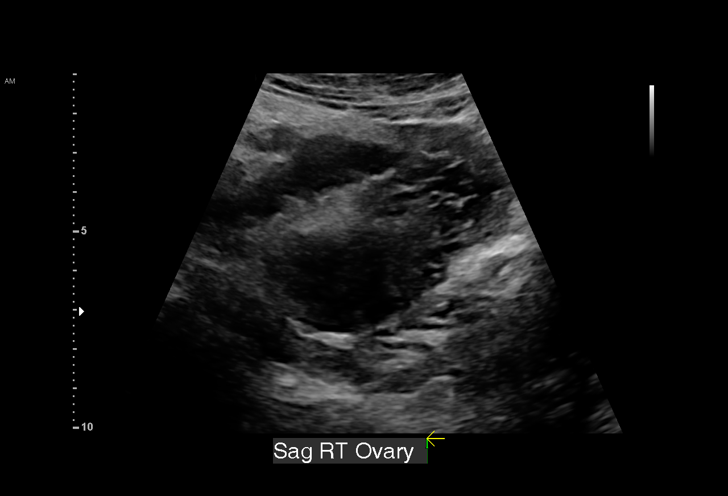
[im 22/58]
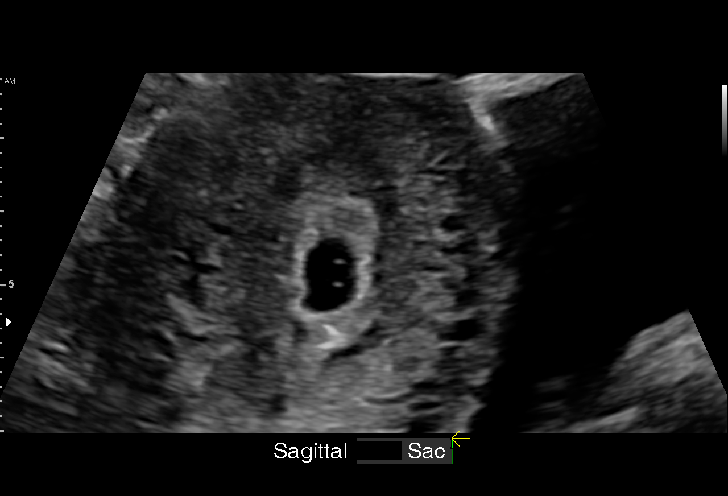
[im 26/58]
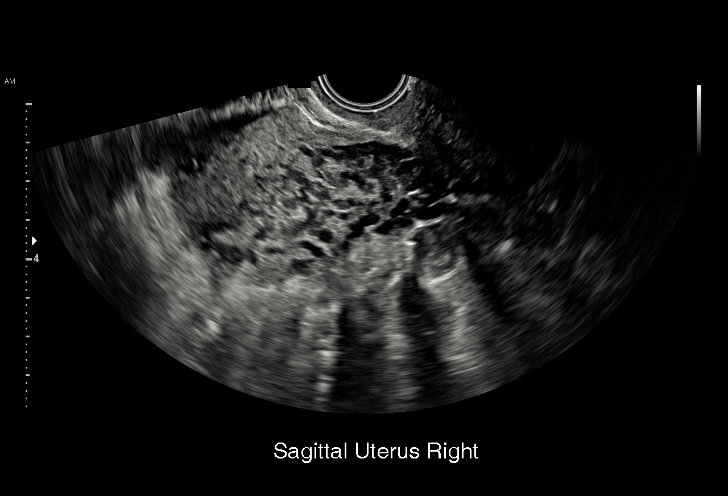
[im 30/58]
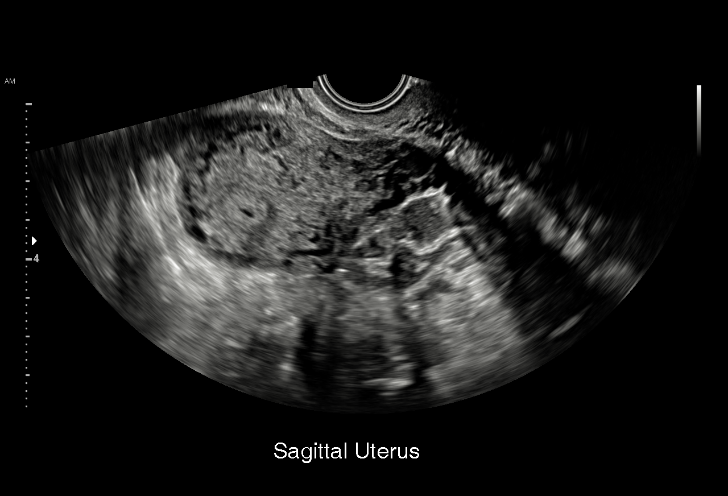
[im 32/58]
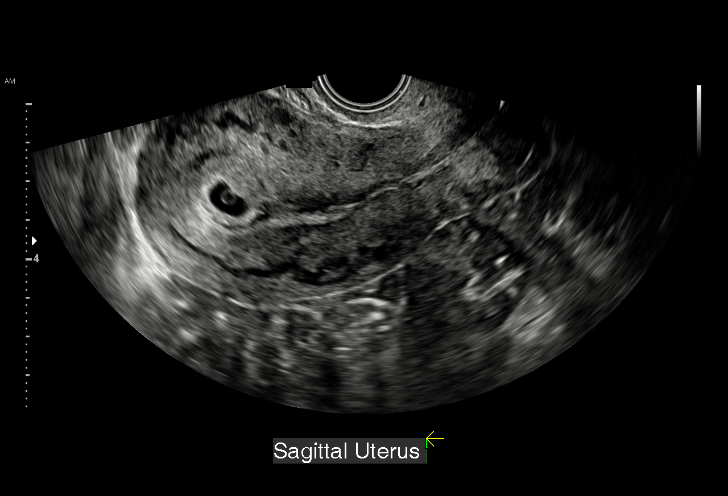
[im 36/58]
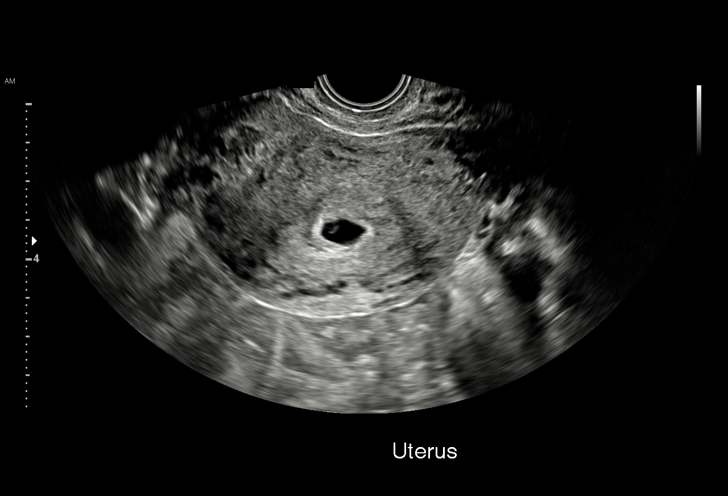
[im 41/58]
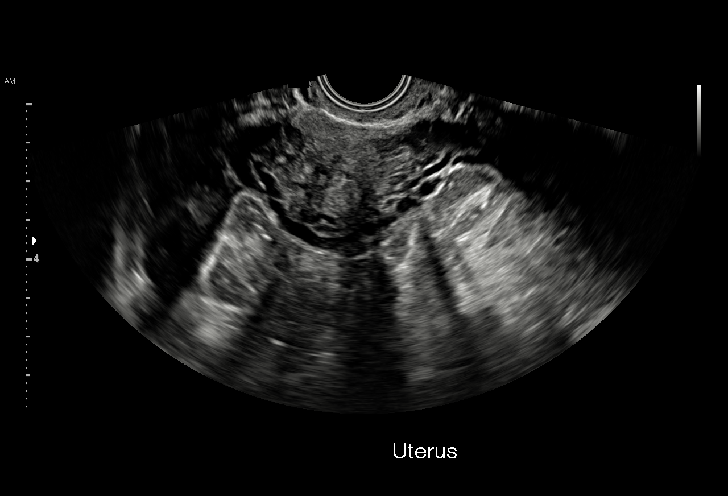
[im 45/58]
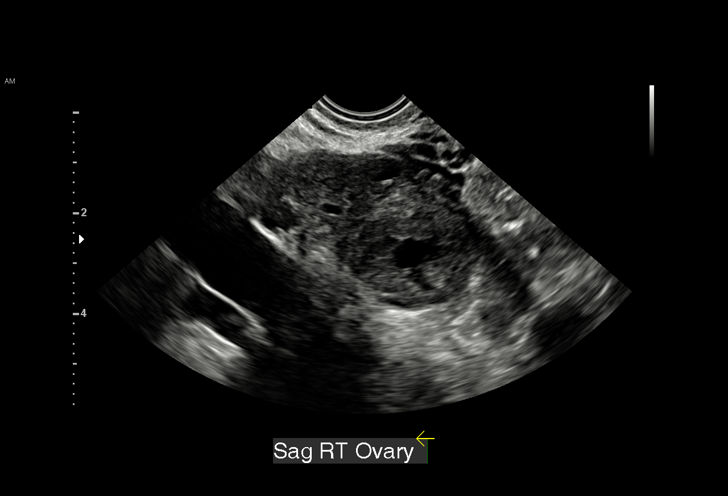
[im 49/58]
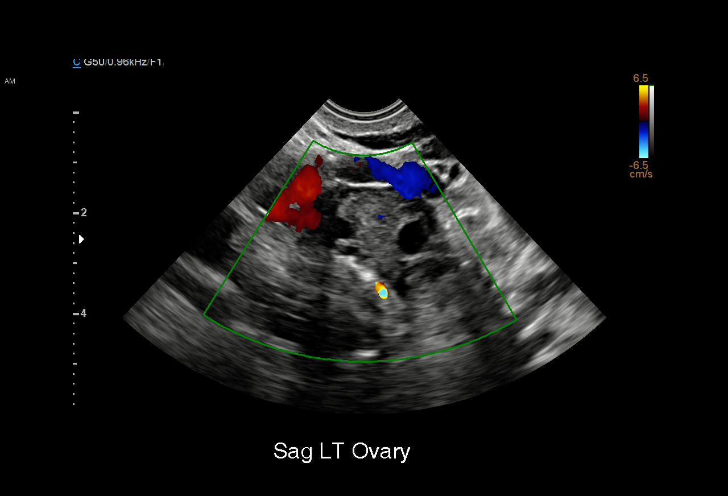
[im 53/58]
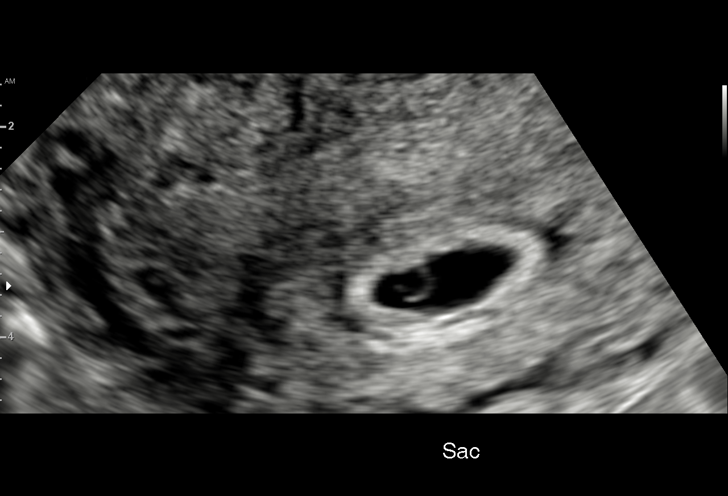
[im 58/58]
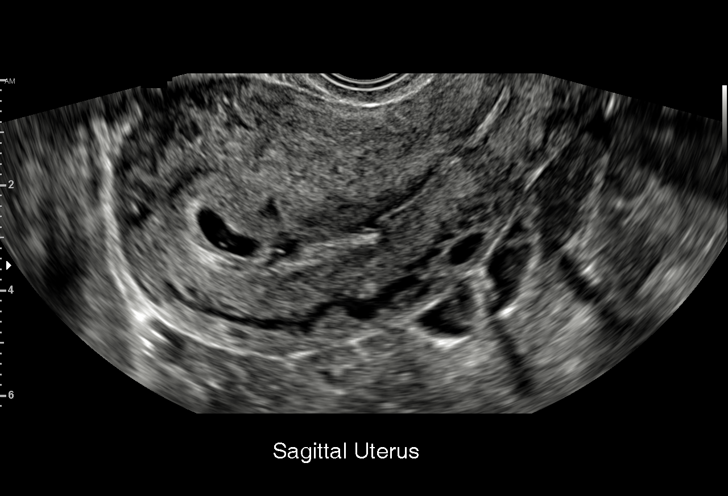

[15 of 28 positions shown; findings below may reference images not displayed]

FINDINGS: Intrauterine gestational sac: Single

Yolk sac:  Visualized

Embryo:  None visualized

MSD: 10  mm   5 w   5  d

Subchorionic hemorrhage:  None visualized.

Maternal uterus/adnexae: Small right ovarian corpus luteum noted.
Normal appearance of left ovary. No mass or free fluid identified
IMPRESSION: Single intrauterine gestational sac measuring 5 weeks 5 days by mean
sac diameter. Recommend followup of quantitative beta HCG levels,
and consider followup ultrasound to assess viability in 10-14 days.

No significant maternal uterine or adnexal abnormality identified.

## 2019-02-05 IMAGING — US US OB COMP LESS 14 WK
1 series · 15 of 28 positions shown · non-contrast
Comparison: None.

CLINICAL DATA: Left-sided abdominal pain for 1 month. Quantitative
HCG 679.

EXAM:
OBSTETRIC <14 WK US AND TRANSVAGINAL OB US
TECHNIQUE: Both transabdominal and transvaginal ultrasound examinations were
performed for complete evaluation of the gestation as well as the
maternal uterus, adnexal regions, and pelvic cul-de-sac.
Transvaginal technique was performed to assess early pregnancy.

[Series 1: us ob comp less 14 wk · 15 of 59 slices shown]
[im 1/59]
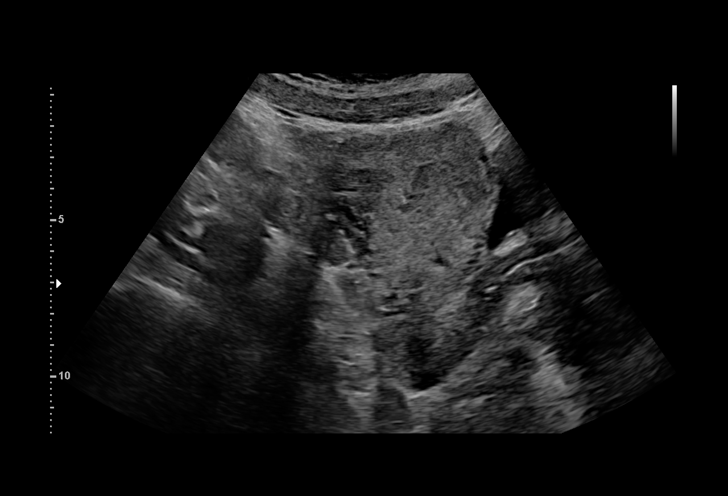
[im 5/59]
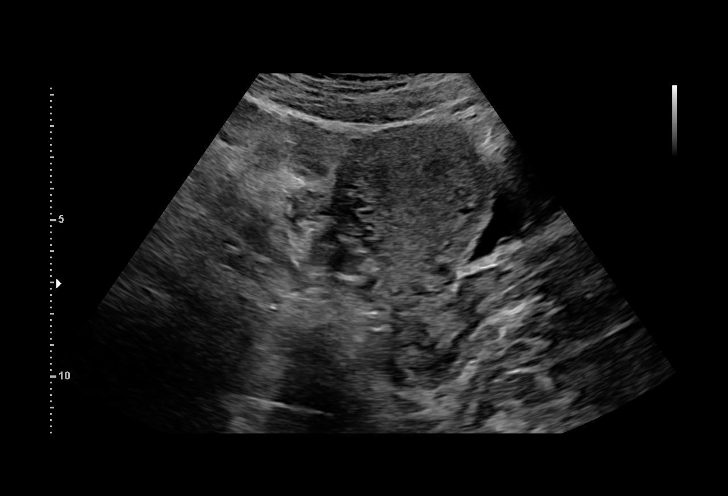
[im 9/59]
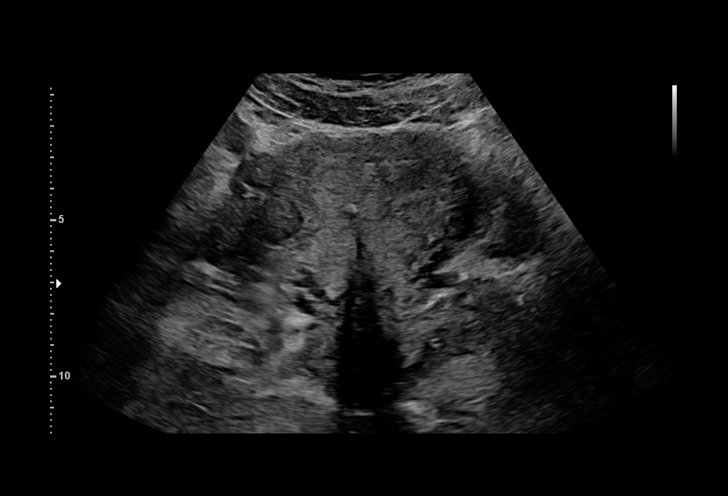
[im 13/59]
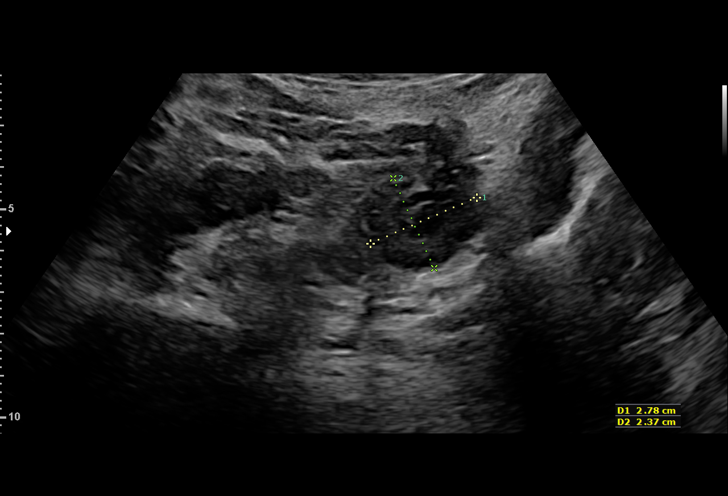
[im 18/59]
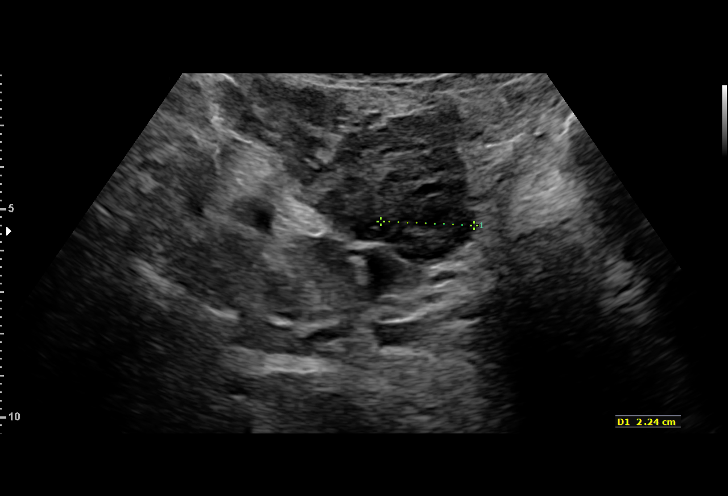
[im 22/59]
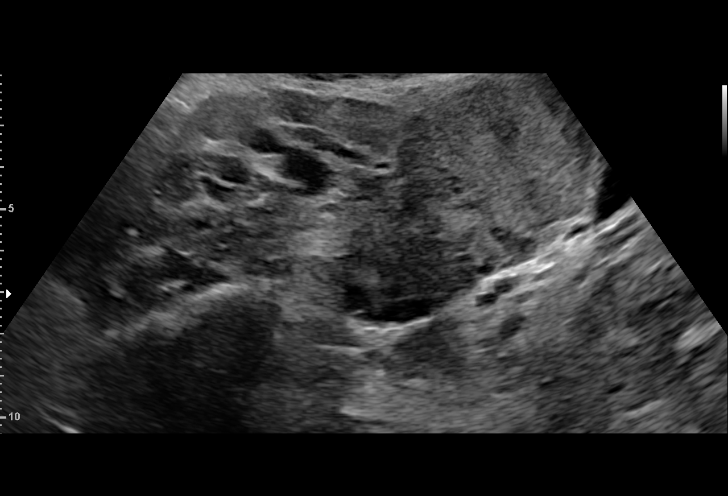
[im 26/59]
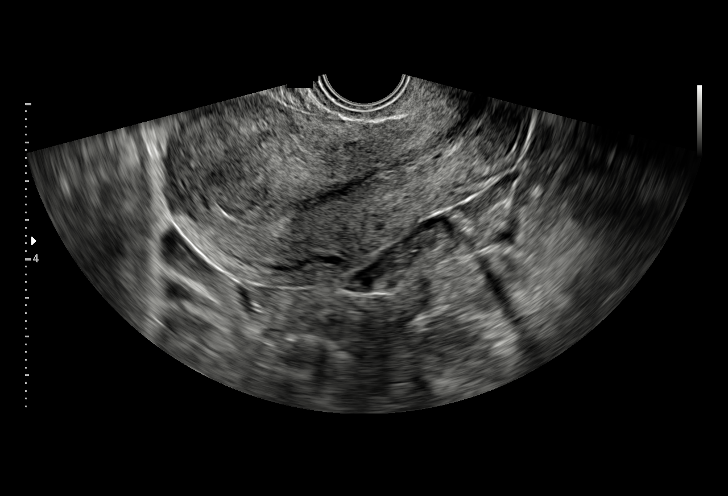
[im 31/59]
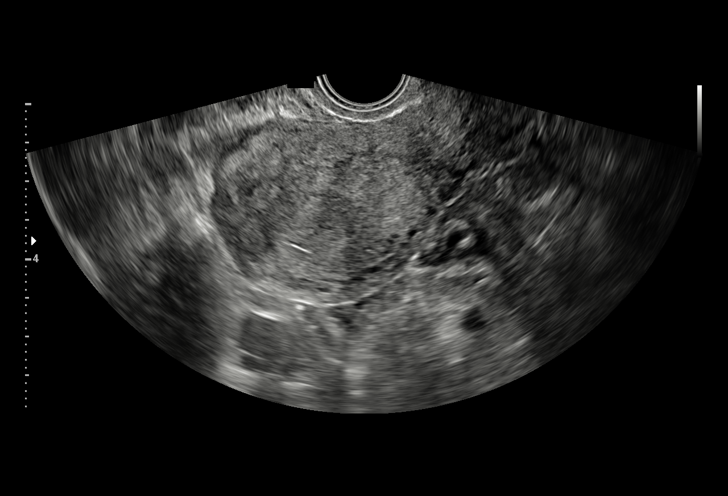
[im 33/59]
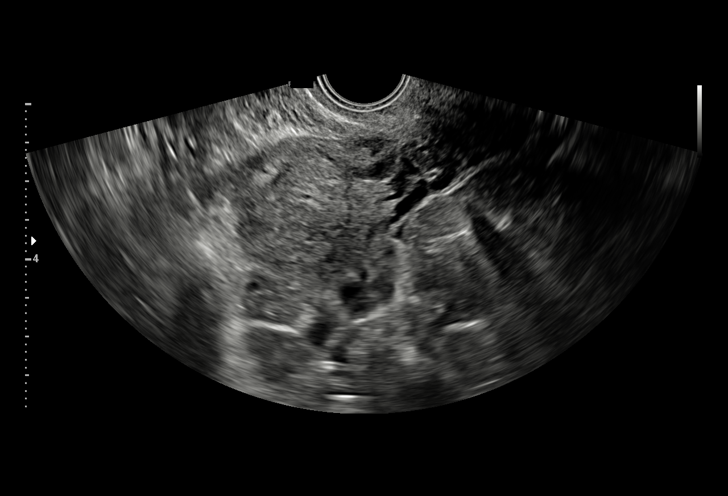
[im 37/59]
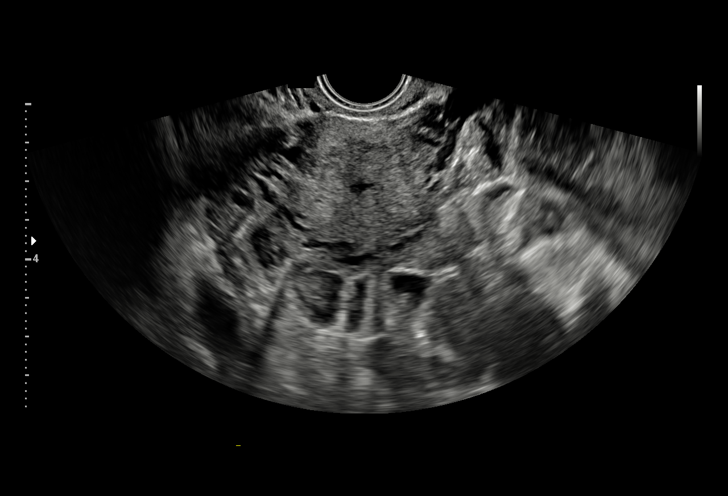
[im 41/59]
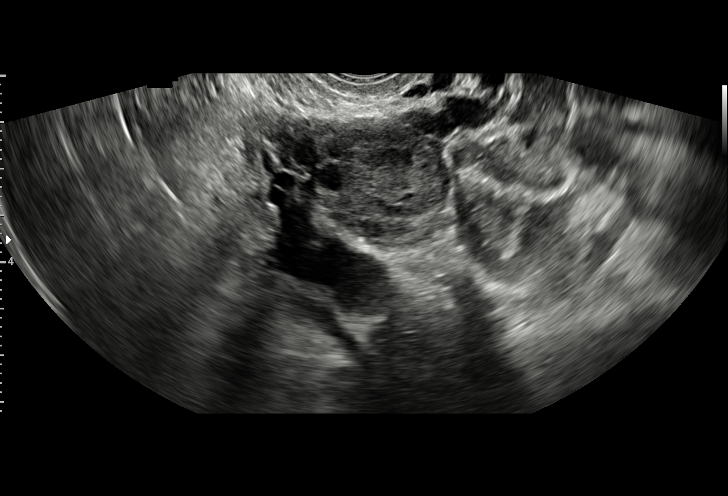
[im 46/59]
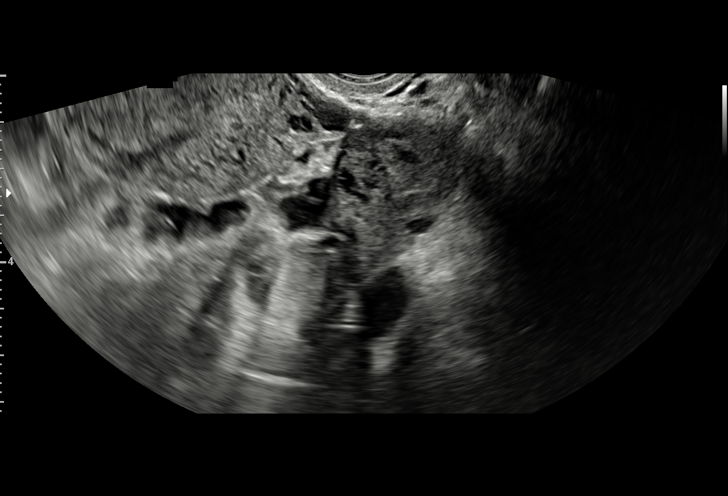
[im 50/59]
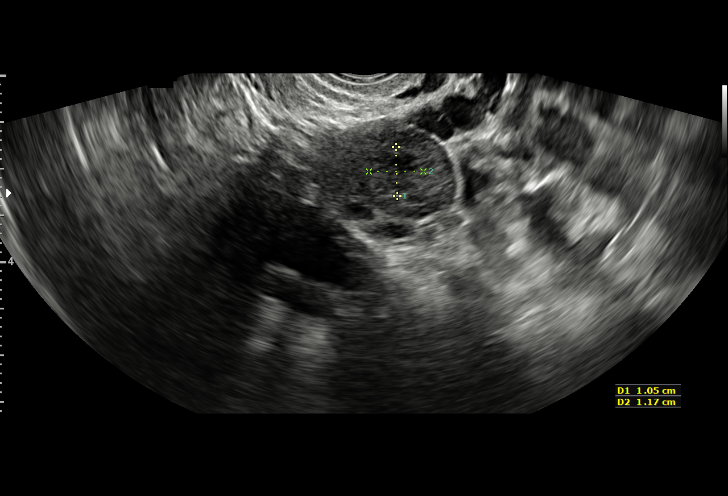
[im 54/59]
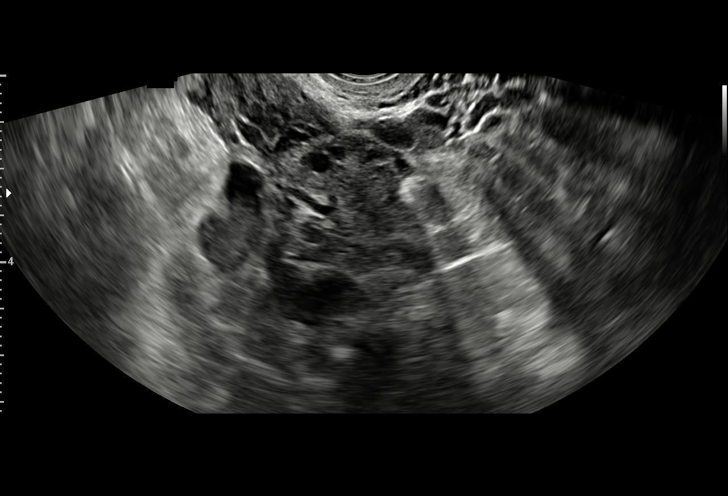
[im 59/59]
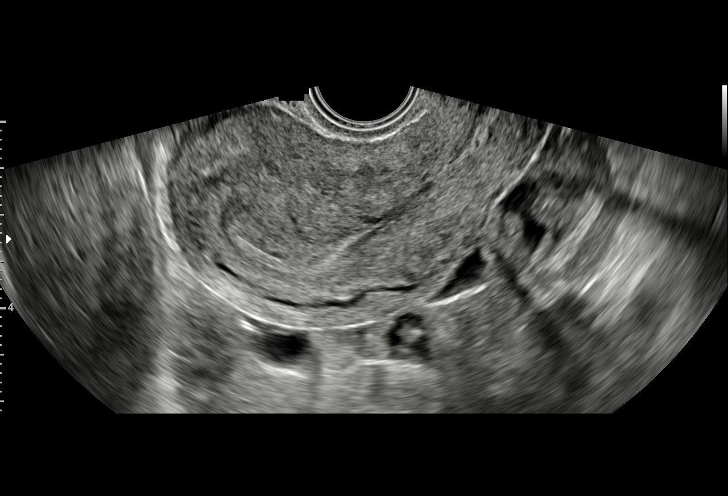

[15 of 28 positions shown; findings below may reference images not displayed]

FINDINGS: Intrauterine gestational sac: Not visible

Yolk sac:  Not visible

Embryo:  Not visible

Cardiac Activity: Not visible

Heart Rate:   bpm

MSD:   mm    w     d

CRL:    mm    w    d                  US EDC:

Subchorionic hemorrhage:  NA

Maternal uterus/adnexae: Normal ovaries.  No free pelvic fluid.
IMPRESSION: No visible gestational sac. No adnexal abnormality or abnormal fluid
collection. Recommend follow-up quantitative B-HCG levels and
follow-up US to assess viability.

## 2020-03-07 ENCOUNTER — Other Ambulatory Visit: Payer: Self-pay | Admitting: Family Medicine

## 2020-03-07 DIAGNOSIS — R102 Pelvic and perineal pain: Secondary | ICD-10-CM

## 2020-03-07 DIAGNOSIS — N946 Dysmenorrhea, unspecified: Secondary | ICD-10-CM

## 2020-03-08 ENCOUNTER — Encounter (HOSPITAL_COMMUNITY): Payer: Self-pay | Admitting: Obstetrics & Gynecology

## 2020-03-08 ENCOUNTER — Inpatient Hospital Stay (HOSPITAL_COMMUNITY)
Admission: AD | Admit: 2020-03-08 | Discharge: 2020-03-08 | Disposition: A | Discharge status: Home / Self Care | Payer: Medicaid Other | Attending: Obstetrics & Gynecology | Admitting: Obstetrics & Gynecology

## 2020-03-08 ENCOUNTER — Other Ambulatory Visit: Payer: Self-pay

## 2020-03-08 DIAGNOSIS — K581 Irritable bowel syndrome with constipation: Secondary | ICD-10-CM

## 2020-03-08 DIAGNOSIS — R102 Pelvic and perineal pain: Secondary | ICD-10-CM | POA: Diagnosis not present

## 2020-03-08 DIAGNOSIS — R1032 Left lower quadrant pain: Secondary | ICD-10-CM | POA: Diagnosis not present

## 2020-03-08 DIAGNOSIS — O99891 Other specified diseases and conditions complicating pregnancy: Secondary | ICD-10-CM | POA: Diagnosis not present

## 2020-03-08 DIAGNOSIS — Z3A00 Weeks of gestation of pregnancy not specified: Secondary | ICD-10-CM

## 2020-03-08 DIAGNOSIS — R350 Frequency of micturition: Secondary | ICD-10-CM | POA: Diagnosis not present

## 2020-03-08 DIAGNOSIS — M545 Low back pain, unspecified: Secondary | ICD-10-CM | POA: Insufficient documentation

## 2020-03-08 DIAGNOSIS — Z87891 Personal history of nicotine dependence: Secondary | ICD-10-CM | POA: Diagnosis not present

## 2020-03-08 LAB — CBC WITH DIFFERENTIAL/PLATELET
Abs Immature Granulocytes: 0.02 10*3/uL (ref 0.00–0.07)
Basophils Absolute: 0 10*3/uL (ref 0.0–0.1)
Basophils Relative: 0 %
Eosinophils Absolute: 0.1 10*3/uL (ref 0.0–0.5)
Eosinophils Relative: 1 %
HCT: 34.3 % — ABNORMAL LOW (ref 36.0–46.0)
Hemoglobin: 11.4 g/dL — ABNORMAL LOW (ref 12.0–15.0)
Immature Granulocytes: 0 %
Lymphocytes Relative: 39 %
Lymphs Abs: 2.7 10*3/uL (ref 0.7–4.0)
MCH: 30.5 pg (ref 26.0–34.0)
MCHC: 33.2 g/dL (ref 30.0–36.0)
MCV: 91.7 fL (ref 80.0–100.0)
Monocytes Absolute: 0.5 10*3/uL (ref 0.1–1.0)
Monocytes Relative: 7 %
Neutro Abs: 3.6 10*3/uL (ref 1.7–7.7)
Neutrophils Relative %: 53 %
Platelets: 223 10*3/uL (ref 150–400)
RBC: 3.74 MIL/uL — ABNORMAL LOW (ref 3.87–5.11)
RDW: 11.5 % (ref 11.5–15.5)
WBC: 6.9 10*3/uL (ref 4.0–10.5)
nRBC: 0 % (ref 0.0–0.2)

## 2020-03-08 LAB — URINALYSIS, ROUTINE W REFLEX MICROSCOPIC
Bilirubin Urine: NEGATIVE
Glucose, UA: NEGATIVE mg/dL
Hgb urine dipstick: NEGATIVE
Ketones, ur: NEGATIVE mg/dL
Leukocytes,Ua: NEGATIVE
Nitrite: NEGATIVE
Protein, ur: NEGATIVE mg/dL
Specific Gravity, Urine: 1.023 (ref 1.005–1.030)
pH: 8 (ref 5.0–8.0)

## 2020-03-08 LAB — COMPREHENSIVE METABOLIC PANEL
ALT: 9 U/L (ref 0–44)
AST: 12 U/L — ABNORMAL LOW (ref 15–41)
Albumin: 3.6 g/dL (ref 3.5–5.0)
Alkaline Phosphatase: 50 U/L (ref 38–126)
Anion gap: 7 (ref 5–15)
BUN: 10 mg/dL (ref 6–20)
CO2: 25 mmol/L (ref 22–32)
Calcium: 8.6 mg/dL — ABNORMAL LOW (ref 8.9–10.3)
Chloride: 106 mmol/L (ref 98–111)
Creatinine, Ser: 0.63 mg/dL (ref 0.44–1.00)
GFR, Estimated: >60 mL/min (ref >60)
Glucose, Bld: 88 mg/dL (ref 70–99)
Potassium: 3.6 mmol/L (ref 3.5–5.1)
Sodium: 138 mmol/L (ref 135–145)
Total Bilirubin: 0.4 mg/dL (ref 0.3–1.2)
Total Protein: 6.2 g/dL — ABNORMAL LOW (ref 6.5–8.1)

## 2020-03-08 LAB — WET PREP, GENITAL
Sperm: NONE SEEN
Trich, Wet Prep: NONE SEEN
Yeast Wet Prep HPF POC: NONE SEEN

## 2020-03-08 LAB — POCT PREGNANCY, URINE: Preg Test, Ur: NEGATIVE

## 2020-03-08 LAB — HIV ANTIBODY (ROUTINE TESTING W REFLEX): HIV Screen 4th Generation wRfx: NONREACTIVE

## 2020-03-08 LAB — HCG, SERUM, QUALITATIVE: Preg, Serum: NEGATIVE

## 2020-03-08 NOTE — MAU Note (Signed)
Have had cramping in lower abd and back for several wks-mostly L side. Today I am having cramping in pubic bone area. I was seen at [facility] clinic recently and scheduled for u/s Friday. Pain was so bad I did not want to wait. I also have h/a, breast tenderness, cramping, Have Nexplanon that will come out Nov 2022 and have never had problems. Denies VB or vag d/c. LMP was longer and heavier than normal. LMP was 2 wks ago

## 2020-03-08 NOTE — MAU Provider Note (Signed)
Chief Complaint:  Pelvic Pain   First Provider Initiated Contact with Patient 03/08/20 2055      HPI: Penny Becker is a 29 y.o. [id number] who presents to maternity admissions reporting pain initially in Left lower back which has now moved to suprapubic and lower abdomen.  Nothing makes it worse or better.  Has had heavier menses but no bleeding now.  Was seen in a walkin clinic and they did a "manual" but no cultures.  They set her up for an [place] this Friday. . She reports no vaginal bleeding, vaginal itching/burning, urinary symptoms, h/a, dizziness, n/v, or fever/chills.    Pelvic Pain The patient's primary symptoms include pelvic pain. The patient's pertinent negatives include no genital itching, genital lesions, genital odor or vaginal bleeding. This is a new problem. The current episode started in the past 7 days. The problem occurs intermittently. The problem has been unchanged. The pain is moderate. The problem affects both sides. She is not pregnant. Associated symptoms include abdominal pain and back pain. Pertinent negatives include no chills, constipation (does have intermittent constipation, but none now), diarrhea, fever, frequency, nausea or vomiting. Nothing aggravates the symptoms. She has tried nothing for the symptoms. She is sexually active. It is unknown whether or not her partner has an STD. Contraceptive use: Nexplanon. Her menstrual history has been regular.   RN note: Have had cramping in lower abd and back for several wks-mostly L side. Today I am having cramping in pubic bone area. I was seen at [facility] recently and scheduled for u/s Friday. Pain was so bad I did not want to wait. I also have h/a, breast tenderness, cramping, Have Nexplanon that will come out Nov 2022 and have never had problems. Denies VB or vag d/c. LMP was longer and heavier than normal. LMP was 2 wks ago  Past Medical History: Past Medical History:  Diagnosis Date  . Abnormal Pap smear   .  Anemia    1st pregnancy, tx'd with iron  . Chlamydia   . History of recurrent UTIs last one 2 months ago  . Prior pregnancy complicated by PIH, antepartum    during 1st pregnancy  . Subchorionic hemorrhage 10/22/10  . Vaginal Pap smear, abnormal     Past obstetric history: OB History  Gravida Para Term Preterm AB Living  8 3 3  0 4 3  SAB TAB Ectopic Multiple Live Births  2 2 0 0 3    # Outcome Date GA Lbr Len/2nd Weight Sex Delivery Anes PTL Lv  8 Gravida           7 Term 02/22/17 [redacted]w[redacted]d 22:11 / 00:12 3195 g M Vag-Spont EPI  LIV  6 SAB 02/2016          5 Term 05/27/11 [redacted]w[redacted]d 07:37 / 01:06 3095 g M Vag-Spont EPI  LIV  4 TAB 2011          3 TAB 01/11/09          2 SAB 09/01/08          1 Term 07/11/07 [redacted]w[redacted]d  2835 g F Vag-Spont EPI  LIV     Birth Comments: Induced for preeclampsia    Past Surgical History: Past Surgical History:  Procedure Laterality Date  . DILATION AND CURETTAGE OF UTERUS    . INDUCED ABORTION     2010, 2011    Family History: Family History  Problem Relation Age of Onset  . Heart disease Maternal Grandmother   .  Liver disease Maternal Grandmother   . Heart disease Maternal Grandfather   . Liver disease Maternal Grandfather   . Schizophrenia Paternal Grandmother   . Diabetes Paternal Grandmother   . Mental illness Paternal Grandmother   . Hypertension Father   . Kidney disease Daughter        horse shoe kidneys recurrent UTI  . Hypertension Mother   . Multiple sclerosis Mother   . Anesthesia problems Neg Hx     Social History: Social History   Tobacco Use  . Smoking status: Former Smoker    Packs/day: 1.00    Quit date: 01/20/2016    Years since quitting: 4.1  . Smokeless tobacco: Never Used  Substance Use Topics  . Alcohol use: No  . Drug use: No    Allergies: No Known Allergies  Meds:  Medications Prior to Admission  Medication Sig Dispense Refill Last Dose  . famotidine (PEPCID) 40 MG tablet Take 1 tablet (40 mg total) by mouth  daily. (Patient not taking: Reported on 01/24/2017) 30 tablet 2   . Prenatal Multivit-Min-Fe-FA (PRENATAL VITAMINS) 0.8 MG tablet Take 1 tablet by mouth daily. 30 tablet 12   . traMADol (ULTRAM) 50 MG tablet Take 1 tablet (50 mg total) by mouth every 6 (six) hours as needed. 15 tablet 0     I have reviewed patient's Past Medical Hx, Surgical Hx, Family Hx, Social Hx, medications and allergies.  ROS:  Review of Systems  Constitutional: Negative for chills and fever.  Gastrointestinal: Positive for abdominal pain. Negative for constipation (does have intermittent constipation, but none now), diarrhea, nausea and vomiting.  Genitourinary: Positive for pelvic pain. Negative for frequency.  Musculoskeletal: Positive for back pain.   Other systems negative     Physical Exam   Patient Vitals for the past 24 hrs:  BP Temp Pulse Resp Height Weight  03/08/20 2032 136/76 -- 71 -- -- --  03/08/20 2030 -- 98.3 F (36.8 C) -- 16 5\' 8"  (1.727 m) 79.8 kg   Constitutional: Well-developed, well-nourished female in no acute distress.  Cardiovascular: normal rate and rhythm Respiratory: normal effort, no distress. GI: Abd soft, moderately tender over lower abdominal area just to right of midline.  Nondistended.  Does have slight rebound, No guarding.   MS: Extremities nontender, no edema, normal ROM Neurologic: Alert and oriented x 4.   Grossly nonfocal. GU: Neg CVAT. Skin:  Warm and Dry Psych:  Affect appropriate.  PELVIC EXAM: scant white creamy discharge, vaginal walls and external genitalia normal Bimanual exam: Cervix firm, anterior, neg CMT, uterus nontender, firm and nonenlarged, adnexa without tenderness, enlargement, or mass     Labs: Results for orders placed or performed during the hospital encounter of 03/08/20 (from the past 24 hour(s))  Urinalysis, Routine w reflex microscopic Urine, Clean Catch     Status: Abnormal   Collection Time: 03/08/20  9:05 PM  Result Value Ref Range    Color, Urine YELLOW YELLOW   APPearance CLOUDY (A) CLEAR   Specific Gravity, Urine 1.023 1.005 - 1.030   pH 8.0 5.0 - 8.0   Glucose, UA NEGATIVE NEGATIVE mg/dL   Hgb urine dipstick NEGATIVE NEGATIVE   Bilirubin Urine NEGATIVE NEGATIVE   Ketones, ur NEGATIVE NEGATIVE mg/dL   Protein, ur NEGATIVE NEGATIVE mg/dL   Nitrite NEGATIVE NEGATIVE   Leukocytes,Ua NEGATIVE NEGATIVE   RBC / HPF 0-5 0 - 5 RBC/hpf   WBC, UA 0-5 0 - 5 WBC/hpf   Bacteria, UA RARE (A) NONE SEEN  Squamous Epithelial / LPF 11-20 0 - 5   Mucus PRESENT   Pregnancy, urine POC     Status: None   Collection Time: 03/08/20  9:06 PM  Result Value Ref Range   Preg Test, Ur NEGATIVE NEGATIVE  Wet prep, genital     Status: Abnormal   Collection Time: 03/08/20  9:22 PM   Specimen: Vaginal  Result Value Ref Range   Yeast Wet Prep HPF POC NONE SEEN NONE SEEN   Trich, Wet Prep NONE SEEN NONE SEEN   Clue Cells Wet Prep HPF POC PRESENT (A) NONE SEEN   WBC, Wet Prep HPF POC FEW (A) NONE SEEN   Sperm NONE SEEN   CBC with Differential/Platelet     Status: Abnormal   Collection Time: 03/08/20  9:35 PM  Result Value Ref Range   WBC 6.9 4.0 - 10.5 K/uL   RBC 3.74 (L) 3.87 - 5.11 MIL/uL   Hemoglobin 11.4 (L) 12.0 - 15.0 g/dL   HCT 16.134.3 (L) 36 - 46 %   MCV 91.7 80.0 - 100.0 fL   MCH 30.5 26.0 - 34.0 pg   MCHC 33.2 30.0 - 36.0 g/dL   RDW 09.611.5 04.511.5 - 40.915.5 %   Platelets 223 150 - 400 K/uL   nRBC 0.0 0.0 - 0.2 %   Neutrophils Relative % 53 %   Neutro Abs 3.6 1.7 - 7.7 K/uL   Lymphocytes Relative 39 %   Lymphs Abs 2.7 0.7 - 4.0 K/uL   Monocytes Relative 7 %   Monocytes Absolute 0.5 0.1 - 1.0 K/uL   Eosinophils Relative 1 %   Eosinophils Absolute 0.1 0.0 - 0.5 K/uL   Basophils Relative 0 %   Basophils Absolute 0.0 0.0 - 0.1 K/uL   Immature Granulocytes 0 %   Abs Immature Granulocytes 0.02 0.00 - 0.07 K/uL  Comprehensive metabolic panel     Status: Abnormal   Collection Time: 03/08/20  9:35 PM  Result Value Ref Range   Sodium  138 135 - 145 mmol/L   Potassium 3.6 3.5 - 5.1 mmol/L   Chloride 106 98 - 111 mmol/L   CO2 25 22 - 32 mmol/L   Glucose, Bld 88 70 - 99 mg/dL   BUN 10 6 - 20 mg/dL   Creatinine, Ser 8.110.63 0.44 - 1.00 mg/dL   Calcium 8.6 (L) 8.9 - 10.3 mg/dL   Total Protein 6.2 (L) 6.5 - 8.1 g/dL   Albumin 3.6 3.5 - 5.0 g/dL   AST 12 (L) 15 - 41 U/L   ALT 9 0 - 44 U/L   Alkaline Phosphatase 50 38 - 126 U/L   Total Bilirubin 0.4 0.3 - 1.2 mg/dL   GFR, Estimated >91>60 >47>60 mL/min   Anion gap 7 5 - 15  HIV Antibody (routine testing w rflx)     Status: None   Collection Time: 03/08/20  9:35 PM  Result Value Ref Range   HIV Screen 4th Generation wRfx Non Reactive Non Reactive  hCG, serum, qualitative     Status: None   Collection Time: 03/08/20  9:35 PM  Result Value Ref Range   Preg, Serum NEGATIVE NEGATIVE       Imaging:  No results found.  MAU Course/MDM: I have ordered labs as follows:  See above.  No leukocytosis, normal renal and Liver function tests.  UA is negative.  Imaging ordered: Will have her keep appt for outpatient US Results reviewed.    Treatments in MAU included none except  exam Upon my return to discuss results, pt is eating chips and has no complaints Patient states is relieved there is nothing serious found at this time.  I discussed need for daily fiber and water, miralax prn, Mylicon may help.   Pt stable at time of discharge.  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   Wynelle Bourgeois CNM, MSN Certified Nurse-Midwife 03/08/2020 8:56 PM

## 2020-03-08 NOTE — Discharge Instructions (Signed)
Diet for Irritable Bowel Syndrome When you have irritable bowel syndrome (IBS), it is very important to eat the foods and follow the eating habits that are best for your condition. IBS may cause various symptoms such as pain in the abdomen, constipation, or diarrhea. Choosing the right foods can help to ease the discomfort from these symptoms. Work with your health care provider and diet and nutrition specialist (dietitian) to find the eating plan that will help to control your symptoms. What are tips for following this plan?      Keep a food diary. This will help you identify foods that cause symptoms. Write down: ? What you eat and when you eat it. ? What symptoms you have. ? When symptoms occur in relation to your meals, such as "pain in abdomen 2 hours after dinner."  Eat your meals slowly and in a relaxed setting.  Aim to eat 5-6 small meals per day. Do not skip meals.  Drink enough fluid to keep your urine pale yellow.  Ask your health care provider if you should take an over-the-counter probiotic to help restore healthy bacteria in your gut (digestive tract). ? Probiotics are foods that contain good bacteria and yeasts.  Your dietitian may have specific dietary recommendations for you based on your symptoms. He or she may recommend that you: ? Avoid foods that cause symptoms. Talk with your dietitian about other ways to get the same nutrients that are in those problem foods. ? Avoid foods with gluten. Gluten is a protein that is found in rye, wheat, and barley. ? Eat more foods that contain soluble fiber. Examples of foods with high soluble fiber include oats, seeds, and certain fruits and vegetables. Take a fiber supplement if directed by your dietitian. ? Reduce or avoid certain foods called FODMAPs. These are foods that contain carbohydrates that are hard to digest. Ask your doctor which foods contain these carbohydrates. What foods are not recommended? The following are some  foods and drinks that may make your symptoms worse:  Fatty foods, such as french fries.  Foods that contain gluten, such as pasta and cereal.  Dairy products, such as milk, cheese, and ice cream.  Chocolate.  Alcohol.  Products with caffeine, such as coffee.  Carbonated drinks, such as soda.  Foods that are high in FODMAPs. These include certain fruits and vegetables.  Products with sweeteners such as honey, high fructose corn syrup, sorbitol, and mannitol. The items listed above may not be a complete list of foods and beverages you should avoid. Contact a dietitian for more information. What foods are good sources of fiber? Your health care provider or dietitian may recommend that you eat more foods that contain fiber. Fiber can help to reduce constipation and other IBS symptoms. Add foods with fiber to your diet a little at a time so your body can get used to them. Too much fiber at one time might cause gas and swelling of your abdomen. The following are some foods that are good sources of fiber:  Berries, such as raspberries, strawberries, and blueberries.  Tomatoes.  Carrots.  Brown rice.  Oats.  Seeds, such as chia and pumpkin seeds. The items listed above may not be a complete list of recommended sources of fiber. Contact your dietitian for more options. Where to find more information  International Foundation for Functional Gastrointestinal Disorders: www.iffgd.org  National Institute of Diabetes and Digestive and Kidney Diseases: www.niddk.nih.gov Summary  When you have irritable bowel syndrome (IBS), it is   very important to eat the foods and follow the eating habits that are best for your condition.  IBS may cause various symptoms such as pain in the abdomen, constipation, or diarrhea.  Choosing the right foods can help to ease the discomfort that comes from symptoms.  Keep a food diary. This will help you identify foods that cause symptoms.  Your health  care provider or diet and nutrition specialist (dietitian) may recommend that you eat more foods that contain fiber. This information is not intended to replace advice given to you by your health care provider. Make sure you discuss any questions you have with your health care provider. Document Revised: 08/12/2018 Document Reviewed: 12/24/2016 Elsevier Patient Education  2020 [employer] Inc.   Flank Pain, Adult Flank pain is pain in your side. The flank is the area of your side between your upper belly (abdomen) and your back. The pain may occur over a short time (acute), or it may be long-term or come back often (chronic). It may be mild or very bad. Pain in this area can be caused by many different things. Follow these instructions at home:   Drink enough fluid to keep your pee (urine) clear or pale yellow.  Rest as told by your doctor.  Take over-the-counter and prescription medicines only as told by your doctor.  Keep a journal to keep track of: ? What has caused your flank pain. ? What has made it feel better.  Keep all follow-up visits as told by your doctor. This is important. Contact a doctor if:  Medicine does not help your pain.  You have new symptoms.  Your pain gets worse.  You have a fever.  Your symptoms last longer than 2-3 days.  You have trouble peeing.  You are peeing more often than normal. Get help right away if:  You have trouble breathing.  You are short of breath.  Your belly hurts, or it is swollen or red.  You feel sick to your stomach (nauseous).  You throw up (vomit).  You feel like you will pass out, or you do pass out (faint).  You have blood in your pee. Summary  Flank pain is pain in your side. The flank is the area of your side between your upper belly (abdomen) and your back.  Flank pain may occur over a short time (acute), or it may be long-term or come back often (chronic). It may be mild or very bad.  Pain in this area can  be caused by many different things.  Contact your doctor if your symptoms get worse or they last longer than 2-3 days. This information is not intended to replace advice given to you by your health care provider. Make sure you discuss any questions you have with your health care provider. Document Revised: 04/04/2017 Document Reviewed: 08/12/2016 Elsevier Patient Education  2020 [employer] Inc.   Abdominal Pain, Adult Pain in the abdomen (abdominal pain) can be caused by many things. Often, abdominal pain is not serious and it gets better with no treatment or by being treated at home. However, sometimes abdominal pain is serious. Your health care provider will ask questions about your medical history and do a physical exam to try to determine the cause of your abdominal pain. Follow these instructions at home:  Medicines  Take over-the-counter and prescription medicines only as told by your health care provider.  Do not take a laxative unless told by your health care provider. General instructions  Watch your  condition for any changes.  Drink enough fluid to keep your urine pale yellow.  Keep all follow-up visits as told by your health care provider. This is important. Contact a health care provider if:  Your abdominal pain changes or gets worse.  You are not hungry or you lose weight without trying.  You are constipated or have diarrhea for more than 2-3 days.  You have pain when you urinate or have a bowel movement.  Your abdominal pain wakes you up at night.  Your pain gets worse with meals, after eating, or with certain foods.  You are vomiting and cannot keep anything down.  You have a fever.  You have blood in your urine. Get help right away if:  Your pain does not go away as soon as your health care provider told you to expect.  You cannot stop vomiting.  Your pain is only in areas of the abdomen, such as the right side or the left lower portion of the abdomen.  Pain on the right side could be caused by appendicitis.  You have bloody or black stools, or stools that look like tar.  You have severe pain, cramping, or bloating in your abdomen.  You have signs of dehydration, such as: ? Dark urine, very little urine, or no urine. ? Cracked lips. ? Dry mouth. ? Sunken eyes. ? Sleepiness. ? Weakness.  You have trouble breathing or chest pain. Summary  Often, abdominal pain is not serious and it gets better with no treatment or by being treated at home. However, sometimes abdominal pain is serious.  Watch your condition for any changes.  Take over-the-counter and prescription medicines only as told by your health care provider.  Contact a health care provider if your abdominal pain changes or gets worse.  Get help right away if you have severe pain, cramping, or bloating in your abdomen. This information is not intended to replace advice given to you by your health care provider. Make sure you discuss any questions you have with your health care provider. Document Revised: 08/31/2018 Document Reviewed: 08/31/2018 Elsevier Patient Education  2020 [employer].

## 2020-03-09 LAB — GC/CHLAMYDIA PROBE AMP (~~LOC~~) NOT AT ARMC
Chlamydia: NEGATIVE
Comment: NEGATIVE
Comment: NORMAL
Neisseria Gonorrhea: NEGATIVE

## 2020-03-10 ENCOUNTER — Other Ambulatory Visit: Payer: Self-pay

## 2020-03-10 ENCOUNTER — Ambulatory Visit
Admission: RE | Admit: 2020-03-10 | Discharge: 2020-03-10 | Disposition: A | Discharge status: Home / Self Care | Payer: Medicaid Other | Source: Ambulatory Visit | Attending: Family Medicine | Admitting: Family Medicine

## 2020-03-10 DIAGNOSIS — N946 Dysmenorrhea, unspecified: Secondary | ICD-10-CM | POA: Diagnosis present

## 2020-03-10 DIAGNOSIS — R102 Pelvic and perineal pain: Secondary | ICD-10-CM | POA: Insufficient documentation

## 2020-03-16 ENCOUNTER — Other Ambulatory Visit: Payer: Self-pay

## 2020-03-16 ENCOUNTER — Encounter: Payer: Self-pay | Admitting: Obstetrics and Gynecology

## 2020-03-16 ENCOUNTER — Ambulatory Visit (INDEPENDENT_AMBULATORY_CARE_PROVIDER_SITE_OTHER): Payer: Medicaid Other | Admitting: Obstetrics and Gynecology

## 2020-03-16 VITALS — BP 128/77 | HR 63 | Ht 68.0 in | Wt 175.0 lb

## 2020-03-16 DIAGNOSIS — N83292 Other ovarian cyst, left side: Secondary | ICD-10-CM | POA: Diagnosis not present

## 2020-03-16 MED ORDER — PROMETHAZINE HCL 25 MG PO TABS: 25.0000 mg | ORAL_TABLET | Freq: Four times a day (QID) | ORAL | 0 refills | Status: DC | PRN

## 2020-03-16 NOTE — H&P (Signed)
Obstetrics and Gynecology New Patient Evaluation  Appointment Date: 03/16/2020  OBGYN Clinic: [facility]  Primary Care Provider: Enid Baas  Referring Provider: Maternity Admissions Unit  Chief Complaint: 11/3 MAU follow up, LO cyst, LLQ pain  History of Present Illness: Penny Becker is a 29 y.o. Caucasian [id number] (Patient's last menstrual period was 02/23/2020.), seen for the above chief complaint. Her past medical history is significant for nothing.  Patient to MAU on 11/3 for persistent and worsening lower pelvic and LLQ pain; patient previously seen at [facility] urgent care on 10/29 for same s/s and set up for an u/s which was done on 11/5 and showed a 6cm LO cyst (see below).  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   She has a nexplanon in place that expires next year. Period occurred in mid October and usually qmonth but came on early at the three week mark this past "Sunday   Review of Systems: Pertinent items noted in HPI and remainder of comprehensive ROS otherwise negative.    Past Medical History:  Past Medical History:  Diagnosis Date  . Abnormal Pap smear   . Anemia    1st pregnancy, tx'd with iron  . Chlamydia   . Chronic hypertension during pregnancy, antepartum 02/16/2017   In review of patient's chart with Dr. Stinson, patient has GHTN and will be induced at 37 weeks on 02/22/17.   Patient had elevated BP in MAU on 02-16-2017 and in clinic on 02-07-2017.   [x] Aspirin 81 mg daily after 12 weeks; discontinue after 36 weeks Current antihypertensives:  None   Baseline and surveillance labs (pulled in from EPIC, refresh links as needed)  Lab Results Component Value Date   . Gestational hypertension w/o significant proteinuria in 3rd trimester 03/21/2011   12/24 24hr urine protein 64mg   . History of recurrent UTIs last one 2  months ago  . Hx of preeclampsia, prior pregnancy, currently pregnant 08/19/2016   PEC in 2009 IOL at 37 weeks  . Prior pregnancy complicated by PIH, antepartum    during 1st pregnancy  . Subchorionic hemorrhage 10/22/10    Past Surgical History:  Past Surgical History:  Procedure Laterality Date  . DILATION AND CURETTAGE OF UTERUS    . INDUCED ABORTION     20" 10, 2011    Past Obstetrical History:  OB History  Gravida Para Term Preterm AB Living  8 3 3  0 4 3  SAB TAB Ectopic Multiple Live Births  2 2 0 0 3    # Outcome Date GA Lbr Len/2nd Weight Sex Delivery Anes PTL Lv  8 Gravida           7 Term 02/22/17 [redacted]w[redacted]d 22:11 / 00:12 7 lb 0.7 oz (3.195 kg) M Vag-Spont EPI  LIV  6 SAB 02/2016          5 Term 05/27/11 [redacted]w[redacted]d 07:37 / 01:06 6 lb 13.2 oz (3.095 kg) M Vag-Spont EPI  LIV  4 TAB 2011          3 TAB 01/11/09          2 SAB 09/01/08          1 Term 07/11/07 [redacted]w[redacted]d  6 lb 4 oz (2.835 kg) F Vag-Spont EPI  LIV     Birth Comments: Induced for preeclampsia     Social History:  Social History  Socioeconomic History  . Marital status: Single    Spouse name: Not on file  . Number of children: Not on file  . Years of education: Not on file  . Highest education level: Not on file  Occupational History  . Not on file  Tobacco Use  . Smoking status: Former Smoker    Packs/day: 1.00    Quit date: 01/20/2016    Years since quitting: 4.1  . Smokeless tobacco: Never Used  Substance and Sexual Activity  . Alcohol use: No  . Drug use: No  . Sexual activity: Yes    Birth control/protection: Implant  Other Topics Concern  . Not on file  Social History Narrative  . Not on file   Social Determinants of Health   Financial Resource Strain:   . Difficulty of Paying Living Expenses: Not on file  Food Insecurity:   . Worried About Programme researcher, broadcasting/film/video in the Last Year: Not on file  . Ran Out of Food in the Last Year: Not on file  Transportation Needs:   . Lack of Transportation  (Medical): Not on file  . Lack of Transportation (Non-Medical): Not on file  Physical Activity:   . Days of Exercise per Week: Not on file  . Minutes of Exercise per Session: Not on file  Stress:   . Feeling of Stress : Not on file  Social Connections:   . Frequency of Communication with Friends and Family: Not on file  . Frequency of Social Gatherings with Friends and Family: Not on file  . Attends Religious Services: Not on file  . Active Member of Clubs or Organizations: Not on file  . Attends Banker Meetings: Not on file  . Marital Status: Not on file  Intimate Partner Violence:   . Fear of Current or Ex-Partner: Not on file  . Emotionally Abused: Not on file  . Physically Abused: Not on file  . Sexually Abused: Not on file    Family History:  Family History  Problem Relation Age of Onset  . Heart disease Maternal Grandmother   . Liver disease Maternal Grandmother   . Heart disease Maternal Grandfather   . Liver disease Maternal Grandfather   . Schizophrenia Paternal Grandmother   . Diabetes Paternal Grandmother   . Mental illness Paternal Grandmother   . Hypertension Father   . Kidney disease Daughter        horse shoe kidneys recurrent UTI  . Hypertension Mother   . Multiple sclerosis Mother   . Anesthesia problems Neg Hx      Medications [name] had no medications administered during this visit. Current Outpatient Medications  Medication Sig Dispense Refill  . Etonogestrel (IMPLANON Rupert) Inject into the skin.     No current facility-administered medications for this visit.    Allergies Patient has no known allergies.   Physical Exam:  BP 128/77   Pulse 63   Ht 5\' 8"  (1.727 m)   Wt 175 lb (79.4 kg)   LMP 02/23/2020   BMI 26.61 kg/m  Body mass index is 26.61 kg/m. General appearance: Well nourished, well developed female in no acute distress.  Cardiovascular: normal s1 and s2.  No murmurs, rubs or gallops. Respiratory:  Clear  to auscultation bilateral. Normal respiratory effort Abdomen: positive bowel sounds and no masses, hernias; diffusely non tender to palpation, non distended Neuro/Psych:  Normal mood and affect.  Skin:  Warm and dry.   Laboratory: negative cmp, cbc, gc/ct, wet prep  Radiology: images reviewed  Narrative & Impression  CLINICAL DATA:  Dysmenorrhea, pelvic cramping, LMP 02/22/2020  EXAM: TRANSABDOMINAL AND TRANSVAGINAL ULTRASOUND OF PELVIS  TECHNIQUE: Both transabdominal and transvaginal ultrasound examinations of the pelvis were performed. Transabdominal technique was performed for global imaging of the pelvis including uterus, ovaries, adnexal regions, and pelvic cul-de-sac. It was necessary to proceed with endovaginal exam following the transabdominal exam to visualize the endometrium, RIGHT ovary, and to characterize a LEFT ovarian cystic lesion.  COMPARISON:  08/19/2010  FINDINGS: Uterus  Measurements: 9.8 x 5.0 x 6.8 cm = volume: 174 mL. Anteverted. Normal morphology without mass  Endometrium  Thickness: 4 mm.  No endometrial fluid or focal abnormality  Right ovary  Measurements: 3.1 x 1.8 x 1.7 cm = volume: 4.9 mL. Normal morphology without mass  Left ovary  Measurements: 7.2 x 4.1 x 4.7 cm = volume: 72.1 mL. Large cyst 5.9 x 4.2 x 4.8 cm containing a small peripheral daughter cyst/septation 1.3 cm diameter; recommend follow-up US in 3-6 months. Note: This recommendation does not apply to premenarchal patients or to those with increased risk (genetic, family history, elevated tumor markers or other high-risk factors) of ovarian cancer. Reference: Radiology 2019 Nov; 293(2):359-371.  Other findings  No free pelvic fluid.  No adnexal masses.  IMPRESSION: Unremarkable uterus, endometrial complex and RIGHT ovary.  Large cyst LEFT ovary 5.9 cm greatest size containing a small peripheral daughter cyst/septation 1.3 cm diameter;  follow-up ultrasound recommended in 3-6 months to assess stability.  No other pelvic sonographic abnormalities.   Electronically Signed   By: Ulyses Southward M.D.   On: 03/10/2020 18:17   Assessment: pt stable  Plan:  1. Complex cyst of left ovary D/w her re: exp management vs surgery. I told her features aren't concerning for malignancy, especially given her age and d/w her re: exp management and rpt u/s vs surgery. Given her s/s, she'd like to move forward with surgery. I d/w her plan for cystectomy but also indications for oophorectomy. Request sent for l/s left ovarian cystectomy. PRN phenergan given  RTC post op  [name] MD Attending [facility])

## 2020-03-16 NOTE — H&P (View-Only) (Signed)
Obstetrics and Gynecology New Patient Evaluation  Appointment Date: 03/16/2020  OBGYN Clinic: [facility]  Primary Care Provider: Enid Baas  Referring Provider: Maternity Admissions Unit  Chief Complaint: 11/3 MAU follow up, LO cyst, LLQ pain  History of Present Illness: Penny Becker is a 29 y.o. Caucasian [id number] (Patient's last menstrual period was 02/23/2020.), seen for the above chief complaint. Her past medical history is significant for nothing.  Patient to MAU on 11/3 for persistent and worsening lower pelvic and LLQ pain; patient previously seen at [facility] urgent care on 10/29 for same s/s and set up for an u/s which was done on 11/5 and showed a 6cm LO cyst (see below).  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   She has a nexplanon in place that expires next year. Period occurred in mid October and usually qmonth but came on early at the three week mark this past "Sunday   Review of Systems: Pertinent items noted in HPI and remainder of comprehensive ROS otherwise negative.    Past Medical History:  Past Medical History:  Diagnosis Date  . Abnormal Pap smear   . Anemia    1st pregnancy, tx'd with iron  . Chlamydia   . Chronic hypertension during pregnancy, antepartum 02/16/2017   In review of patient's chart with Dr. Stinson, patient has GHTN and will be induced at 37 weeks on 02/22/17.   Patient had elevated BP in MAU on 02-16-2017 and in clinic on 02-07-2017.   [x] Aspirin 81 mg daily after 12 weeks; discontinue after 36 weeks Current antihypertensives:  None   Baseline and surveillance labs (pulled in from EPIC, refresh links as needed)  Lab Results Component Value Date   . Gestational hypertension w/o significant proteinuria in 3rd trimester 03/21/2011   12/24 24hr urine protein 64mg   . History of recurrent UTIs last one 2  months ago  . Hx of preeclampsia, prior pregnancy, currently pregnant 08/19/2016   PEC in 2009 IOL at 37 weeks  . Prior pregnancy complicated by PIH, antepartum    during 1st pregnancy  . Subchorionic hemorrhage 10/22/10    Past Surgical History:  Past Surgical History:  Procedure Laterality Date  . DILATION AND CURETTAGE OF UTERUS    . INDUCED ABORTION     20" 10, 2011    Past Obstetrical History:  OB History  Gravida Para Term Preterm AB Living  8 3 3  0 4 3  SAB TAB Ectopic Multiple Live Births  2 2 0 0 3    # Outcome Date GA Lbr Len/2nd Weight Sex Delivery Anes PTL Lv  8 Gravida           7 Term 02/22/17 [redacted]w[redacted]d 22:11 / 00:12 7 lb 0.7 oz (3.195 kg) M Vag-Spont EPI  LIV  6 SAB 02/2016          5 Term 05/27/11 [redacted]w[redacted]d 07:37 / 01:06 6 lb 13.2 oz (3.095 kg) M Vag-Spont EPI  LIV  4 TAB 2011          3 TAB 01/11/09          2 SAB 09/01/08          1 Term 07/11/07 [redacted]w[redacted]d  6 lb 4 oz (2.835 kg) F Vag-Spont EPI  LIV     Birth Comments: Induced for preeclampsia     Social History:  Social History  Socioeconomic History  . Marital status: Single    Spouse name: Not on file  . Number of children: Not on file  . Years of education: Not on file  . Highest education level: Not on file  Occupational History  . Not on file  Tobacco Use  . Smoking status: Former Smoker    Packs/day: 1.00    Quit date: 01/20/2016    Years since quitting: 4.1  . Smokeless tobacco: Never Used  Substance and Sexual Activity  . Alcohol use: No  . Drug use: No  . Sexual activity: Yes    Birth control/protection: Implant  Other Topics Concern  . Not on file  Social History Narrative  . Not on file   Social Determinants of Health   Financial Resource Strain:   . Difficulty of Paying Living Expenses: Not on file  Food Insecurity:   . Worried About Programme researcher, broadcasting/film/video in the Last Year: Not on file  . Ran Out of Food in the Last Year: Not on file  Transportation Needs:   . Lack of Transportation  (Medical): Not on file  . Lack of Transportation (Non-Medical): Not on file  Physical Activity:   . Days of Exercise per Week: Not on file  . Minutes of Exercise per Session: Not on file  Stress:   . Feeling of Stress : Not on file  Social Connections:   . Frequency of Communication with Friends and Family: Not on file  . Frequency of Social Gatherings with Friends and Family: Not on file  . Attends Religious Services: Not on file  . Active Member of Clubs or Organizations: Not on file  . Attends Banker Meetings: Not on file  . Marital Status: Not on file  Intimate Partner Violence:   . Fear of Current or Ex-Partner: Not on file  . Emotionally Abused: Not on file  . Physically Abused: Not on file  . Sexually Abused: Not on file    Family History:  Family History  Problem Relation Age of Onset  . Heart disease Maternal Grandmother   . Liver disease Maternal Grandmother   . Heart disease Maternal Grandfather   . Liver disease Maternal Grandfather   . Schizophrenia Paternal Grandmother   . Diabetes Paternal Grandmother   . Mental illness Paternal Grandmother   . Hypertension Father   . Kidney disease Daughter        horse shoe kidneys recurrent UTI  . Hypertension Mother   . Multiple sclerosis Mother   . Anesthesia problems Neg Hx      Medications [name] had no medications administered during this visit. Current Outpatient Medications  Medication Sig Dispense Refill  . Etonogestrel (IMPLANON Rupert) Inject into the skin.     No current facility-administered medications for this visit.    Allergies Patient has no known allergies.   Physical Exam:  BP 128/77   Pulse 63   Ht 5\' 8"  (1.727 m)   Wt 175 lb (79.4 kg)   LMP 02/23/2020   BMI 26.61 kg/m  Body mass index is 26.61 kg/m. General appearance: Well nourished, well developed female in no acute distress.  Cardiovascular: normal s1 and s2.  No murmurs, rubs or gallops. Respiratory:  Clear  to auscultation bilateral. Normal respiratory effort Abdomen: positive bowel sounds and no masses, hernias; diffusely non tender to palpation, non distended Neuro/Psych:  Normal mood and affect.  Skin:  Warm and dry.   Laboratory: negative cmp, cbc, gc/ct, wet prep  Radiology: images reviewed  Narrative & Impression  CLINICAL DATA:  Dysmenorrhea, pelvic cramping, LMP 02/22/2020  EXAM: TRANSABDOMINAL AND TRANSVAGINAL ULTRASOUND OF PELVIS  TECHNIQUE: Both transabdominal and transvaginal ultrasound examinations of the pelvis were performed. Transabdominal technique was performed for global imaging of the pelvis including uterus, ovaries, adnexal regions, and pelvic cul-de-sac. It was necessary to proceed with endovaginal exam following the transabdominal exam to visualize the endometrium, RIGHT ovary, and to characterize a LEFT ovarian cystic lesion.  COMPARISON:  08/19/2010  FINDINGS: Uterus  Measurements: 9.8 x 5.0 x 6.8 cm = volume: 174 mL. Anteverted. Normal morphology without mass  Endometrium  Thickness: 4 mm.  No endometrial fluid or focal abnormality  Right ovary  Measurements: 3.1 x 1.8 x 1.7 cm = volume: 4.9 mL. Normal morphology without mass  Left ovary  Measurements: 7.2 x 4.1 x 4.7 cm = volume: 72.1 mL. Large cyst 5.9 x 4.2 x 4.8 cm containing a small peripheral daughter cyst/septation 1.3 cm diameter; recommend follow-up US in 3-6 months. Note: This recommendation does not apply to premenarchal patients or to those with increased risk (genetic, family history, elevated tumor markers or other high-risk factors) of ovarian cancer. Reference: Radiology 2019 Nov; 293(2):359-371.  Other findings  No free pelvic fluid.  No adnexal masses.  IMPRESSION: Unremarkable uterus, endometrial complex and RIGHT ovary.  Large cyst LEFT ovary 5.9 cm greatest size containing a small peripheral daughter cyst/septation 1.3 cm diameter;  follow-up ultrasound recommended in 3-6 months to assess stability.  No other pelvic sonographic abnormalities.   Electronically Signed   By: Ulyses Southward M.D.   On: 03/10/2020 18:17   Assessment: pt stable  Plan:  1. Complex cyst of left ovary D/w her re: exp management vs surgery. I told her features aren't concerning for malignancy, especially given her age and d/w her re: exp management and rpt u/s vs surgery. Given her s/s, she'd like to move forward with surgery. I d/w her plan for cystectomy but also indications for oophorectomy. Request sent for l/s left ovarian cystectomy. PRN phenergan given  RTC post op  [name] MD Attending [facility])

## 2020-03-16 NOTE — Progress Notes (Signed)
Obstetrics and Gynecology New Patient Evaluation  Appointment Date: 03/16/2020  OBGYN Clinic: [facility]  Primary Care Provider: Enid Baas  Referring Provider: Maternity Admissions Unit  Chief Complaint: 11/3 MAU follow up, LO cyst, LLQ pain  History of Present Illness: Penny Becker is a 29 y.o. Caucasian [id number] (Patient's last menstrual period was 02/23/2020.), seen for the above chief complaint. Her past medical history is significant for nothing.  Patient to MAU on 11/3 for persistent and worsening lower pelvic and LLQ pain; patient previously seen at [facility] urgent care on 10/29 for same s/s and set up for an u/s which was done on 11/5 and showed a 6cm LO cyst (see below).  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   She has a nexplanon in place that expires next year. Period occurred in mid October and usually qmonth but came on early at the three week mark this past "Sunday   Review of Systems: Pertinent items noted in HPI and remainder of comprehensive ROS otherwise negative.    Past Medical History:  Past Medical History:  Diagnosis Date  . Abnormal Pap smear   . Anemia    1st pregnancy, tx'd with iron  . Chlamydia   . Chronic hypertension during pregnancy, antepartum 02/16/2017   In review of patient's chart with Dr. Stinson, patient has GHTN and will be induced at 37 weeks on 02/22/17.   Patient had elevated BP in MAU on 02-16-2017 and in clinic on 02-07-2017.   [x] Aspirin 81 mg daily after 12 weeks; discontinue after 36 weeks Current antihypertensives:  None   Baseline and surveillance labs (pulled in from EPIC, refresh links as needed)  Lab Results Component Value Date   . Gestational hypertension w/o significant proteinuria in 3rd trimester 03/21/2011   12/24 24hr urine protein 64mg   . History of recurrent UTIs last one 2  months ago  . Hx of preeclampsia, prior pregnancy, currently pregnant 08/19/2016   PEC in 2009 IOL at 37 weeks  . Prior pregnancy complicated by PIH, antepartum    during 1st pregnancy  . Subchorionic hemorrhage 10/22/10    Past Surgical History:  Past Surgical History:  Procedure Laterality Date  . DILATION AND CURETTAGE OF UTERUS    . INDUCED ABORTION     20" 10, 2011    Past Obstetrical History:  OB History  Gravida Para Term Preterm AB Living  8 3 3  0 4 3  SAB TAB Ectopic Multiple Live Births  2 2 0 0 3    # Outcome Date GA Lbr Len/2nd Weight Sex Delivery Anes PTL Lv  8 Gravida           7 Term 02/22/17 [redacted]w[redacted]d 22:11 / 00:12 7 lb 0.7 oz (3.195 kg) M Vag-Spont EPI  LIV  6 SAB 02/2016          5 Term 05/27/11 [redacted]w[redacted]d 07:37 / 01:06 6 lb 13.2 oz (3.095 kg) M Vag-Spont EPI  LIV  4 TAB 2011          3 TAB 01/11/09          2 SAB 09/01/08          1 Term 07/11/07 [redacted]w[redacted]d  6 lb 4 oz (2.835 kg) F Vag-Spont EPI  LIV     Birth Comments: Induced for preeclampsia     Social History:  Social History  Socioeconomic History  . Marital status: Single    Spouse name: Not on file  . Number of children: Not on file  . Years of education: Not on file  . Highest education level: Not on file  Occupational History  . Not on file  Tobacco Use  . Smoking status: Former Smoker    Packs/day: 1.00    Quit date: 01/20/2016    Years since quitting: 4.1  . Smokeless tobacco: Never Used  Substance and Sexual Activity  . Alcohol use: No  . Drug use: No  . Sexual activity: Yes    Birth control/protection: Implant  Other Topics Concern  . Not on file  Social History Narrative  . Not on file   Social Determinants of Health   Financial Resource Strain:   . Difficulty of Paying Living Expenses: Not on file  Food Insecurity:   . Worried About Programme researcher, broadcasting/film/video in the Last Year: Not on file  . Ran Out of Food in the Last Year: Not on file  Transportation Needs:   . Lack of Transportation  (Medical): Not on file  . Lack of Transportation (Non-Medical): Not on file  Physical Activity:   . Days of Exercise per Week: Not on file  . Minutes of Exercise per Session: Not on file  Stress:   . Feeling of Stress : Not on file  Social Connections:   . Frequency of Communication with Friends and Family: Not on file  . Frequency of Social Gatherings with Friends and Family: Not on file  . Attends Religious Services: Not on file  . Active Member of Clubs or Organizations: Not on file  . Attends Banker Meetings: Not on file  . Marital Status: Not on file  Intimate Partner Violence:   . Fear of Current or Ex-Partner: Not on file  . Emotionally Abused: Not on file  . Physically Abused: Not on file  . Sexually Abused: Not on file    Family History:  Family History  Problem Relation Age of Onset  . Heart disease Maternal Grandmother   . Liver disease Maternal Grandmother   . Heart disease Maternal Grandfather   . Liver disease Maternal Grandfather   . Schizophrenia Paternal Grandmother   . Diabetes Paternal Grandmother   . Mental illness Paternal Grandmother   . Hypertension Father   . Kidney disease Daughter        horse shoe kidneys recurrent UTI  . Hypertension Mother   . Multiple sclerosis Mother   . Anesthesia problems Neg Hx      Medications [name] had no medications administered during this visit. Current Outpatient Medications  Medication Sig Dispense Refill  . Etonogestrel (IMPLANON Rupert) Inject into the skin.     No current facility-administered medications for this visit.    Allergies Patient has no known allergies.   Physical Exam:  BP 128/77   Pulse 63   Ht 5\' 8"  (1.727 m)   Wt 175 lb (79.4 kg)   LMP 02/23/2020   BMI 26.61 kg/m  Body mass index is 26.61 kg/m. General appearance: Well nourished, well developed female in no acute distress.  Cardiovascular: normal s1 and s2.  No murmurs, rubs or gallops. Respiratory:  Clear  to auscultation bilateral. Normal respiratory effort Abdomen: positive bowel sounds and no masses, hernias; diffusely non tender to palpation, non distended Neuro/Psych:  Normal mood and affect.  Skin:  Warm and dry.   Laboratory: negative cmp, cbc, gc/ct, wet prep  Radiology: images reviewed  Narrative & Impression  CLINICAL DATA:  Dysmenorrhea, pelvic cramping, LMP 02/22/2020  EXAM: TRANSABDOMINAL AND TRANSVAGINAL ULTRASOUND OF PELVIS  TECHNIQUE: Both transabdominal and transvaginal ultrasound examinations of the pelvis were performed. Transabdominal technique was performed for global imaging of the pelvis including uterus, ovaries, adnexal regions, and pelvic cul-de-sac. It was necessary to proceed with endovaginal exam following the transabdominal exam to visualize the endometrium, RIGHT ovary, and to characterize a LEFT ovarian cystic lesion.  COMPARISON:  08/19/2010  FINDINGS: Uterus  Measurements: 9.8 x 5.0 x 6.8 cm = volume: 174 mL. Anteverted. Normal morphology without mass  Endometrium  Thickness: 4 mm.  No endometrial fluid or focal abnormality  Right ovary  Measurements: 3.1 x 1.8 x 1.7 cm = volume: 4.9 mL. Normal morphology without mass  Left ovary  Measurements: 7.2 x 4.1 x 4.7 cm = volume: 72.1 mL. Large cyst 5.9 x 4.2 x 4.8 cm containing a small peripheral daughter cyst/septation 1.3 cm diameter; recommend follow-up US in 3-6 months. Note: This recommendation does not apply to premenarchal patients or to those with increased risk (genetic, family history, elevated tumor markers or other high-risk factors) of ovarian cancer. Reference: Radiology 2019 Nov; 293(2):359-371.  Other findings  No free pelvic fluid.  No adnexal masses.  IMPRESSION: Unremarkable uterus, endometrial complex and RIGHT ovary.  Large cyst LEFT ovary 5.9 cm greatest size containing a small peripheral daughter cyst/septation 1.3 cm diameter;  follow-up ultrasound recommended in 3-6 months to assess stability.  No other pelvic sonographic abnormalities.   Electronically Signed   By: Mark  Boles M.D.   On: 03/10/2020 18:17   Assessment: pt stable  Plan:  1. Complex cyst of left ovary D/w her re: exp management vs surgery. I told her features aren't concerning for malignancy, especially given her age and d/w her re: exp management and rpt u/s vs surgery. Given her s/s, she'd like to move forward with surgery. I d/w her plan for cystectomy but also indications for oophorectomy. Request sent for l/s left ovarian cystectomy. PRN phenergan given  RTC post op  Belky Mundo, Jr MD Attending [facility] (Faculty Practice)    

## 2020-03-18 ENCOUNTER — Other Ambulatory Visit: Payer: Self-pay | Admitting: Obstetrics and Gynecology

## 2020-03-22 NOTE — Progress Notes (Signed)
[id number] [facility], [place] - [address] [place] [address] Phone: [phone] Fax: [phone]      Your procedure is scheduled on Tuesday November 23rd.  Report to [facility] Main Entrance "A" at 12:00 P.M., and check in at the Admitting office.  Call this number if you have problems the morning of surgery:  [phone]  Call [phone] if you have any questions prior to your surgery date Monday-Friday 8am-4pm    Remember:  Do not eat after midnight the night before your surgery  You may drink clear liquids until 11:00am the morning of your surgery.   Clear liquids allowed are: Water, Non-Citrus Juices (without pulp), Carbonated Beverages, Clear Tea, Black Coffee Only, and Gatorade    Take these medicines the morning of surgery if needed with A SIP OF WATER    loratadine (CLARITIN) 10 MG tablet  promethazine (PHENERGAN) 25 MG tablet     As of today, STOP taking any Aspirin (unless otherwise instructed by your surgeon) Aleve, Naproxen, Ibuprofen, Motrin, Advil, Goody's, BC's, all herbal medications, fish oil, and all vitamins.                      Do not wear jewelry, make up, or nail polish            Do not wear lotions, powders, perfumes, or deodorant.            Do not shave 48 hours prior to surgery.              Do not bring valuables to the hospital.            [facility] is not responsible for any belongings or valuables.  Do NOT Smoke (Tobacco/Vaping) or drink Alcohol 24 hours prior to your procedure If you use a CPAP at night, you may bring all equipment for your overnight stay.   Contacts, glasses, dentures or bridgework may not be worn into surgery.      For patients admitted to the hospital, discharge time will be determined by your treatment team.   Patients discharged the day of surgery will not be allowed to drive home, and someone needs to stay with them for 24 hours.    Special instructions:   [facility]-  Preparing For Surgery  Before surgery, you can play an important role. Because skin is not sterile, your skin needs to be as free of germs as possible. You can reduce the number of germs on your skin by washing with CHG (chlorahexidine gluconate) Soap before surgery.  CHG is an antiseptic cleaner which kills germs and bonds with the skin to continue killing germs even after washing.    Oral Hygiene is also important to reduce your risk of infection.  Remember - BRUSH YOUR TEETH THE MORNING OF SURGERY WITH YOUR REGULAR TOOTHPASTE  Please do not use if you have an allergy to CHG or antibacterial soaps. If your skin becomes reddened/irritated stop using the CHG.  Do not shave (including legs and underarms) for at least 48 hours prior to first CHG shower. It is OK to shave your face.  Please follow these instructions carefully.   1. Shower the NIGHT BEFORE SURGERY and the MORNING OF SURGERY with CHG Soap.   2. If you chose to wash your hair, wash your hair first as usual with your normal shampoo.  3. After you shampoo, rinse your hair and body thoroughly to remove the  shampoo.  4. Use CHG as you would any other liquid soap. You can apply CHG directly to the skin and wash gently with a scrungie or a clean washcloth.   5. Apply the CHG Soap to your body ONLY FROM THE NECK DOWN.  Do not use on open wounds or open sores. Avoid contact with your eyes, ears, mouth and genitals (private parts). Wash Face and genitals (private parts)  with your normal soap.   6. Wash thoroughly, paying special attention to the area where your surgery will be performed.  7. Thoroughly rinse your body with warm water from the neck down.  8. DO NOT shower/wash with your normal soap after using and rinsing off the CHG Soap.  9. Pat yourself dry with a CLEAN TOWEL.  10. Wear CLEAN PAJAMAS to bed the night before surgery  11. Place CLEAN SHEETS on your bed the night of your first shower and DO NOT SLEEP WITH  PETS.   Day of Surgery: Wear Clean/Comfortable clothing the morning of surgery Do not apply any deodorants/lotions.   Remember to brush your teeth WITH YOUR REGULAR TOOTHPASTE.   Please read over the following fact sheets that you were given.

## 2020-03-23 ENCOUNTER — Other Ambulatory Visit: Payer: Self-pay

## 2020-03-23 ENCOUNTER — Encounter (HOSPITAL_COMMUNITY)
Admission: RE | Admit: 2020-03-23 | Discharge: 2020-03-23 | Disposition: A | Discharge status: Home / Self Care | Payer: Medicaid Other | Source: Ambulatory Visit | Attending: Obstetrics and Gynecology | Admitting: Obstetrics and Gynecology

## 2020-03-23 ENCOUNTER — Encounter (HOSPITAL_COMMUNITY): Payer: Self-pay

## 2020-03-23 DIAGNOSIS — Z01812 Encounter for preprocedural laboratory examination: Secondary | ICD-10-CM | POA: Insufficient documentation

## 2020-03-23 HISTORY — DX: Gastro-esophageal reflux disease without esophagitis: K21.9

## 2020-03-23 LAB — CBC
HCT: 39.5 % (ref 36.0–46.0)
Hemoglobin: 12.6 g/dL (ref 12.0–15.0)
MCH: 30.4 pg (ref 26.0–34.0)
MCHC: 31.9 g/dL (ref 30.0–36.0)
MCV: 95.2 fL (ref 80.0–100.0)
Platelets: 260 10*3/uL (ref 150–400)
RBC: 4.15 MIL/uL (ref 3.87–5.11)
RDW: 11.5 % (ref 11.5–15.5)
WBC: 4.9 10*3/uL (ref 4.0–10.5)
nRBC: 0 % (ref 0.0–0.2)

## 2020-03-23 NOTE — Progress Notes (Signed)
PCP - Enid Baas Cardiologist - denies  PPM/ICD - denies   Chest x-ray - n/a EKG - n/a Stress Test -denies  ECHO - denies Cardiac Cath - denies  Sleep Study - denies  Patient instructed to hold all Aspirin, NSAID's, herbal medications, fish oil and vitamins 7 days prior to surgery.   ERAS Protcol -yes PRE-SURGERY Ensure or G2-ensure given and instructions given   COVID TEST-03/24/2020 1300 at [facility]    Anesthesia review: no  Patient denies shortness of breath, fever, cough and chest pain at PAT appointment   All instructions explained to the patient, with a verbal understanding of the material. Patient agrees to go over the instructions while at home for a better understanding. Patient also instructed to self quarantine after being tested for COVID-19. The opportunity to ask questions was provided.

## 2020-03-23 NOTE — Progress Notes (Signed)
[id number], [place] - [address] [place] [address] Phone: [phone] Fax: [phone]      Your procedure is scheduled on Tuesday November 23rd.  Report to [facility] Main Entrance "A" at 12:00 P.M., and check in at the Admitting office.  Call this number if you have problems the morning of surgery:  [phone]  Call [phone] if you have any questions prior to your surgery date Monday-Friday 8am-4pm    Remember:  Do not eat after midnight the night before your surgery  You may drink clear liquids until 11:00am the morning of your surgery.   Clear liquids allowed are: Water, Non-Citrus Juices (without pulp), Carbonated Beverages, Clear Tea, Black Coffee Only, and Gatorade Patient Instructions  . The night before surgery:  o No food after midnight. ONLY clear liquids after midnight  . The day of surgery (if you do NOT have diabetes):  o Drink ONE (1) Pre-Surgery Clear Ensure by 11:00am. Please make this the last thing you drink. Please drink in one sitting.   o This drink was given to you during your hospital  pre-op appointment visit. o Nothing else to drink after completing the  Pre-Surgery Clear Ensure.         If you have questions, please contact your surgeon's office.     Take these medicines the morning of surgery if needed with A SIP OF WATER    loratadine (CLARITIN) 10 MG tablet  promethazine (PHENERGAN) 25 MG tablet     As of today, STOP taking any Aspirin (unless otherwise instructed by your surgeon) Aleve, Naproxen, Ibuprofen, Motrin, Advil, Goody's, BC's, all herbal medications, fish oil, and all vitamins.                      Do not wear jewelry, make up, or nail polish            Do not wear lotions, powders, perfumes, or deodorant.            Do not shave 48 hours prior to surgery.              Do not bring valuables to the hospital.            [facility] is not responsible for any belongings or  valuables.  Do NOT Smoke (Tobacco/Vaping) or drink Alcohol 24 hours prior to your procedure If you use a CPAP at night, you may bring all equipment for your overnight stay.   Contacts, glasses, dentures or bridgework may not be worn into surgery.      For patients admitted to the hospital, discharge time will be determined by your treatment team.   Patients discharged the day of surgery will not be allowed to drive home, and someone needs to stay with them for 24 hours.    Special instructions:   [place]- Preparing For Surgery  Before surgery, you can play an important role. Because skin is not sterile, your skin needs to be as free of germs as possible. You can reduce the number of germs on your skin by washing with CHG (chlorahexidine gluconate) Soap before surgery.  CHG is an antiseptic cleaner which kills germs and bonds with the skin to continue killing germs even after washing.    Oral Hygiene is also important to reduce your risk of infection.  Remember - BRUSH YOUR TEETH THE MORNING OF SURGERY WITH YOUR REGULAR TOOTHPASTE  Please do not use  if you have an allergy to CHG or antibacterial soaps. If your skin becomes reddened/irritated stop using the CHG.  Do not shave (including legs and underarms) for at least 48 hours prior to first CHG shower. It is OK to shave your face.  Please follow these instructions carefully.   1. Shower the NIGHT BEFORE SURGERY and the MORNING OF SURGERY with CHG Soap.   2. If you chose to wash your hair, wash your hair first as usual with your normal shampoo.  3. After you shampoo, rinse your hair and body thoroughly to remove the shampoo.  4. Use CHG as you would any other liquid soap. You can apply CHG directly to the skin and wash gently with a scrungie or a clean washcloth.   5. Apply the CHG Soap to your body ONLY FROM THE NECK DOWN.  Do not use on open wounds or open sores. Avoid contact with your eyes, ears, mouth and genitals (private  parts). Wash Face and genitals (private parts)  with your normal soap.   6. Wash thoroughly, paying special attention to the area where your surgery will be performed.  7. Thoroughly rinse your body with warm water from the neck down.  8. DO NOT shower/wash with your normal soap after using and rinsing off the CHG Soap.  9. Pat yourself dry with a CLEAN TOWEL.  10. Wear CLEAN PAJAMAS to bed the night before surgery  11. Place CLEAN SHEETS on your bed the night of your first shower and DO NOT SLEEP WITH PETS.   Day of Surgery: Wear Clean/Comfortable clothing the morning of surgery Do not apply any deodorants/lotions.   Remember to brush your teeth WITH YOUR REGULAR TOOTHPASTE.   Please read over the following fact sheets that you were given.

## 2020-03-24 ENCOUNTER — Other Ambulatory Visit
Admission: RE | Admit: 2020-03-24 | Discharge: 2020-03-24 | Disposition: A | Discharge status: Home / Self Care | Payer: Medicaid Other | Source: Ambulatory Visit | Attending: Obstetrics and Gynecology | Admitting: Obstetrics and Gynecology

## 2020-03-24 DIAGNOSIS — Z20822 Contact with and (suspected) exposure to covid-19: Secondary | ICD-10-CM | POA: Insufficient documentation

## 2020-03-24 DIAGNOSIS — Z01812 Encounter for preprocedural laboratory examination: Secondary | ICD-10-CM | POA: Diagnosis present

## 2020-03-25 LAB — SARS CORONAVIRUS 2 (TAT 6-24 HRS): SARS Coronavirus 2: NEGATIVE

## 2020-03-28 ENCOUNTER — Encounter (HOSPITAL_COMMUNITY)
Admission: RE | Disposition: A | Discharge status: Home / Self Care | Payer: Self-pay | Source: Home / Self Care | Attending: Obstetrics and Gynecology

## 2020-03-28 ENCOUNTER — Encounter (HOSPITAL_COMMUNITY): Payer: Self-pay | Admitting: Obstetrics and Gynecology

## 2020-03-28 ENCOUNTER — Ambulatory Visit (HOSPITAL_COMMUNITY): Payer: Medicaid Other | Admitting: Certified Registered Nurse Anesthetist

## 2020-03-28 ENCOUNTER — Other Ambulatory Visit: Payer: Self-pay

## 2020-03-28 ENCOUNTER — Ambulatory Visit (HOSPITAL_COMMUNITY)
Admission: RE | Admit: 2020-03-28 | Discharge: 2020-03-28 | Disposition: A | Discharge status: Home / Self Care | Payer: Medicaid Other | Attending: Obstetrics and Gynecology | Admitting: Obstetrics and Gynecology

## 2020-03-28 DIAGNOSIS — R102 Pelvic and perineal pain: Secondary | ICD-10-CM | POA: Insufficient documentation

## 2020-03-28 DIAGNOSIS — N8312 Corpus luteum cyst of left ovary: Secondary | ICD-10-CM

## 2020-03-28 DIAGNOSIS — N83292 Other ovarian cyst, left side: Secondary | ICD-10-CM | POA: Diagnosis not present

## 2020-03-28 DIAGNOSIS — N8301 Follicular cyst of right ovary: Secondary | ICD-10-CM | POA: Insufficient documentation

## 2020-03-28 DIAGNOSIS — Z8759 Personal history of other complications of pregnancy, childbirth and the puerperium: Secondary | ICD-10-CM | POA: Insufficient documentation

## 2020-03-28 DIAGNOSIS — Z87891 Personal history of nicotine dependence: Secondary | ICD-10-CM | POA: Diagnosis not present

## 2020-03-28 DIAGNOSIS — Z9889 Other specified postprocedural states: Secondary | ICD-10-CM

## 2020-03-28 DIAGNOSIS — Z87440 Personal history of urinary (tract) infections: Secondary | ICD-10-CM | POA: Diagnosis not present

## 2020-03-28 DIAGNOSIS — N83201 Unspecified ovarian cyst, right side: Secondary | ICD-10-CM | POA: Diagnosis present

## 2020-03-28 HISTORY — PX: LAPAROSCOPIC OVARIAN CYSTECTOMY: SHX6248

## 2020-03-28 LAB — POCT PREGNANCY, URINE: Preg Test, Ur: NEGATIVE

## 2020-03-28 SURGERY — EXCISION, CYST, OVARY, LAPAROSCOPIC
Anesthesia: General | Site: Abdomen | Laterality: Bilateral

## 2020-03-28 MED ORDER — FENTANYL CITRATE (PF) 250 MCG/5ML IJ SOLN
INTRAMUSCULAR | Status: DC | PRN
  Administered 2020-03-28: 100 ug via INTRAVENOUS

## 2020-03-28 MED ORDER — ACETAMINOPHEN 500 MG PO TABS
1000.0000 mg | ORAL_TABLET | Freq: Once | ORAL | Status: AC
  Administered 2020-03-28: 1000 mg via ORAL
  Filled 2020-03-28: qty 2

## 2020-03-28 MED ORDER — ORAL CARE MOUTH RINSE: 15.0000 mL | Freq: Once | OROMUCOSAL | Status: AC

## 2020-03-28 MED ORDER — POVIDONE-IODINE 10 % EX SWAB
2.0000 "application " | Freq: Once | CUTANEOUS | Status: AC
  Administered 2020-03-28: 2 via TOPICAL

## 2020-03-28 MED ORDER — LIDOCAINE HCL (PF) 2 % IJ SOLN
INTRAMUSCULAR | Status: AC
  Filled 2020-03-28: qty 20

## 2020-03-28 MED ORDER — PROPOFOL 10 MG/ML IV BOLUS
INTRAVENOUS | Status: DC | PRN
  Administered 2020-03-28: 120 mg via INTRAVENOUS

## 2020-03-28 MED ORDER — IBUPROFEN 200 MG PO TABS: 600.0000 mg | ORAL_TABLET | Freq: Four times a day (QID) | ORAL | 0 refills | Status: DC | PRN

## 2020-03-28 MED ORDER — KETOROLAC TROMETHAMINE 30 MG/ML IJ SOLN
INTRAMUSCULAR | Status: AC
  Filled 2020-03-28: qty 1

## 2020-03-28 MED ORDER — SUGAMMADEX SODIUM 500 MG/5ML IV SOLN
INTRAVENOUS | Status: AC
  Filled 2020-03-28: qty 5

## 2020-03-28 MED ORDER — ROCURONIUM BROMIDE 10 MG/ML (PF) SYRINGE
PREFILLED_SYRINGE | INTRAVENOUS | Status: AC
  Filled 2020-03-28: qty 40

## 2020-03-28 MED ORDER — ROCURONIUM BROMIDE 10 MG/ML (PF) SYRINGE
PREFILLED_SYRINGE | INTRAVENOUS | Status: DC | PRN
  Administered 2020-03-28: 70 mg via INTRAVENOUS
  Administered 2020-03-28: 15 mg via INTRAVENOUS

## 2020-03-28 MED ORDER — DEXAMETHASONE SODIUM PHOSPHATE 10 MG/ML IJ SOLN
INTRAMUSCULAR | Status: DC | PRN
  Administered 2020-03-28: 10 mg via INTRAVENOUS

## 2020-03-28 MED ORDER — FENTANYL CITRATE (PF) 250 MCG/5ML IJ SOLN
INTRAMUSCULAR | Status: AC
  Filled 2020-03-28: qty 5

## 2020-03-28 MED ORDER — ONDANSETRON HCL 4 MG/2ML IJ SOLN
INTRAMUSCULAR | Status: AC
  Filled 2020-03-28: qty 2

## 2020-03-28 MED ORDER — LIDOCAINE 2% (20 MG/ML) 5 ML SYRINGE
INTRAMUSCULAR | Status: DC | PRN
  Administered 2020-03-28: 60 mg via INTRAVENOUS

## 2020-03-28 MED ORDER — DOCUSATE SODIUM 100 MG PO CAPS: 100.0000 mg | ORAL_CAPSULE | Freq: Two times a day (BID) | ORAL | 2 refills | Status: AC

## 2020-03-28 MED ORDER — SILVER NITRATE-POT NITRATE 75-25 % EX MISC
CUTANEOUS | Status: DC | PRN
  Administered 2020-03-28: 2

## 2020-03-28 MED ORDER — CHLORHEXIDINE GLUCONATE 0.12 % MT SOLN
15.0000 mL | Freq: Once | OROMUCOSAL | Status: AC
  Administered 2020-03-28: 15 mL via OROMUCOSAL
  Filled 2020-03-28: qty 15

## 2020-03-28 MED ORDER — EPHEDRINE 5 MG/ML INJ
INTRAVENOUS | Status: AC
  Filled 2020-03-28: qty 10

## 2020-03-28 MED ORDER — LACTATED RINGERS IV SOLN: INTRAVENOUS | Status: DC

## 2020-03-28 MED ORDER — BUPIVACAINE HCL 0.5 % IJ SOLN
INTRAMUSCULAR | Status: DC | PRN
  Administered 2020-03-28: 20 mL
  Administered 2020-03-28: 10 mL

## 2020-03-28 MED ORDER — BUPIVACAINE HCL (PF) 0.5 % IJ SOLN
INTRAMUSCULAR | Status: AC
  Filled 2020-03-28: qty 30

## 2020-03-28 MED ORDER — MIDAZOLAM HCL 5 MG/5ML IJ SOLN
INTRAMUSCULAR | Status: DC | PRN
  Administered 2020-03-28: 2 mg via INTRAVENOUS

## 2020-03-28 MED ORDER — SUGAMMADEX SODIUM 200 MG/2ML IV SOLN
INTRAVENOUS | Status: DC | PRN
  Administered 2020-03-28: 400 mg via INTRAVENOUS

## 2020-03-28 MED ORDER — KETOROLAC TROMETHAMINE 30 MG/ML IJ SOLN
INTRAMUSCULAR | Status: DC | PRN
  Administered 2020-03-28: 30 mg via INTRAVENOUS

## 2020-03-28 MED ORDER — MIDAZOLAM HCL 2 MG/2ML IJ SOLN
INTRAMUSCULAR | Status: AC
  Filled 2020-03-28: qty 2

## 2020-03-28 MED ORDER — OXYCODONE-ACETAMINOPHEN 5-325 MG PO TABS
1.0000 | ORAL_TABLET | Freq: Four times a day (QID) | ORAL | 0 refills | Status: DC | PRN
Start: 2020-03-28 — End: 2023-01-13

## 2020-03-28 MED ORDER — PROPOFOL 10 MG/ML IV BOLUS
INTRAVENOUS | Status: AC
  Filled 2020-03-28: qty 20

## 2020-03-28 MED ORDER — FENTANYL CITRATE (PF) 100 MCG/2ML IJ SOLN: 25.0000 ug | INTRAMUSCULAR | Status: DC | PRN

## 2020-03-28 MED ORDER — ONDANSETRON HCL 4 MG/2ML IJ SOLN
INTRAMUSCULAR | Status: DC | PRN
  Administered 2020-03-28: 4 mg via INTRAVENOUS

## 2020-03-28 MED ORDER — DEXAMETHASONE SODIUM PHOSPHATE 10 MG/ML IJ SOLN
INTRAMUSCULAR | Status: AC
  Filled 2020-03-28: qty 1

## 2020-03-28 SURGICAL SUPPLY — 41 items
APPLICATOR COTTON TIP 6 STRL (MISCELLANEOUS) ×1 IMPLANT
APPLICATOR COTTON TIP 6IN STRL (MISCELLANEOUS) ×3
BLADE SURG 15 STRL LF DISP TIS (BLADE) ×1 IMPLANT
BLADE SURG 15 STRL SS (BLADE) ×3
CABLE HIGH FREQUENCY MONO STRZ (ELECTRODE) IMPLANT
DEFOGGER SCOPE WARMER CLEARIFY (MISCELLANEOUS) ×3 IMPLANT
DERMABOND ADVANCED (GAUZE/BANDAGES/DRESSINGS) ×2
DERMABOND ADVANCED .7 DNX12 (GAUZE/BANDAGES/DRESSINGS) ×1 IMPLANT
DRSG OPSITE POSTOP 3X4 (GAUZE/BANDAGES/DRESSINGS) ×3 IMPLANT
DURAPREP 26ML APPLICATOR (WOUND CARE) ×3 IMPLANT
ELECT REM PT RETURN 9FT ADLT (ELECTROSURGICAL) ×3
ELECTRODE REM PT RTRN 9FT ADLT (ELECTROSURGICAL) ×1 IMPLANT
GLOVE BIOGEL PI IND STRL 7.0 (GLOVE) ×2 IMPLANT
GLOVE BIOGEL PI IND STRL 7.5 (GLOVE) ×2 IMPLANT
GLOVE BIOGEL PI INDICATOR 7.0 (GLOVE) ×4
GLOVE BIOGEL PI INDICATOR 7.5 (GLOVE) ×4
GLOVE SURG SS PI 7.0 STRL IVOR (GLOVE) ×3 IMPLANT
GOWN STRL REUS W/ TWL LRG LVL3 (GOWN DISPOSABLE) ×3 IMPLANT
GOWN STRL REUS W/TWL LRG LVL3 (GOWN DISPOSABLE) ×9
KIT TURNOVER KIT B (KITS) ×3 IMPLANT
LIGASURE VESSEL 5MM BLUNT TIP (ELECTROSURGICAL) IMPLANT
NS IRRIG 1000ML POUR BTL (IV SOLUTION) ×3 IMPLANT
PACK LAPAROSCOPY BASIN (CUSTOM PROCEDURE TRAY) ×3 IMPLANT
PACK TRENDGUARD 450 HYBRID PRO (MISCELLANEOUS) IMPLANT
PAD OB MATERNITY 4.3X12.25 (PERSONAL CARE ITEMS) ×3 IMPLANT
POUCH LAPAROSCOPIC INSTRUMENT (MISCELLANEOUS) ×3 IMPLANT
POUCH SPECIMEN RETRIEVAL 10MM (ENDOMECHANICALS) IMPLANT
PROTECTOR NERVE ULNAR (MISCELLANEOUS) ×6 IMPLANT
SCISSORS LAP 5X35 DISP (ENDOMECHANICALS) IMPLANT
SET IRRIG TUBING LAPAROSCOPIC (IRRIGATION / IRRIGATOR) IMPLANT
SET TUBE SMOKE EVAC HIGH FLOW (TUBING) ×3 IMPLANT
SLEEVE ADV FIXATION 5X100MM (TROCAR) IMPLANT
SUT MON AB 4-0 PS1 27 (SUTURE) ×3 IMPLANT
SUT VICRYL 0 UR6 27IN ABS (SUTURE) ×3 IMPLANT
SYR 10ML LL (SYRINGE) ×3 IMPLANT
TOWEL GREEN STERILE FF (TOWEL DISPOSABLE) ×6 IMPLANT
TRAY FOLEY W/BAG SLVR 14FR (SET/KITS/TRAYS/PACK) ×3 IMPLANT
TRENDGUARD 450 HYBRID PRO PACK (MISCELLANEOUS)
TROCAR ADV FIXATION 5X100MM (TROCAR) IMPLANT
TROCAR BALLN 12MMX100 BLUNT (TROCAR) ×3 IMPLANT
WARMER LAPAROSCOPE (MISCELLANEOUS) ×3 IMPLANT

## 2020-03-28 NOTE — Transfer of Care (Signed)
Immediate Anesthesia Transfer of Care Note  Patient: Penny Becker  Procedure(s) Performed: LAPAROSCOPIC BILATERAL OVARIAN CYSTECTOMIES (Bilateral Abdomen)  Patient Location: PACU  Anesthesia Type:General  Level of Consciousness: drowsy  Airway & Oxygen Therapy: Patient Spontanous Breathing and Patient connected to face mask oxygen  Post-op Assessment: Report given to RN and Post -op Vital signs reviewed and stable  Post vital signs: Reviewed and stable  Last Vitals:  Vitals Value Taken Time  BP 116/69 03/28/20 1515  Temp    Pulse 76 03/28/20 1516  Resp 19 03/28/20 1516  SpO2 100 % 03/28/20 1516  Vitals shown include unvalidated device data.  Last Pain:  Vitals:   03/28/20 1228  TempSrc:   PainSc: 4       Patients Stated Pain Goal: 0 (03/28/20 1228)  Complications: No complications documented.

## 2020-03-28 NOTE — Discharge Instructions (Addendum)
Laparoscopic Surgery Discharge Instructions ° ° °You have just undergone a  laparoscopic surgery.  The following list should answer your most common questions.  Although we will discuss your surgery and post-operative instructions with you prior to your discharge, this list will serve as a reminder if you fail to recall the details of what we discussed. ° °We will discuss your surgery once again in detail at your post-op visit in two to four weeks. If you haven’t already done so, please call to make your appointment as soon as possible. ° °How you will feel: Although you have just undergone a major surgery, your recovery will be significantly shorter since the surgery was performed through much smaller incisions than the traditional approach.  You should feel slightly better each day.  If you suddenly feel much worse than the prior day, please call the clinic.  It’s important during the early part of your recovery that you maintain some activity.  Walking is encouraged.  You will quicken your recovery by continued activity. ° °Incision:  Your incisions will be closed with dissolvable stitches or surgical adhesive (glue).  There may be Band-aids and/or Steri-strips covering your incisions.  If there is no drainage from the incisions you may remove the Band-aids in one to two days.  You may notice some minor bruising at the incision sites.  This is common and will resolve within several days.  Please inform us if the redness at the edges of your incision appears to be spreading.  If the skin around your incision becomes warm to the touch, or if you notice a pus-like drainage, please call the office. ° °Stairs/Driving/Activities: You may climb stairs if necessary.  If you’ve had general anesthesia, do not drive a car the rest of the day today.  You may begin light housework when you feel up to it, but avoid heavy lifting (more than 15-20lbs) or pushing until cleared for these activities by your physician. ° °Hygiene:   Do not soak your incisions.  Showers are acceptable but you may not take a bath or swim in a pool.  Cleanse your incisions daily with soap and water. ° °Medications:  Please resume taking any medications that you were taking prior to the surgery.  If we have prescribed any new medications for you, please take them as directed. ° °Constipation:  It is fairly common to experience some difficulty in moving your bowels following major surgery.  Being active will help to reduce this likelihood. A diet rich in fiber and plenty of liquids is desirable.  If you do become constipated, a mild laxative such as Miralax, Milk of Magnesia, or Metamucil, or a stool softener such as Colace, is recommended. ° °General Instructions: If you develop a fever of 100.5 degrees or higher, please call the office number(s) below for physician on call. ° ° ° °

## 2020-03-28 NOTE — Brief Op Note (Signed)
03/28/2020  3:01 PM  PATIENT:  Penny Becker  29 y.o. female  PRE-OPERATIVE DIAGNOSIS:  LFT OVARIAN CYST  POST-OPERATIVE DIAGNOSIS:  LEFT AND RIGHT OVARIAN cysts  PROCEDURE:  Laparoscopic bilateral ovarian cystectomies SURGEON:  Surgeon(s) and Role:    * Penny Bing, MD - Primary    * Penny Staggers, MD - Assisting  ANESTHESIA:   general and local  EBL:  11mL   BLOOD ADMINISTERED:none  DRAINS: indwelling foley 50mL UOP   LOCAL MEDICATIONS USED:  MARCAINE     SPECIMEN:  Right and left ovarian cyst walls  DISPOSITION OF SPECIMEN:  PATHOLOGY  COUNTS:  YES  TOURNIQUET:  * No tourniquets in log *  DICTATION: .Note written in EPIC  PLAN OF CARE: Discharge to home after PACU  PATIENT DISPOSITION:  PACU - hemodynamically stable.   Delay start of Pharmacological VTE agent (>24hrs) due to surgical blood loss or risk of bleeding: not applicable  Penny Copa MD Attending [facility] (Faculty Practice) 03/28/2020 Time: [phone]

## 2020-03-28 NOTE — Anesthesia Procedure Notes (Signed)
Procedure Name: Intubation Date/Time: 03/28/2020 1:43 PM Performed by: Bryson Corona, CRNA Pre-anesthesia Checklist: Patient identified, Emergency Drugs available, Suction available and Patient being monitored Patient Re-evaluated:Patient Re-evaluated prior to induction Oxygen Delivery Method: Circle system utilized Preoxygenation: Pre-oxygenation with 100% oxygen Induction Type: IV induction Ventilation: Mask ventilation without difficulty Laryngoscope Size: Mac and 3 Grade View: Grade I Tube type: Oral Tube size: 7.0 mm Number of attempts: 1 Airway Equipment and Method: Stylet and Oral airway Placement Confirmation: ETT inserted through vocal cords under direct vision,  positive ETCO2 and breath sounds checked- equal and bilateral Secured at: 22 cm Tube secured with: Tape Dental Injury: Teeth and Oropharynx as per pre-operative assessment

## 2020-03-28 NOTE — Anesthesia Postprocedure Evaluation (Signed)
Anesthesia Post Note  Patient: Penny Becker  Procedure(s) Performed: LAPAROSCOPIC BILATERAL OVARIAN CYSTECTOMIES (Bilateral Abdomen)     Patient location during evaluation: PACU Anesthesia Type: General Level of consciousness: awake and alert Pain management: pain level controlled Vital Signs Assessment: post-procedure vital signs reviewed and stable Respiratory status: spontaneous breathing, nonlabored ventilation, respiratory function stable and patient connected to nasal cannula oxygen Cardiovascular status: blood pressure returned to baseline and stable Postop Assessment: no apparent nausea or vomiting Anesthetic complications: no   No complications documented.  Last Vitals:  Vitals:   03/28/20 1221 03/28/20 1515  BP: 133/65   Pulse: (!) 58   Resp: 18 (P) 15  Temp: 36.4 C (P) 36.8 C  SpO2: 100%     Last Pain:  Vitals:   03/28/20 1228  TempSrc:   PainSc: 4                  Shaindel Sweeten DAVID

## 2020-03-28 NOTE — Interval H&P Note (Signed)
History and Physical Interval Note:  03/28/2020 12:56 PM  Penny Becker  has presented today for surgery, with the diagnosis of left ovarian cyst  The various methods of treatment have been discussed with the patient and family. After consideration of risks, benefits and other options for treatment, the patient has consented to  laparoscopic left ovarian cystectomy as a surgical intervention. I d/w her potential for oophorectomy.   The patient's history has been reviewed, patient examined, no change in status, stable for surgery.  I have reviewed the patient's chart and labs.  Questions were answered to the patient's satisfaction.  Can proceed when OR is ready  [name] MD Attending Center for [employer] [facility])

## 2020-03-28 NOTE — Op Note (Signed)
Operative Note   03/28/2020  PRE-OP DIAGNOSIS: 6-7cm left ovarian cyst seen on ultrasound. Lower pelvic pain   POST-OP DIAGNOSIS: Bilateral ovarian cysts: 6-7cm left ovarian cyst (simple appearing) and 4-5cm right ovarian cyst (simple appearing)   SURGEON: Surgeon(s) and Role:    * [place] Bing, MD - Primary  ASSISTANT:    * Hermina Staggers, MD - Assisting  An experienced assistant was required given the standard of surgical care given the complexity of the case.  This assistant was needed for exposure, dissection, suctioning, retraction, instrument exchange, and for overall help during the procedure   ANESTHESIA: General and local   PROCEDURE: laparoscopic bilateral ovarian cystectomies  ESTIMATED BLOOD LOSS: 34mL  DRAINS: indwelling foley. UOP 39mL  TOTAL IV FLUIDS: per anesthesia ntoe  SPECIMENS: right and left ovarian cyst walls   VTE PROPHYLAXIS: SCDs to the bilateral lower extremities  ANTIBIOTICS: not indicated  COMPLICATIONS: None  DISPOSITION: PACU - hemodynamically stable.  CONDITION: stable  FINDINGS: Exam under anesthesia revealed an anteverted uterus approximately 6-8 week size, normal shape, and no adnexal masses. Patient sounded to 8cm. Laparoscopic survey of the abdomen revealed a grossly normal uterus, liver, gallbladder edge and stomach edge; no intra-abdominal adhesions were noted  Left ovary was 6-7cm in size with simple appearing cyst. During dissection, cyst ruptured with clear fluid. Right ovary was 5-6cm in size with simple appearing cyst. During dissection, cyst ruptured with clear fluid.   PROCEDURE IN DETAIL: The patient was taken to the OR where anesthesia was administed. The patient was positioned in dorsal lithotomy in the [place] stirrups. The patient was then examined under anesthesia with the above noted findings. The patient was prepped and draped in the normal sterile fashion and foley catheter was placed. A Graves speculum was placed in  the vagina and the anterior lip of the cervix was grasped with a single toothed tenaculum.  A Hulka uterine manipulator was then inserted in the uterus and uterine mobility was found to be satisfactory; the speculum and tenaculum were then removed.  After changing gloves, attention was turned to the patient's abdomen where a 12 mm skin incision was made in the umbilical fold, after injection of local anesthesia. Using the open technique, the abdomen was entered and the balloon trocar placed. The abdomen was insufflated to and the operative laparoscope introduced; no injury was noted at the entry site. The upper abdomen was then inspected with the above noted findings. The patient was then placed into trendelenburg and the lower pelvis inspected with the above noted findings. Under direct visualization, 33mm ports were placed, after injection of local anesthesia, at the RLQ, LLQ and suprapubic area.   The right ovary was grasped at the stroma and a circumferential incision made with cut current with the [facility] dissectors. The ovarian tissue was then dissected off the ovarian cyst but was ruptured at that time; the cyst was then removed with traction and counter traction with pieces removed and sent to pathology.  The same procedure was done on the left. There was minimal oozing from both operative beds that were controlled with cautery on coag current. The pressure in the abdomen was then lowered to and all operative sites inspected and no bleeding noted.  The abdomen was then re insufflated and the fluid from the lower cul de sac removed with suction.  Under direct visualization, the ports were then removed and the abdomen de insufflated and the balloon trocar then removed.  The fascia at the  umbilical incision was reapproximated with 0 vicryl. The skin was closed with 4-0 moncryl there and dermabond there and at the other sites. The Hulka was removed with no bleeding noted from the cervix and all  other instrumentation was removed from the vagina.  The foley catheter was removed. The patient tolerated the procedure well. All counts were correct x 2. The patient was transferred to the recovery room awake, alert and breathing independently.   [name] MD Attending Center for [employer] [facility])

## 2020-03-28 NOTE — Anesthesia Preprocedure Evaluation (Addendum)
Anesthesia Evaluation  Patient identified by MRN, date of birth, ID band Patient awake    Reviewed: Allergy & Precautions, NPO status , Patient's Chart, lab work & pertinent test results  Airway Mallampati: I  TM Distance: >3 FB Neck ROM: Full    Dental no notable dental hx. (+) Chipped, Dental Advisory Given,    Pulmonary neg pulmonary ROS, Patient abstained from smoking., former smoker,    Pulmonary exam normal breath sounds clear to auscultation       Cardiovascular Normal cardiovascular exam Rhythm:Regular Rate:Normal     Neuro/Psych negative neurological ROS  negative psych ROS   GI/Hepatic Neg liver ROS, GERD  Medicated and Controlled,  Endo/Other  negative endocrine ROS  Renal/GU negative Renal ROS  negative genitourinary   Musculoskeletal negative musculoskeletal ROS (+)   Abdominal   Peds  Hematology negative hematology ROS (+)   Anesthesia Other Findings Left ovarian cyst  Reproductive/Obstetrics                            Anesthesia Physical Anesthesia Plan  ASA: II  Anesthesia Plan: General   Post-op Pain Management:    Induction: Intravenous  PONV Risk Score and Plan: 3 and Midazolam, Dexamethasone and Ondansetron  Airway Management Planned: Oral ETT  Additional Equipment:   Intra-op Plan:   Post-operative Plan: Extubation in OR  Informed Consent: I have reviewed the patients History and Physical, chart, labs and discussed the procedure including the risks, benefits and alternatives for the proposed anesthesia with the patient or authorized representative who has indicated his/her understanding and acceptance.     Dental advisory given  Plan Discussed with: CRNA  Anesthesia Plan Comments:        Anesthesia Quick Evaluation

## 2020-03-29 ENCOUNTER — Encounter (HOSPITAL_COMMUNITY): Payer: Self-pay | Admitting: Obstetrics and Gynecology

## 2020-03-29 LAB — SURGICAL PATHOLOGY

## 2020-03-29 NOTE — Addendum Note (Signed)
Addendum  created 03/29/20 2231 by Arta Bruce, MD   Attestation recorded in Intraprocedure, Intraprocedure Attestations filed

## 2020-04-04 ENCOUNTER — Ambulatory Visit (INDEPENDENT_AMBULATORY_CARE_PROVIDER_SITE_OTHER): Payer: Medicaid Other | Admitting: *Deleted

## 2020-04-04 ENCOUNTER — Other Ambulatory Visit: Payer: Self-pay

## 2020-04-04 ENCOUNTER — Ambulatory Visit: Payer: Self-pay | Admitting: *Deleted

## 2020-04-04 VITALS — BP 131/81 | HR 76

## 2020-04-04 DIAGNOSIS — N83292 Other ovarian cyst, left side: Secondary | ICD-10-CM

## 2020-04-04 IMAGING — US US OB TRANSVAGINAL
1 series · 15 of 19 positions shown · non-contrast
Comparison: 08/11/2017 obstetric scan.

CLINICAL DATA: 26-year-old pregnant female presents for follow-up
of threatened miscarriage. EDC by LMP: 04/10/2018, projecting to an
expected gestational age of 9 weeks 0 days.

EXAM:
TRANSVAGINAL OB ULTRASOUND
TECHNIQUE: Transvaginal ultrasound was performed for complete evaluation of the
gestation as well as the maternal uterus, adnexal regions, and
pelvic cul-de-sac.

[Series 1: us ob transvaginal · 15 of 19 slices shown]
[im 1/19]
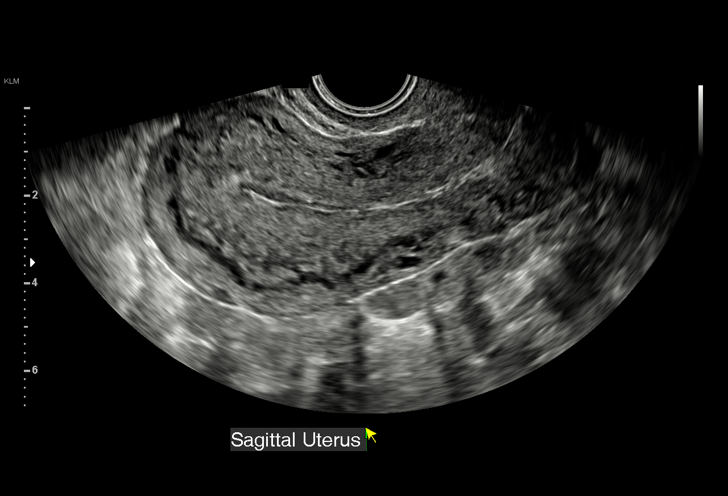
[im 2/19]
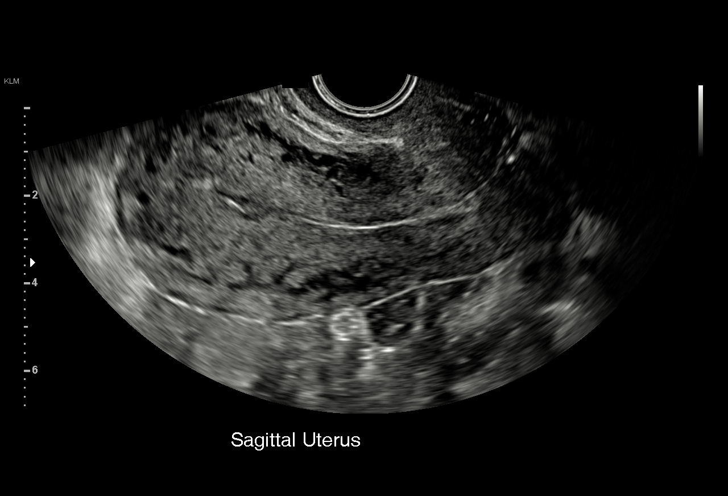
[im 4/19]
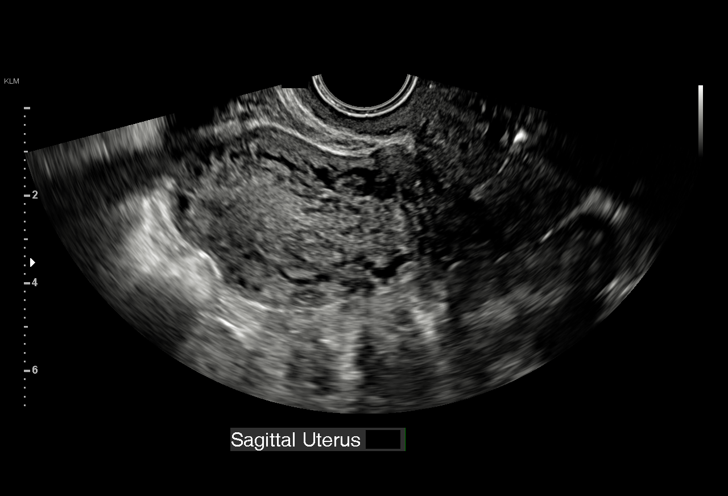
[im 5/19]
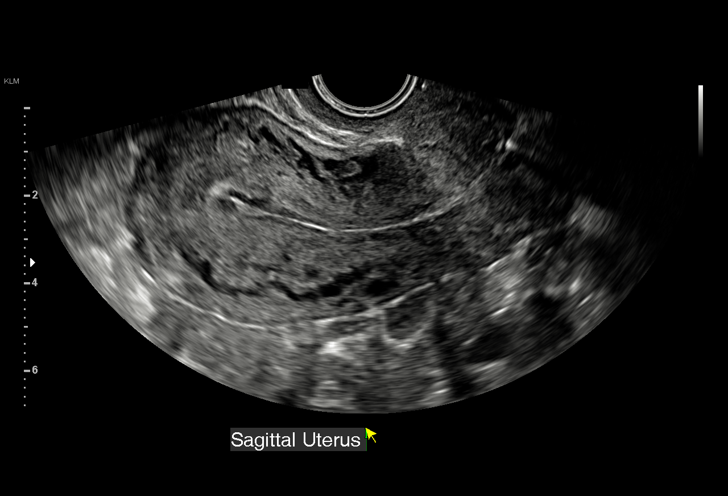
[im 6/19]
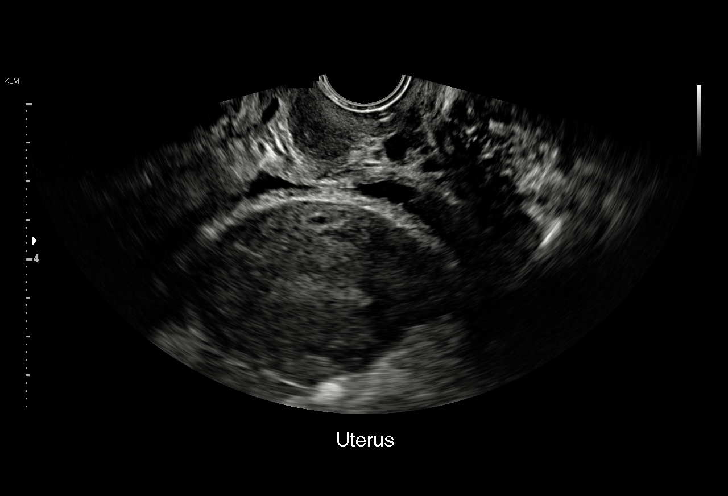
[im 7/19]
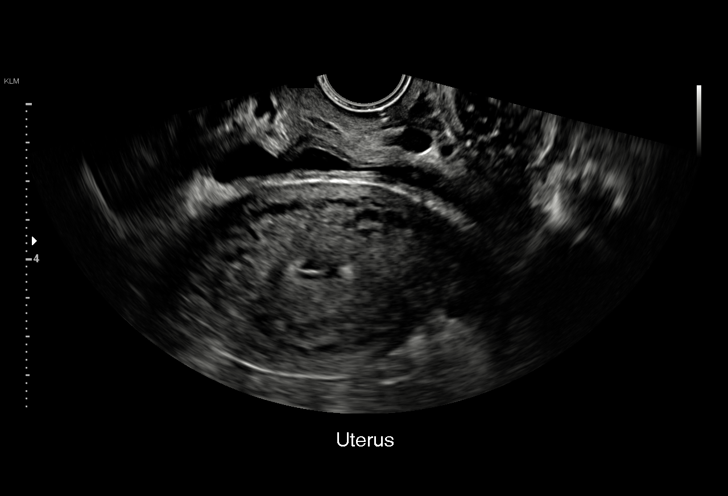
[im 9/19]
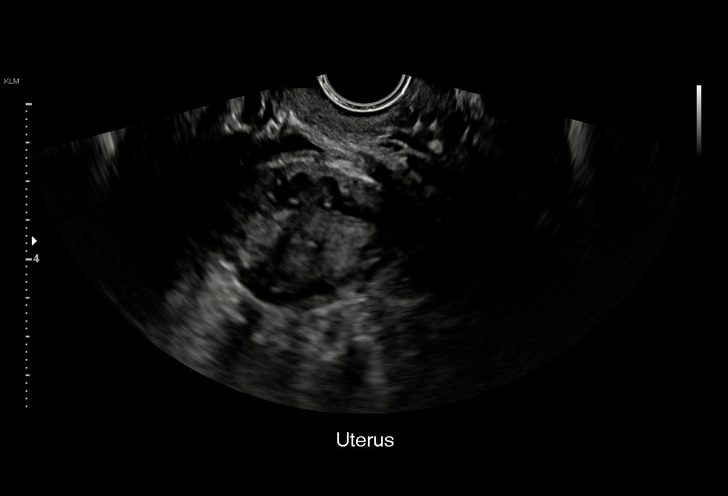
[im 10/19]
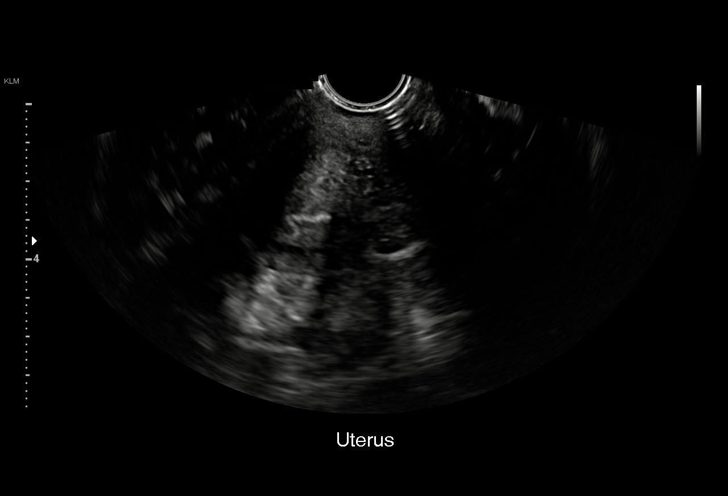
[im 11/19]
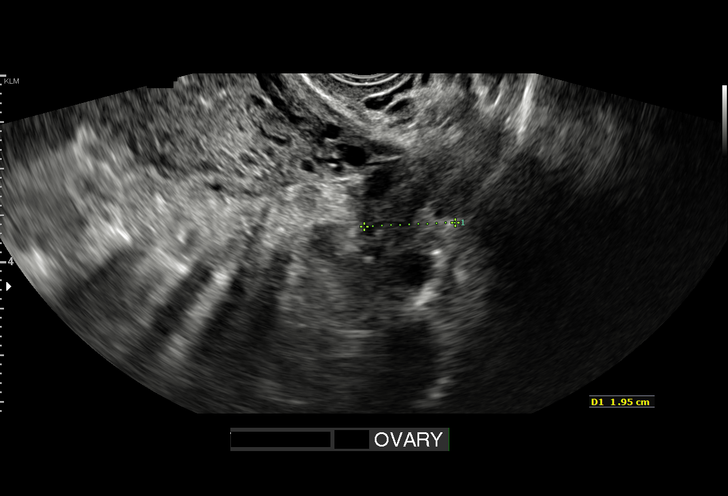
[im 13/19]
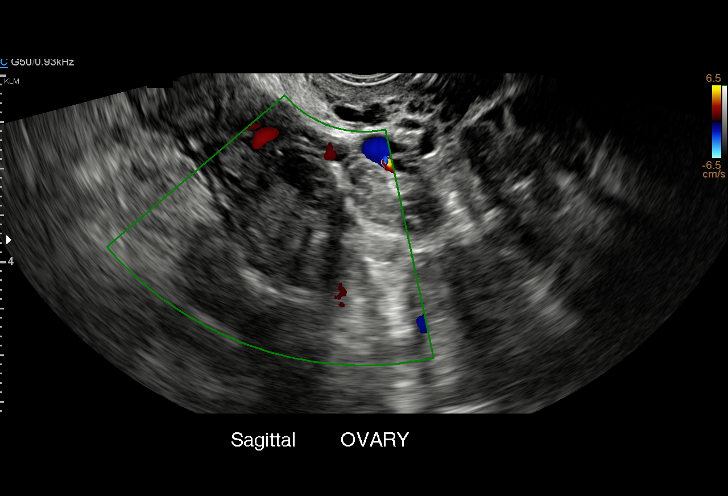
[im 14/19]
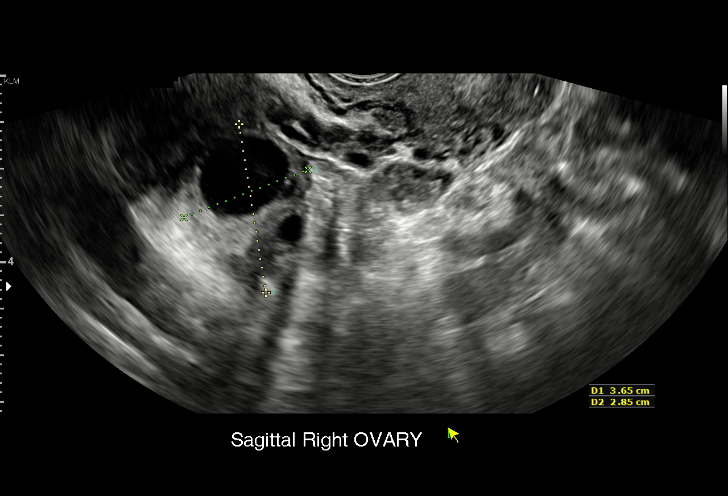
[im 15/19]
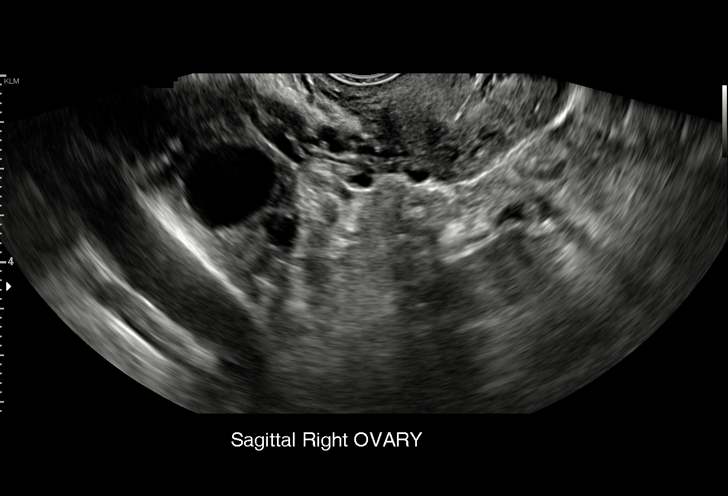
[im 16/19]
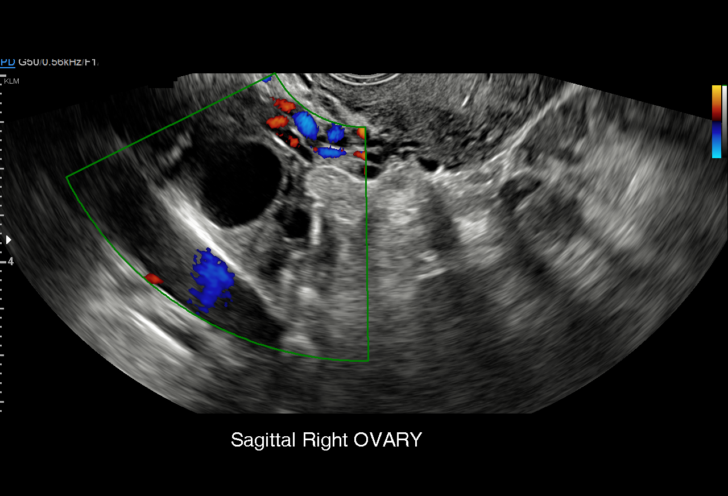
[im 18/19]
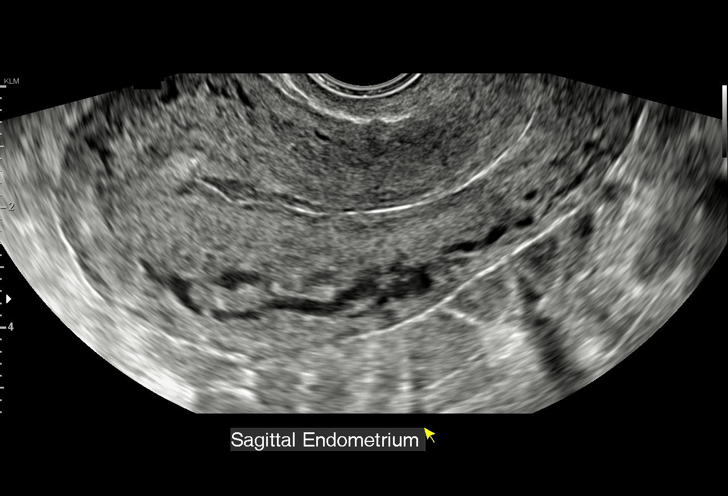
[im 19/19]
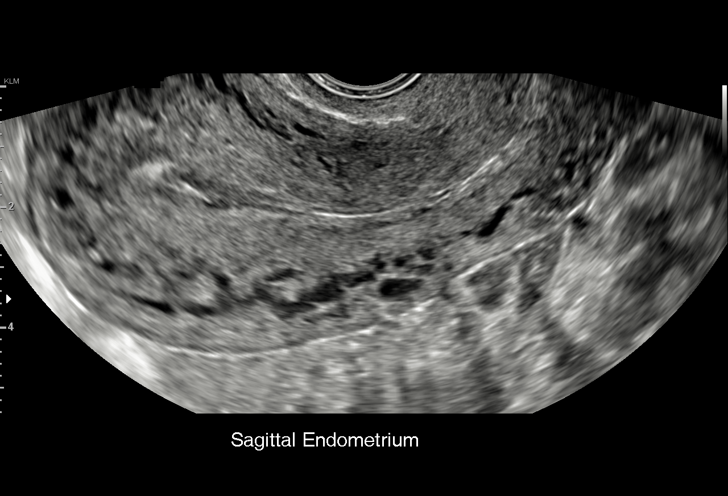

[15 of 19 positions shown; findings below may reference images not displayed]

FINDINGS: Anteverted uterus is normal in size and configuration, with no
uterine fibroids or other myometrial abnormalities. No intrauterine
gestational sac. Bilayer endometrial thickness 6 mm. No endometrial
cavity fluid or focal endometrial mass.

Left ovary measures 3.2 x 2.6 x 2.0 cm. Right ovary measures 3.7 x
2.9 x 1.8 cm. No suspicious ovarian or adnexal masses. No abnormal
free fluid in the pelvis.
IMPRESSION: Previously visualized intrauterine gestational sac is absent on this
scan, compatible with interval spontaneous abortion. No evidence of
retained products of conception. No ovarian or adnexal abnormality.

These results will be called to the ordering clinician or
representative by the Radiologist Assistant, and communication
documented in the PACS or zVision Dashboard.

## 2020-04-04 NOTE — Telephone Encounter (Signed)
Had surgery rt side abdominal incision as gle released started to bleed. I have noticed and bulging line on my abdomin Could it be bleeding under the skin. Patient states that bleeding did not continue from the site.  She is sore and the area is discolored.  Patient was told that some bruising and soreness would be normal post op but she was advised to call the surgeons office for the expert advice and his/her recommendations.  Patient verbalized understanding of all information and will call surgeons office for instructions.  Reason for Disposition . [1] Caller requesting NON-URGENT health information AND [2] PCP's office is the best resource  Answer Assessment - Initial Assessment Questions 1. REASON FOR CALL or QUESTION: "What is your reason for calling today?" or "How can I best help you?" or "What question do you have that I can help answer?"     Had surgery rt side abdominal incision as gle released started to bleed. I have noticed and bulging line on my abdomin Could it be bleeding under the skin.  Protocols used: INFORMATION ONLY CALL - NO TRIAGE-A-AH

## 2020-04-04 NOTE — Progress Notes (Signed)
Patient was assessed and managed by nursing staff during this encounter. I have reviewed the chart and agree with the documentation and plan. I have also made any necessary editorial changes.  Zannah Melucci, MD 04/04/2020 4:42 PM 

## 2020-04-04 NOTE — Progress Notes (Signed)
Subjective:     Penny Becker is a 29 y.o. female who presents to the clinic 1 week status post bilateral ovarian cystectomy for an incision check on her port sites. Pt was concerned she had some bleeding and swelling.   Objective:    LMP 03/12/2020  Incision:   no erythema, no hernia, no seroma, no swelling, no dehiscence, incision well approximated     Assessment:    Doing well postoperatively.   Plan:    1. Wound care discussed. 2. Follow up: as needed and or at next visit.

## 2020-04-18 ENCOUNTER — Other Ambulatory Visit: Payer: Self-pay

## 2020-04-18 ENCOUNTER — Telehealth (INDEPENDENT_AMBULATORY_CARE_PROVIDER_SITE_OTHER): Payer: Medicaid Other | Admitting: Obstetrics and Gynecology

## 2020-04-18 ENCOUNTER — Encounter: Payer: Self-pay | Admitting: Obstetrics and Gynecology

## 2020-04-18 DIAGNOSIS — Z09 Encounter for follow-up examination after completed treatment for conditions other than malignant neoplasm: Secondary | ICD-10-CM

## 2020-04-18 NOTE — Progress Notes (Signed)
TELEHEALTH VIRTUAL GYNECOLOGY VISIT ENCOUNTER NOTE  I connected with [name] on 04/18/20 at 10:15 AM EST by telephone at home and verified that I am speaking with the correct person using two identifiers.   I discussed the limitations, risks, security and privacy concerns of performing an evaluation and management service by telephone and the availability of in person appointments. I also discussed with the patient that there may be a patient responsible charge related to this service. The patient expressed understanding and agreed to proceed.  Chief Complaint: regular post op check   History:  Penny Becker is a 29 y.o. [phone] being evaluated today for above CC.  Patient is doing well and w/o complaint. She had an incision right after surgery and exp management recommended. Pt is not having any incisional or pain issues. Pre surgery pain is gone.     Past Medical History:  Diagnosis Date   Abnormal Pap smear    Anemia    1st pregnancy, tx'd with iron   Chlamydia    Chronic hypertension during pregnancy, antepartum 02/16/2017   In review of patient's chart with Dr. Adrian Blackwater, patient has GHTN and will be induced at 37 weeks on 02/22/17.   Patient had elevated BP in MAU on 02-16-2017 and in clinic on 02-07-2017.   [x]  Aspirin 81 mg daily after 12 weeks; discontinue after 36 weeks Current antihypertensives:  None   Baseline and surveillance labs (pulled in from [facility], refresh links as needed)  Lab Results Component Value Date    GERD (gastroesophageal reflux disease)    during pregnancy   Gestational hypertension w/o significant proteinuria in 3rd trimester 03/21/2011   12/24 24hr urine protein 64mg     History of recurrent UTIs last one 2 months ago   Hx of preeclampsia, prior pregnancy, currently pregnant 08/19/2016   PEC in 2009 IOL at 37 weeks   Prior pregnancy complicated by [facility], antepartum    during 1st pregnancy   Subchorionic hemorrhage 10/22/10   Past Surgical  History:  Procedure Laterality Date   DILATION AND CURETTAGE OF UTERUS     INDUCED ABORTION     2010, 2011   LAPAROSCOPIC OVARIAN CYSTECTOMY Bilateral 03/28/2020   Procedure: LAPAROSCOPIC BILATERAL OVARIAN CYSTECTOMIES;  Surgeon: 2012, MD;  Location: [facility] OR;  Service: Gynecology;  Laterality: Bilateral;   The following portions of the patient's history were reviewed and updated as appropriate: allergies, current medications, past family history, past medical history, past social history, past surgical history and problem list.   Review of Systems:  Pertinent items noted in HPI and remainder of comprehensive ROS otherwise negative.  Physical Exam:   General:  Alert, oriented and cooperative.   Mental Status: Normal mood and affect perceived. Normal judgment and thought content.  Physical exam deferred due to nature of the encounter  Labs and Imaging No results found for this or any previous visit (from the past 336 hour(s)). No results found.    Assessment and Plan:     1. Postop check D/w her re: surgical findings and pathology. She hasn't had issues in the past so I told her it's not necessarily imperative but it may help prevent future ones, as the nexplanon doesn't consistently block ovulation and cysts. I told her that depo provera, combined pills, patch or ring would work. Pt to let [place] Bing know if she'd like to try something. I told her no need for further follow up unless she has s/s and then to let [facility] know.  RTC: PRN  I discussed the assessment and treatment plan with the patient. The patient was provided an opportunity to ask questions and all were answered. The patient agreed with the plan and demonstrated an understanding of the instructions.   The patient was advised to call back or seek an in-person evaluation/go to the ED if the symptoms worsen or if the condition fails to improve as anticipated.  I provided 15 minutes of non-face-to-face time during this  encounter. The visit was done via a MyChart visit.    [place] Bing, MD Center for [employer], [facility]

## 2020-04-18 NOTE — Progress Notes (Signed)
I connected with  Penny Becker on 04/18/20 at 10:15 AM EST by telephone and verified that I am speaking with the correct person using two identifiers.   I discussed the limitations, risks, security and privacy concerns of performing an evaluation and management service by telephone and the availability of in person appointments. I also discussed with the patient that there may be a patient responsible charge related to this service. The patient expressed understanding and agreed to proceed.  Scheryl Marten, RN 04/18/2020  10:28 AM

## 2021-01-10 ENCOUNTER — Ambulatory Visit: Payer: Medicaid Other | Admitting: Podiatry

## 2021-01-28 ENCOUNTER — Other Ambulatory Visit: Payer: Self-pay

## 2021-01-28 ENCOUNTER — Emergency Department
Admission: EM | Admit: 2021-01-28 | Discharge: 2021-01-28 | Disposition: A | Discharge status: Home / Self Care | Payer: Medicaid Other | Attending: Emergency Medicine | Admitting: Emergency Medicine

## 2021-01-28 ENCOUNTER — Emergency Department: Payer: Medicaid Other

## 2021-01-28 DIAGNOSIS — R202 Paresthesia of skin: Secondary | ICD-10-CM | POA: Diagnosis not present

## 2021-01-28 DIAGNOSIS — Z87891 Personal history of nicotine dependence: Secondary | ICD-10-CM | POA: Insufficient documentation

## 2021-01-28 DIAGNOSIS — F419 Anxiety disorder, unspecified: Secondary | ICD-10-CM | POA: Diagnosis not present

## 2021-01-28 DIAGNOSIS — N9489 Other specified conditions associated with female genital organs and menstrual cycle: Secondary | ICD-10-CM | POA: Insufficient documentation

## 2021-01-28 DIAGNOSIS — R002 Palpitations: Secondary | ICD-10-CM

## 2021-01-28 DIAGNOSIS — R519 Headache, unspecified: Secondary | ICD-10-CM | POA: Insufficient documentation

## 2021-01-28 DIAGNOSIS — R079 Chest pain, unspecified: Secondary | ICD-10-CM | POA: Diagnosis not present

## 2021-01-28 DIAGNOSIS — R0602 Shortness of breath: Secondary | ICD-10-CM | POA: Insufficient documentation

## 2021-01-28 LAB — CBC
HCT: 40.6 % (ref 36.0–46.0)
Hemoglobin: 13.6 g/dL (ref 12.0–15.0)
MCH: 30.5 pg (ref 26.0–34.0)
MCHC: 33.5 g/dL (ref 30.0–36.0)
MCV: 91 fL (ref 80.0–100.0)
Platelets: 248 10*3/uL (ref 150–400)
RBC: 4.46 MIL/uL (ref 3.87–5.11)
RDW: 11.6 % (ref 11.5–15.5)
WBC: 7.8 10*3/uL (ref 4.0–10.5)
nRBC: 0 % (ref 0.0–0.2)

## 2021-01-28 LAB — BASIC METABOLIC PANEL
Anion gap: 9 (ref 5–15)
BUN: 9 mg/dL (ref 6–20)
CO2: 27 mmol/L (ref 22–32)
Calcium: 9 mg/dL (ref 8.9–10.3)
Chloride: 102 mmol/L (ref 98–111)
Creatinine, Ser: 0.74 mg/dL (ref 0.44–1.00)
GFR, Estimated: >60 mL/min (ref >60)
Glucose, Bld: 98 mg/dL (ref 70–99)
Potassium: 3.9 mmol/L (ref 3.5–5.1)
Sodium: 138 mmol/L (ref 135–145)

## 2021-01-28 LAB — TROPONIN I (HIGH SENSITIVITY): Troponin I (High Sensitivity): 2 ng/L (ref <18)

## 2021-01-28 LAB — HCG, QUANTITATIVE, PREGNANCY: hCG, Beta Chain, Quant, S: <1 m[IU]/mL (ref <5)

## 2021-01-28 LAB — TSH: TSH: 1.228 u[IU]/mL (ref 0.350–4.500)

## 2021-01-28 MED ORDER — ACETAMINOPHEN 325 MG PO TABS
650.0000 mg | ORAL_TABLET | Freq: Once | ORAL | Status: AC
  Administered 2021-01-28: 650 mg via ORAL
  Filled 2021-01-28: qty 2

## 2021-01-28 NOTE — ED Notes (Signed)
See triage note. Pt ambulatory to room. Pt c/o centralized CP and tachycardia that started last night. Pt states upon standing HR increased to 120s. Pt states hx anxiety and unsure if related. PT also states she has been having intermittent numbness and tingling in the right side of her face and fingertips and toes. VSS at this time.

## 2021-01-28 NOTE — ED Provider Notes (Signed)
[facility] Emergency Department Provider Note ____________________________________________  Time seen: 1100  I have reviewed the triage vital signs and the nursing notes.  HISTORY  Chief Complaint  Chest Pain   HPI Penny Becker is a 30 y.o. female presents to the ER today with complaint of chest pain, shortness of breath and palpitations.  She reports this started last night.  She reports the symptoms woke her up out of her sleep.  She reports the symptoms have been intermittent but have seem to be resolved at this time.  She does have a slight headache but denies runny nose, nasal congestion, ear pain, sore throat, cough.  She does intermittently have numbness and tingling in her hands and feet.  She reports she had an episode of numbness and tingling that started in her right ear and radiated down the right side of her face last week but this seems to have resolved.  She is concerned about MS as her mother has a history of this.  She has been meaning to go to her PCP for work-up for this but has not had time.  She does have a history of anxiety and mild panic however she reports this feels different.  She reports her PCP started her on medication for anxiety but she did not take it.  She has not had any heartburn or reflux.  She did try some Rolaids OTC with minimal relief of symptoms.  Past Medical History:  Diagnosis Date   Abnormal Pap smear    Anemia    1st pregnancy, tx'd with iron   Chlamydia    Chronic hypertension during pregnancy, antepartum 02/16/2017   In review of patient's chart with Dr. Adrian Blackwater, patient has GHTN and will be induced at 37 weeks on 02/22/17.   Patient had elevated BP in MAU on 02-16-2017 and in clinic on 02-07-2017.   [x]  Aspirin 81 mg daily after 12 weeks; discontinue after 36 weeks Current antihypertensives:  None   Baseline and surveillance labs (pulled in from [facility], refresh links as needed)  Lab Results Component Value Date    GERD  (gastroesophageal reflux disease)    during pregnancy   Gestational hypertension w/o significant proteinuria in 3rd trimester 03/21/2011   12/24 24hr urine protein 64mg     History of recurrent UTIs last one 2 months ago   Hx of preeclampsia, prior pregnancy, currently pregnant 08/19/2016   PEC in 2009 IOL at 37 weeks   Prior pregnancy complicated by [facility], antepartum    during 1st pregnancy   Subchorionic hemorrhage 10/22/10    Patient Active Problem List   Diagnosis Date Noted   Complex cyst of left ovary 03/16/2020   Constipation 03/02/2017    Past Surgical History:  Procedure Laterality Date   DILATION AND CURETTAGE OF UTERUS     INDUCED ABORTION     2010, 2011   LAPAROSCOPIC OVARIAN CYSTECTOMY Bilateral 03/28/2020   Procedure: LAPAROSCOPIC BILATERAL OVARIAN CYSTECTOMIES;  Surgeon: 2012, MD;  Location: [facility] OR;  Service: Gynecology;  Laterality: Bilateral;    Prior to Admission medications   Medication Sig Start Date End Date Taking? Authorizing Provider  Etonogestrel (IMPLANON Cunningham) Inject into the skin.    [provider]  ibuprofen (ADVIL) 200 MG tablet Take 3 tablets (600 mg total) by mouth every 6 (six) hours as needed for headache, mild pain or cramping. 03/28/20   [facility], MD  loratadine (CLARITIN) 10 MG tablet Take 10 mg by mouth daily as needed for  allergies or itching (rash).    [provider]  oxyCODONE-acetaminophen (PERCOCET/ROXICET) 5-325 MG tablet Take 1 tablet by mouth every 6 (six) hours as needed. Patient not taking: Reported on 04/18/2020 03/28/20   Creola Bing, MD  promethazine (PHENERGAN) 25 MG tablet Take 1 tablet (25 mg total) by mouth every 6 (six) hours as needed for nausea or vomiting. 03/16/20   Leslie Bing, MD    Allergies Patient has no known allergies.  Family History  Problem Relation Age of Onset   Heart disease Maternal Grandmother    Liver disease Maternal Grandmother    Heart disease Maternal  Grandfather    Liver disease Maternal Grandfather    Schizophrenia Paternal Grandmother    Diabetes Paternal Grandmother    Mental illness Paternal Grandmother    Hypertension Father    Kidney disease Daughter        horse shoe kidneys recurrent UTI   Hypertension Mother    Multiple sclerosis Mother    Anesthesia problems Neg Hx     Social History Social History   Tobacco Use   Smoking status: Former    Packs/day: 1.00    Types: Cigarettes    Quit date: 01/20/2016    Years since quitting: 5.0   Smokeless tobacco: Never  Vaping Use   Vaping Use: Some days  Substance Use Topics   Alcohol use: No    Comment: socially   Drug use: No    Review of Systems  Constitutional: Negative for fever, chills or body aches. Eyes: Negative for visual changes. ENT: Negative for runny nose, nasal congestion, ear pain or sore throat. Cardiovascular: Positive for chest pain and palpitations.  Negative for chest tightness. Respiratory: Positive for shortness of breath.  Negative for cough. Gastrointestinal: Negative for abdominal pain, nausea and vomiting. Genitourinary: Negative for dysuria. Skin: Negative for rash. Neurological: Positive for headache, intermittent numbness and tingling of the extremities and face. ____________________________________________  PHYSICAL EXAM:  VITAL SIGNS: ED Triage Vitals  Enc Vitals Group     BP 01/28/21 0853 133/84     Pulse Rate 01/28/21 0853 75     Resp 01/28/21 0853 16     Temp 01/28/21 0853 98.6 F (37 C)     Temp Source 01/28/21 0853 Oral     SpO2 01/28/21 0853 99 %     Weight --      Height 01/28/21 0851 5\' 8"  (1.727 m)     Head Circumference --      Peak Flow --      Pain Score 01/28/21 0851 0     Pain Loc --      Pain Edu? --      Excl. in GC? --     Constitutional: Alert and oriented. Well appearing and in no distress. Head: Normocephalic. Eyes: Normal extraocular movements Cardiovascular: Normal rate, regular rhythm.  Radial  and pedal pulses 2+ bilaterally. Respiratory: Normal respiratory effort. No wheezes/rales/rhonchi. Gastrointestinal: Soft and nontender.  Musculoskeletal: No difficulty with gait. Neurologic:  Normal gait without ataxia. Normal speech and language. No gross focal neurologic deficits are appreciated. Skin:  Skin is warm, dry and intact. No rash noted. Psychiatric: Mildly anxious appearing. ____________________________________________   LABS Labs Reviewed  BASIC METABOLIC PANEL  CBC  HCG, QUANTITATIVE, PREGNANCY  TSH  TSH  POC URINE PREG, ED  TROPONIN I (HIGH SENSITIVITY)    ____________________________________________  EKG ED ECG REPORT   Date: 01/28/2021  EKG Time: 12:24 PM  Rate: 84  Rhythm: normal  sinus rhythm,  normal EKG, normal sinus rhythm  Axis: normal  Intervals: short PR interval  ST&T Change: none  Narrative Interpretation: normal sinus rhythm with short PR interval   ____________________________________________   RADIOLOGY  Imaging Orders         DG Chest 2 View    IMPRESSION: No active cardiopulmonary disease.   ____________________________________________    INITIAL IMPRESSION / ASSESSMENT AND PLAN / ED COURSE  Chest Pain, Shortness of Breath, Palpitations, Intermittent Paresthesias and Anxiety:  DDx include anxiety, panic attack, GERD, gastritis, SVT, new onset thyroid disorder, vitamin deficiency, MS Labs unremarkable, TSH pending ECG negative for acute findings Tylenol 650 mg PO x 1 She is currently asymptomatic Discussed if palpitations reoccur, would recommend Holter monitor Recommend follow up with PCP for further evaluation- they can refer to cardiology if needed for Holter monitor PCP can also obtain imaging to r/o MS if symptoms reoccur ____________________________________________  FINAL CLINICAL IMPRESSION(S) / ED DIAGNOSES  Final diagnoses:  Nonspecific chest pain  Palpitations  Anxiety  Paresthesia of upper and lower  extremities of both sides      Lorre Munroe, NP 01/28/21 1224    Merwyn Katos, MD 01/28/21 1409

## 2021-01-28 NOTE — ED Triage Notes (Addendum)
Pt to ER via POV with complaints of intermittent non-radiating, centralized, chest pain that started last night. Reports the pain continuously woke her from her sleep. Also experienced shortness of breath. Reports elevated heart rates this morning when standing up to 125. Reports history of anxiety and anxiety attacks but states this feels different.   Pt reports similar symptoms last week accompanied with bilateral hand and feet numbness and a "tingling" sensation to right side of face.  At this time patient reports symptoms have subsided.

## 2021-01-28 NOTE — Discharge Instructions (Addendum)
You were seen today for chest pain, palpitations, shortness of breath and numbness and tingling in her upper and lower extremities.  Your chest x-ray was negative for acute findings.  Your EKG did not show any acute findings.  Your labs were unremarkable.  Your thyroid test is pending, we will call you if this is abnormal.  We recommend that you follow-up with your PCP if palpitations, numbness and tingling persist or worsen.  Try to cut back on caffeine use as this may cause heart racing.  This could be related to your anxiety, would consider treatment if your symptoms persist.

## 2021-02-05 ENCOUNTER — Ambulatory Visit: Payer: Medicaid Other | Admitting: Podiatry

## 2021-03-15 ENCOUNTER — Encounter: Payer: Self-pay | Admitting: Physician Assistant

## 2021-03-15 ENCOUNTER — Ambulatory Visit (LOCAL_COMMUNITY_HEALTH_CENTER): Payer: Medicaid Other | Admitting: Advanced Practice Midwife

## 2021-03-15 ENCOUNTER — Ambulatory Visit: Payer: Medicaid Other

## 2021-03-15 ENCOUNTER — Other Ambulatory Visit: Payer: Self-pay

## 2021-03-15 VITALS — BP 121/74 | Ht 67.5 in | Wt 174.2 lb

## 2021-03-15 DIAGNOSIS — Z720 Tobacco use: Secondary | ICD-10-CM | POA: Insufficient documentation

## 2021-03-15 DIAGNOSIS — Z3009 Encounter for other general counseling and advice on contraception: Secondary | ICD-10-CM | POA: Diagnosis not present

## 2021-03-15 DIAGNOSIS — Z3046 Encounter for surveillance of implantable subdermal contraceptive: Secondary | ICD-10-CM | POA: Diagnosis not present

## 2021-03-15 DIAGNOSIS — L503 Dermatographic urticaria: Secondary | ICD-10-CM | POA: Insufficient documentation

## 2021-03-15 LAB — WET PREP FOR TRICH, YEAST, CLUE
Trichomonas Exam: NEGATIVE
Yeast Exam: NEGATIVE

## 2021-03-15 NOTE — Progress Notes (Signed)
Wet prep reviewed, no treatment indicated. Patient scheduled to return for Nexplanon removal/reinsertion and counseled no sex until after that appointment. Patient states understanding.Burt Knack, RN

## 2021-03-15 NOTE — Progress Notes (Signed)
[facility] DEPARTMENT [facility] [address] Main Number: [phone]    Family Planning Visit- Initial Visit  Subjective:  Penny Becker is a 30 y.o. SWF vaper [phone] ([birth year])  being seen today for an initial annual visit and to discuss contraceptive options.  The patient is currently using Nexplanon for pregnancy prevention. Patient reports she does not want a pregnancy in the next year.  Patient has the following medical conditions has Complex cyst of left ovary and Vapes nicotine containing substance on their problem list.  Chief Complaint  Patient presents with   Annual Exam   Contraception    Wants either a replacement or other method    Patient reports here for pap, physical, Nexplanon removal/reinsertion. Nexplanon inserted 03/04/18. LMP 02/2021. Last sex 02/20/21 without condom; with current partner x 5 years; 1 partner in last 3 mo. Last pap 08/19/16 neg (no HPV done). Last physical 12/29/19 at [facility]. Last cig 3 years ago. Currently vapes. Last MJ 5 years ago. Last ETOH 11/2020 (5 shots Jose) "not often". Last dental exam 11/2020. States had Halter monitor recently and has an ECHO  of her heart today.  Also c/o lower back pain , pelvic and low abdominal discomfort off and on relieved with IB onset 1 mo ago. Hx ovarian cysts in past.  Refuses bloodwork because donates plasma. 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 Living with her 3 kids. C/o sore on left labia onset 03/10/21 where she thinks her partner scratched her. Never had this type of sore before. C/o occassional numb toes and feet. Mom has MS.   Patient denies cigar use  Body mass index is 26.88 kg/m. - Patient is eligible for diabetes screening based on BMI and age >67?  not applicable HA1C ordered? not applicable  Patient reports 1  partner/s in last year. Desires STI screening?  No - declines bloodwork  Has patient been screened once for HCV in the past?   No  No results found for: HCVAB  Does the patient have current drug use (including MJ), have a partner with drug use, and/or has been incarcerated since last result? No  If yes-- Screen for HCV through [facility]   Does the patient meet criteria for HBV testing? No  Criteria:  -Household, sexual or needle sharing contact with HBV -History of drug use -HIV positive -Those with known Hep C   Health Maintenance Due  Topic Date Due   COVID-19 Vaccine (1) Never done   Hepatitis C Screening  Never done   PAP SMEAR-Modifier  08/20/2019   INFLUENZA VACCINE  Never done    Review of Systems  Gastrointestinal:  Positive for nausea (onset 01/2021 without vomiting resolved now).  Genitourinary:  Positive for frequency (referred to primary care MD).  Neurological:  Positive for dizziness (evaluated by cardiologist and has resolved now) and headaches (1x/wk always bilateral temples relieved with IB; -N&V,-audio,-vision, -neuro).  All other systems reviewed and are negative.  The following portions of the patient's history were reviewed and updated as appropriate: allergies, current medications, past family history, past medical history, past social history, past surgical history and problem list. Problem list updated.   See flowsheet for other program required questions.  Objective:   Vitals:   03/15/21 1339  BP: 121/74  Weight: 174 lb 3.2 oz (79 kg)  Height: 5' 7.5" (1.715 m)    Physical Exam Constitutional:      Appearance: Normal appearance. She is  normal weight.  HENT:     Head: Normocephalic and atraumatic.     Mouth/Throat:     Mouth: Mucous membranes are moist.     Comments: Last dental exam 11/2020 Eyes:     Conjunctiva/sclera: Conjunctivae normal.  Neck:     Thyroid: No thyroid mass, thyromegaly or thyroid tenderness.  Cardiovascular:     Rate and Rhythm: Normal rate and regular rhythm.  Pulmonary:     Effort: Pulmonary effort is normal.     Breath sounds: Normal  breath sounds.  Chest:  Breasts:    Right: Normal.     Left: Normal.  Abdominal:     Palpations: Abdomen is soft.     Comments: Soft without masses or tenderness, good tone  Genitourinary:    General: Normal vulva.     Exam position: Lithotomy position.     Vagina: Vaginal discharge (small amt white creamy leukorrhea, ph<4.5) present.     Cervix: Normal.     Uterus: Normal.      Adnexa: Right adnexa normal and left adnexa normal.     Rectum: Normal.       Comments: Oval shaped ~3 mm open area on left  labia majora onset 03/10/21. HSV cultures done. Almost healed lesion Musculoskeletal:        General: Normal range of motion.     Cervical back: Normal range of motion and neck supple.  Skin:    General: Skin is warm and dry.  Neurological:     Mental Status: She is alert.  Psychiatric:        Mood and Affect: Mood normal.      Assessment and Plan:  Penny Becker is a 30 y.o. female presenting to the [facility] Department for an initial annual wellness/contraceptive visit  Contraception counseling: Reviewed all forms of birth control options in the tiered based approach. available including abstinence; over the counter/barrier methods; hormonal contraceptive medication including pill, patch, ring, injection,contraceptive implant, ECP; hormonal and nonhormonal IUDs; permanent sterilization options including vasectomy and the various tubal sterilization modalities. Risks, benefits, and typical effectiveness rates were reviewed.  Questions were answered.  Written information was also given to the patient to review.  Patient desires Nexplanon removal/reinsertion, this was prescribed for patient. She will follow up in  1 week for surveillance.  She was told to call with any further questions, or with any concerns about this method of contraception.  Emphasized use of condoms 100% of the time for STI prevention.  Patient was offered ECP. ECP was not accepted by the patient.  ECP counseling was not given - see RN documentation  1. Vapes nicotine containing substance Counseled via 5 A's to stop  2. Family planning Treat wet mount per standing orders Immunization nurse consult Referred to primary care MD for numerous c/o - WET PREP FOR TRICH, YEAST, CLUE - Chlamydia/Gonorrhea [facility] - IGP, Aptima HPV - Virology, [facility]  3. Encounter for surveillance of implantable subdermal contraceptive Please assist pt in scheduling Nexplanon removal/reinsertion appt when convenient; counseled Nexplanon is effective for 4 years but pt desires removal/reinsertion asap. Unable to do today due to lack of time Counsel pt not to have unprotected sex for 5 days before apt     No follow-ups on file.  No future appointments.  Alberteen Spindle, CNM

## 2021-03-21 ENCOUNTER — Encounter: Payer: Self-pay | Admitting: Physician Assistant

## 2021-03-21 DIAGNOSIS — B009 Herpesviral infection, unspecified: Secondary | ICD-10-CM | POA: Insufficient documentation

## 2021-03-22 ENCOUNTER — Telehealth: Payer: Self-pay

## 2021-03-22 ENCOUNTER — Other Ambulatory Visit: Payer: Self-pay | Admitting: Family Medicine

## 2021-03-22 DIAGNOSIS — B009 Herpesviral infection, unspecified: Secondary | ICD-10-CM

## 2021-03-22 MED ORDER — ACYCLOVIR 800 MG PO TABS: 800.0000 mg | ORAL_TABLET | Freq: Every day | ORAL | 12 refills | Status: DC

## 2021-03-22 NOTE — Progress Notes (Signed)
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    RX sent to pharmacy of choice [facility] on [address].    - acyclovir (ZOVIRAX) 800 MG tablet; Take 1 tablet (800 mg total) by mouth daily.  Dispense: 30 tablet; Refill: 12  Wendi Snipes, FNP

## 2021-03-22 NOTE — Telephone Encounter (Signed)
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 Per Sadie Haber, PA written instruction on lab: Offer provider appt to discuss dx and tx options. Or, if pt desires, offer Rx to be sent to pharmacy; pt to specify episodic or suppressive therapy; confirm pharmacy.  Phone call to pt. Pt confirmed password from last visit. Pt counseled about positive HSV result.  Pt desires to talk with provider further about dx and tx options; says she will talk with provider when she comes in for her nexplanon appt next week (03/27/21).  Has a lot of questions about how long she may have had it, her immune system, HSV vs HPV testing and will talk further with provider on 03/27/21. She states she still is having issue with the sore on her labia. She does not want a prescription sent to her pharmacy at this time.

## 2021-03-22 NOTE — Telephone Encounter (Signed)
Pt called back and desires to get started on a medication ASAP. Would like to pick up medication today.  She desires suppressive therapy to take every day to try to prevent outbreaks.  Requests that Rx be sent to [facility] at [address] [place], [place] [address]. (She does not want to use the [facility] pharmacy for this Rx.)  Pt states she will talk with provider further about all her questions and concerns when she comes in on 03/27/21.

## 2021-03-23 ENCOUNTER — Encounter: Payer: Self-pay | Admitting: Advanced Practice Midwife

## 2021-03-23 DIAGNOSIS — R87619 Unspecified abnormal cytological findings in specimens from cervix uteri: Secondary | ICD-10-CM | POA: Insufficient documentation

## 2021-03-23 LAB — IGP, APTIMA HPV
HPV Aptima: NEGATIVE
PAP Smear Comment: 0

## 2021-03-27 ENCOUNTER — Ambulatory Visit (LOCAL_COMMUNITY_HEALTH_CENTER): Payer: Medicaid Other | Admitting: Physician Assistant

## 2021-03-27 ENCOUNTER — Other Ambulatory Visit: Payer: Self-pay

## 2021-03-27 VITALS — BP 124/78 | HR 71 | Ht 67.5 in | Wt 177.0 lb

## 2021-03-27 DIAGNOSIS — Z3009 Encounter for other general counseling and advice on contraception: Secondary | ICD-10-CM | POA: Diagnosis not present

## 2021-03-27 DIAGNOSIS — Z7189 Other specified counseling: Secondary | ICD-10-CM

## 2021-03-28 NOTE — Progress Notes (Signed)
S:  Patient scheduled for a Nexplanon removal/reinsertion today. Patient has questions about the Nexplanon and about recent dx for HSV-2.  States that she has had some diarrhea when she had been taking the Acyclovir, so that she only took it for 3 days.  NKDA. O:  WDWN female in NAD, A&O x 3, normal work of breathing.  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 A/P:  1.  Counseled patient that Nexplanon is effective for up to 4 years so that she can keep her current device for about 1 more year.   2.  Patient with concerns about ovarian cysts and asks if changing Nexplanon would help prevent them.  Counseled that changing may or may not help prevent them since the hormone level is a low, constant one.  3.  Patient opts to continue with current Nexplanon and RTC in about 1 year for removal/reinsertion. 4.  Counseled patient extensively re:  HSV-types, cyclic nature, transmission, subclinical disease and treatment options. 5.  Counseled patient that she can try taking Acyclovir suppressively for about 1 year, then d/c to see how often she may have an outbreak.  Patient has Rx for suppressive treatment already. 6.  Reassured patient that the diarrhea is normal at the onset of taking medicine and will resolve after a few day. 7.  Patient without other questions at this time.  8.  Enc to call with further questions or concerns. Spent approximately 30-35 minutes in face to face counseling time with patient.

## 2021-04-23 ENCOUNTER — Encounter: Payer: Self-pay | Admitting: Emergency Medicine

## 2021-04-23 ENCOUNTER — Ambulatory Visit
Admission: EM | Admit: 2021-04-23 | Discharge: 2021-04-23 | Disposition: A | Discharge status: Home / Self Care | Payer: Medicaid Other | Attending: Family Medicine | Admitting: Family Medicine

## 2021-04-23 ENCOUNTER — Other Ambulatory Visit: Payer: Self-pay

## 2021-04-23 DIAGNOSIS — R109 Unspecified abdominal pain: Secondary | ICD-10-CM | POA: Insufficient documentation

## 2021-04-23 DIAGNOSIS — R1012 Left upper quadrant pain: Secondary | ICD-10-CM | POA: Diagnosis present

## 2021-04-23 LAB — POCT URINALYSIS DIP (MANUAL ENTRY)
Bilirubin, UA: NEGATIVE
Glucose, UA: NEGATIVE mg/dL
Ketones, POC UA: NEGATIVE mg/dL
Nitrite, UA: NEGATIVE
Protein Ur, POC: NEGATIVE mg/dL
Spec Grav, UA: >=1.03 — AB (ref 1.010–1.025)
Urobilinogen, UA: 0.2 E.U./dL
pH, UA: 6 (ref 5.0–8.0)

## 2021-04-23 NOTE — ED Provider Notes (Signed)
[name]    CSN: [id number] Arrival date & time: 04/23/21  1748      History   Chief Complaint Chief Complaint  Patient presents with   Abdominal Pain   Chest Pain    HPI Penny Becker is a 30 y.o. female.   HPI Patient with history of anxiety with panic attacks, GERD, nonspecific chest pain presents today with Left flank pain and left upper quadrant abdominal pain which began today. She has not made any attempts to alleviate the pain. She noticed the pain became more intense today during work. She works as Educational psychologist. Denies any injury or any sudden shifts in movements.  She is not experiencing any associated nausea, vomiting, or diarrhea. Tolerating food and drinking without difficulty.  Past Medical History:  Diagnosis Date   Abnormal Pap smear    Anemia    1st pregnancy, tx'd with iron   Chlamydia    Chronic hypertension during pregnancy, antepartum 02/16/2017   In review of patient's chart with Dr. Nehemiah Settle, patient has GHTN and will be induced at 37 weeks on 02/22/17.   Patient had elevated BP in MAU on 02-16-2017 and in clinic on 02-07-2017.   [x]  Aspirin 81 mg daily after 12 weeks; discontinue after 36 weeks Current antihypertensives:  None   Baseline and surveillance labs (pulled in from [facility], refresh links as needed)  Lab Results Component Value Date    GERD (gastroesophageal reflux disease)    during pregnancy   Gestational hypertension w/o significant proteinuria in 3rd trimester 03/21/2011   12/24 24hr urine protein 64mg     History of recurrent UTIs last one 2 months ago   Hx of preeclampsia, prior pregnancy, currently pregnant 08/19/2016   PEC in 2009 IOL at 37 weeks   Prior pregnancy complicated by [facility], antepartum    during 1st pregnancy   Subchorionic hemorrhage 10/22/10    Patient Active Problem List   Diagnosis Date Noted   Abnormal Pap smear of cervix 03/15/21 03/23/2021   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   Complex cyst of left ovary 03/16/2020    Past Surgical History:  Procedure Laterality Date   DILATION AND CURETTAGE OF UTERUS     INDUCED ABORTION     2010, 2011   LAPAROSCOPIC OVARIAN CYSTECTOMY Bilateral 03/28/2020   Procedure: LAPAROSCOPIC BILATERAL OVARIAN CYSTECTOMIES;  Surgeon: Aletha Halim, MD;  Location: [place];  Service: Gynecology;  Laterality: Bilateral;    OB History     Gravida  8   Para  3   Term  3   Preterm  0   AB  4   Living  3      SAB  2   IAB  2   Ectopic  0   Multiple  0   Live Births  3            Home Medications    Prior to Admission medications   Medication Sig Start Date End Date Taking? Authorizing Provider  acyclovir (ZOVIRAX) 800 MG tablet Take 1 tablet (800 mg total) by mouth daily. Patient not taking: Reported on 03/27/2021 03/22/21   Junious Dresser, FNP  Etonogestrel [facility]) Inject into the skin.    [provider]  ibuprofen (ADVIL) 200 MG tablet Take 3 tablets (600 mg total) by mouth every 6 (six) hours as needed for headache, mild pain or cramping. Patient not taking: Reported on 03/15/2021 03/28/20   Aletha Halim,  MD  loratadine (CLARITIN) 10 MG tablet Take 10 mg by mouth daily as needed for allergies or itching (rash).    [provider]  oxyCODONE-acetaminophen (PERCOCET/ROXICET) 5-325 MG tablet Take 1 tablet by mouth every 6 (six) hours as needed. Patient not taking: Reported on 04/18/2020 03/28/20   [place] Bing, MD  promethazine (PHENERGAN) 25 MG tablet Take 1 tablet (25 mg total) by mouth every 6 (six) hours as needed for nausea or vomiting. Patient not taking: Reported on 03/15/2021 03/16/20   [place] Bing, MD    Family History Family History  Problem Relation Age of Onset   Heart disease Maternal Grandmother    Liver disease Maternal Grandmother    Heart disease Maternal Grandfather    Liver disease Maternal Grandfather     Schizophrenia Paternal Grandmother    Diabetes Paternal Grandmother    Mental illness Paternal Grandmother    Hypertension Father    Kidney disease Daughter        horse shoe kidneys recurrent UTI   Hypertension Mother    Multiple sclerosis Mother    Anesthesia problems Neg Hx     Social History Social History   Tobacco Use   Smoking status: Every Day    Types: E-cigarettes    Passive exposure: Never   Smokeless tobacco: Never  Vaping Use   Vaping Use: Some days  Substance Use Topics   Alcohol use: Yes    Alcohol/week: 5.0 standard drinks    Types: 5 Shots of liquor per week    Comment: last use 11/2020   Drug use: Not Currently    Types: Marijuana    Comment: last use 2017     Allergies   Patient has no known allergies.   Review of Systems Review of Systems Pertinent negatives listed in HPI  Physical Exam Triage Vital Signs ED Triage Vitals  Enc Vitals Group     BP 04/23/21 1842 (!) 157/74     Pulse Rate 04/23/21 1842 91     Resp 04/23/21 1842 16     Temp 04/23/21 1842 99 F (37.2 C)     Temp Source 04/23/21 1842 Oral     SpO2 04/23/21 1842 99 %     Weight --      Height --      Head Circumference --      Peak Flow --      Pain Score 04/23/21 1848 6     Pain Loc --      Pain Edu? --      Excl. in GC? --    No data found.  Updated Vital Signs BP (!) 157/74 (BP Location: Left Arm)    Pulse 91    Temp 99 F (37.2 C) (Oral)    Resp 16    SpO2 99%   Visual Acuity Right Eye Distance:   Left Eye Distance:   Bilateral Distance:    Right Eye Near:   Left Eye Near:    Bilateral Near:     Physical Exam Constitutional:      General: She is not in acute distress.    Appearance: She is well-developed. She is not ill-appearing or toxic-appearing.  HENT:     Head: Normocephalic and atraumatic.  Eyes:     Extraocular Movements: Extraocular movements intact.     Pupils: Pupils are equal, round, and reactive to light.  Cardiovascular:     Rate and  Rhythm: Normal rate and regular rhythm.  Pulmonary:  Effort: Pulmonary effort is normal.     Breath sounds: Normal breath sounds.  Abdominal:     Tenderness: There is abdominal tenderness in the left upper quadrant and left lower quadrant. There is no right CVA tenderness, left CVA tenderness, guarding or rebound.     Hernia: No hernia is present.  Neurological:     Mental Status: She is alert.  Psychiatric:        Attention and Perception: Attention normal.        Mood and Affect: Affect normal. Mood is anxious.        Speech: Speech normal.        Behavior: Behavior is cooperative.        Judgment: Judgment normal.     UC Treatments / Results  Labs (all labs ordered are listed, but only abnormal results are displayed) Labs Reviewed  POCT URINALYSIS DIP (MANUAL ENTRY) - Abnormal; Notable for the following components:      Result Value   Spec Grav, UA >=1.030 (*)    Blood, UA trace-intact (*)    Leukocytes, UA Small (1+) (*)    All other components within normal limits  URINE CULTURE    EKG   Radiology No results found.  Procedures Procedures (including critical care time)  Medications Ordered in UC Medications - No data to display  Initial Impression / Assessment and Plan / UC Course  I have reviewed the triage vital signs and the nursing notes.  Pertinent labs & imaging results that were available during my care of the patient were reviewed by me and considered in my medical decision making (see chart for details).    Left flank pain with acute LUQ Pain Examination of abdomen grossly unremarkable. Patient indicated isolated areas on left abdomen of discomfort, however exam yielded no rebound tenderness or guarding. Very low suspicion for acute abdomen. UA showed small leukocytes , urine culture pending.  Patient appears anxious cannot rule out anxiety as the source of abdominal pain. Strict ER precautions given if any RED Flag symptoms develop. Follow-up  with PCP as needed. Final Clinical Impressions(s) / UC Diagnoses   Final diagnoses:  Left flank pain  Acute LUQ pain     Discharge Instructions      Recommend trying Naproxen (Aleve) and warm compresses to alleviate pain. Drink plenty of fluids as your urine is concentrated. Try Naproxen two doses, if any of your symptoms worsen, do immediately to the emergency department.     ED Prescriptions   None    PDMP not reviewed this encounter.   Scot Jun, FNP 04/25/21 2119

## 2021-04-23 NOTE — Discharge Instructions (Addendum)
Recommend trying Naproxen (Aleve) and warm compresses to alleviate pain. Drink plenty of fluids as your urine is concentrated. Try Naproxen two doses, if any of your symptoms worsen, do immediately to the emergency department.

## 2021-04-23 NOTE — ED Triage Notes (Signed)
Pt here with lower left rib pain/LUQ pain since this morning. Pt is under the care of a cardiologist for elevated HR recently.

## 2021-04-25 LAB — URINE CULTURE
Culture: NO GROWTH
Special Requests: NORMAL

## 2021-04-27 ENCOUNTER — Emergency Department (HOSPITAL_COMMUNITY): Payer: Medicaid Other

## 2021-04-27 ENCOUNTER — Encounter (HOSPITAL_COMMUNITY): Payer: Self-pay | Admitting: Emergency Medicine

## 2021-04-27 ENCOUNTER — Emergency Department (HOSPITAL_COMMUNITY)
Admission: EM | Admit: 2021-04-27 | Discharge: 2021-04-27 | Disposition: A | Discharge status: Home / Self Care | Payer: Medicaid Other | Attending: Emergency Medicine | Admitting: Emergency Medicine

## 2021-04-27 DIAGNOSIS — R109 Unspecified abdominal pain: Secondary | ICD-10-CM | POA: Insufficient documentation

## 2021-04-27 DIAGNOSIS — M549 Dorsalgia, unspecified: Secondary | ICD-10-CM | POA: Insufficient documentation

## 2021-04-27 DIAGNOSIS — R091 Pleurisy: Secondary | ICD-10-CM | POA: Diagnosis not present

## 2021-04-27 DIAGNOSIS — R0781 Pleurodynia: Secondary | ICD-10-CM | POA: Diagnosis present

## 2021-04-27 DIAGNOSIS — N9489 Other specified conditions associated with female genital organs and menstrual cycle: Secondary | ICD-10-CM | POA: Insufficient documentation

## 2021-04-27 LAB — CBC WITH DIFFERENTIAL/PLATELET
Abs Immature Granulocytes: 0.01 10*3/uL (ref 0.00–0.07)
Basophils Absolute: 0 10*3/uL (ref 0.0–0.1)
Basophils Relative: 0 %
Eosinophils Absolute: 0.1 10*3/uL (ref 0.0–0.5)
Eosinophils Relative: 2 %
HCT: 41.5 % (ref 36.0–46.0)
Hemoglobin: 13.6 g/dL (ref 12.0–15.0)
Immature Granulocytes: 0 %
Lymphocytes Relative: 39 %
Lymphs Abs: 2 10*3/uL (ref 0.7–4.0)
MCH: 30.5 pg (ref 26.0–34.0)
MCHC: 32.8 g/dL (ref 30.0–36.0)
MCV: 93 fL (ref 80.0–100.0)
Monocytes Absolute: 0.4 10*3/uL (ref 0.1–1.0)
Monocytes Relative: 8 %
Neutro Abs: 2.7 10*3/uL (ref 1.7–7.7)
Neutrophils Relative %: 51 %
Platelets: 282 10*3/uL (ref 150–400)
RBC: 4.46 MIL/uL (ref 3.87–5.11)
RDW: 11.5 % (ref 11.5–15.5)
WBC: 5.2 10*3/uL (ref 4.0–10.5)
nRBC: 0 % (ref 0.0–0.2)

## 2021-04-27 LAB — COMPREHENSIVE METABOLIC PANEL
ALT: 10 U/L (ref 0–44)
AST: 14 U/L — ABNORMAL LOW (ref 15–41)
Albumin: 4.4 g/dL (ref 3.5–5.0)
Alkaline Phosphatase: 64 U/L (ref 38–126)
Anion gap: 5 (ref 5–15)
BUN: 9 mg/dL (ref 6–20)
CO2: 29 mmol/L (ref 22–32)
Calcium: 9.7 mg/dL (ref 8.9–10.3)
Chloride: 103 mmol/L (ref 98–111)
Creatinine, Ser: 0.65 mg/dL (ref 0.44–1.00)
GFR, Estimated: >60 mL/min (ref >60)
Glucose, Bld: 93 mg/dL (ref 70–99)
Potassium: 3.8 mmol/L (ref 3.5–5.1)
Sodium: 137 mmol/L (ref 135–145)
Total Bilirubin: 0.7 mg/dL (ref 0.3–1.2)
Total Protein: 7.5 g/dL (ref 6.5–8.1)

## 2021-04-27 LAB — I-STAT BETA HCG BLOOD, ED (MC, WL, AP ONLY): I-stat hCG, quantitative: <5 m[IU]/mL (ref <5)

## 2021-04-27 LAB — D-DIMER, QUANTITATIVE: D-Dimer, Quant: <0.27 ug/mL-FEU (ref 0.00–0.50)

## 2021-04-27 LAB — TROPONIN I (HIGH SENSITIVITY): Troponin I (High Sensitivity): <2 ng/L (ref <18)

## 2021-04-27 LAB — LIPASE, BLOOD: Lipase: 27 U/L (ref 11–51)

## 2021-04-27 MED ORDER — OMEPRAZOLE 20 MG PO CPDR: 20.0000 mg | DELAYED_RELEASE_CAPSULE | Freq: Two times a day (BID) | ORAL | 0 refills | Status: DC

## 2021-04-27 MED ORDER — KETOROLAC TROMETHAMINE 30 MG/ML IJ SOLN
15.0000 mg | Freq: Once | INTRAMUSCULAR | Status: AC
  Administered 2021-04-27: 09:00:00 15 mg via INTRAVENOUS
  Filled 2021-04-27: qty 1

## 2021-04-27 MED ORDER — SUCRALFATE 1 G PO TABS: 1.0000 g | ORAL_TABLET | Freq: Three times a day (TID) | ORAL | 0 refills | Status: DC

## 2021-04-27 MED ORDER — IBUPROFEN 600 MG PO TABS: 600.0000 mg | ORAL_TABLET | Freq: Three times a day (TID) | ORAL | 0 refills | Status: DC | PRN

## 2021-04-27 NOTE — Discharge Instructions (Signed)
You are seen in the ER for chest pain radiating to the back, that is worse with breathing and also left-sided flank pain.  Cardiac enzymes were normal.  Screening test for pulmonary embolism is also negative. We suspect that your pain could be pleurisy or chest wall pain.  Other possibility includes esophageal spasms, gastritis.  We recommend that you follow-up with your primary care doctor for further evaluation, as the ER work-up is limited to emergent etiologies only.  Additionally, we had also ordered an ultrasound of your pelvis given your history of large ovarian cyst.  Fortunately, ultrasound is negative for any cyst at all.  Furthermore there is no evidence of kidney stones or enlarged spleen.  Take ibuprofen 400 to 600 mg every 6-8 hours for pain control Start taking the antacids.  Return to the ER if your symptoms get worse.

## 2021-04-27 NOTE — ED Notes (Signed)
Pt states understanding of dc instructions, importance of follow up, and prescription. Pt denies questions or concerns upon dc. Pt declined wheelchair assistance upon dc. Pt ambulated out of ed w/ steady gait. No belongings left in room upon dc.  

## 2021-04-27 NOTE — ED Triage Notes (Signed)
Per pt, states left rib cage pain for 1 week-pain with inhalation started this morning-pain radiated to back

## 2021-04-27 NOTE — ED Provider Notes (Signed)
[facility] DEPT Provider Note   CSN: [id number] Arrival date & time: 04/27/21  0815     History Chief Complaint  Patient presents with   rib cage pain    Penny Becker is a 30 y.o. female.  HPI     30 year old female comes in with chief complaint of rib cage pain. She has no significant medical history.  Patient reports that over the last few weeks she has had some left-sided abdominal pain.  More recently she is also having generalized midsternal chest pain that is radiating to her back.  This pain is worse with deep inspiration.  She saw her PCP for the left flank pain and had x-rays done which were reassuring.  Past history is positive for ovarian cyst.  Our record review indicates that she had a 5+ centimeter ovarian cyst on the left side.  There is no history of kidney stones and patient denies any UTI-like symptoms.   Pt has no hx of PE, DVT and denies long distance travels or surgery in the past 6 weeks, active cancer, recent immobilization.  Patient vapes but denies any substance use.  She does have birth control implanted.  Past Medical History:  Diagnosis Date   Abnormal Pap smear    Anemia    1st pregnancy, tx'd with iron   Chlamydia    Chronic hypertension during pregnancy, antepartum 02/16/2017   In review of patient's chart with Dr. Nehemiah Settle, patient has GHTN and will be induced at 37 weeks on 02/22/17.   Patient had elevated BP in MAU on 02-16-2017 and in clinic on 02-07-2017.   [x]  Aspirin 81 mg daily after 12 weeks; discontinue after 36 weeks Current antihypertensives:  None   Baseline and surveillance labs (pulled in from [facility], refresh links as needed)  Lab Results Component Value Date    GERD (gastroesophageal reflux disease)    during pregnancy   Gestational hypertension w/o significant proteinuria in 3rd trimester 03/21/2011   12/24 24hr urine protein 64mg     History of recurrent UTIs last one 2 months ago   Hx of  preeclampsia, prior pregnancy, currently pregnant 08/19/2016   PEC in 2009 IOL at 37 weeks   Prior pregnancy complicated by [facility], antepartum    during 1st pregnancy   Subchorionic hemorrhage 10/22/10    Patient Active Problem List   Diagnosis Date Noted   Abnormal Pap smear of cervix 03/15/21 03/23/2021   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   Complex cyst of left ovary 03/16/2020    Past Surgical History:  Procedure Laterality Date   DILATION AND CURETTAGE OF UTERUS     INDUCED ABORTION     2010, 2011   LAPAROSCOPIC OVARIAN CYSTECTOMY Bilateral 03/28/2020   Procedure: LAPAROSCOPIC BILATERAL OVARIAN CYSTECTOMIES;  Surgeon: Aletha Halim, MD;  Location: [place];  Service: Gynecology;  Laterality: Bilateral;     OB History     Gravida  8   Para  3   Term  3   Preterm  0   AB  4   Living  3      SAB  2   IAB  2   Ectopic  0   Multiple  0   Live Births  3           Family History  Problem Relation Age of Onset   Heart disease Maternal Grandmother    Liver disease Maternal Grandmother  Heart disease Maternal Grandfather    Liver disease Maternal Grandfather    Schizophrenia Paternal Grandmother    Diabetes Paternal Grandmother    Mental illness Paternal Grandmother    Hypertension Father    Kidney disease Daughter        horse shoe kidneys recurrent UTI   Hypertension Mother    Multiple sclerosis Mother    Anesthesia problems Neg Hx     Social History   Tobacco Use   Smoking status: Every Day    Types: E-cigarettes    Passive exposure: Never   Smokeless tobacco: Never  Vaping Use   Vaping Use: Some days  Substance Use Topics   Alcohol use: Yes    Alcohol/week: 5.0 standard drinks    Types: 5 Shots of liquor per week    Comment: last use 11/2020   Drug use: Not Currently    Types: Marijuana    Comment: last use 2017    Home Medications Prior to Admission medications    Medication Sig Start Date End Date Taking? Authorizing Provider  ibuprofen (ADVIL) 600 MG tablet Take 1 tablet (600 mg total) by mouth every 8 (eight) hours as needed. 04/27/21  Yes Varney Biles, MD  omeprazole (PRILOSEC) 20 MG capsule Take 1 capsule (20 mg total) by mouth 2 (two) times daily before a meal. 04/27/21  Yes [place] Lipa, MD  sucralfate (CARAFATE) 1 g tablet Take 1 tablet (1 g total) by mouth 4 (four) times daily -  with meals and at bedtime. 04/27/21  Yes Varney Biles, MD  acyclovir (ZOVIRAX) 800 MG tablet Take 1 tablet (800 mg total) by mouth daily. Patient not taking: Reported on 03/27/2021 03/22/21   Junious Dresser, FNP  Etonogestrel [facility]) Inject into the skin.    [provider]  loratadine (CLARITIN) 10 MG tablet Take 10 mg by mouth daily as needed for allergies or itching (rash).    [provider]  oxyCODONE-acetaminophen (PERCOCET/ROXICET) 5-325 MG tablet Take 1 tablet by mouth every 6 (six) hours as needed. Patient not taking: Reported on 04/18/2020 03/28/20   Aletha Halim, MD  promethazine (PHENERGAN) 25 MG tablet Take 1 tablet (25 mg total) by mouth every 6 (six) hours as needed for nausea or vomiting. Patient not taking: Reported on 03/15/2021 03/16/20   Aletha Halim, MD    Allergies    Patient has no known allergies.  Review of Systems   Review of Systems  Constitutional:  Positive for activity change.  Respiratory:  Negative for cough and shortness of breath.   Cardiovascular:  Positive for chest pain.  Gastrointestinal:  Negative for nausea and vomiting.  Genitourinary:  Positive for flank pain. Negative for vaginal bleeding.  Neurological:  Negative for dizziness.  All other systems reviewed and are negative.  Physical Exam Updated Vital Signs BP 116/66    Pulse (!) 49    Temp 97.6 F (36.4 C) (Oral)    Resp 14    Ht 5' 7.5" (1.715 m)    Wt 78.5 kg    LMP 04/20/2021    SpO2 99%    BMI 26.70 kg/m   Physical  Exam Vitals and nursing note reviewed.  Constitutional:      Appearance: She is well-developed.  HENT:     Head: Atraumatic.  Cardiovascular:     Rate and Rhythm: Normal rate and regular rhythm.     Pulses: Normal pulses.     Heart sounds: No murmur heard. Pulmonary:     Effort:  Pulmonary effort is normal.     Breath sounds: Normal breath sounds.  Musculoskeletal:     Cervical back: Normal range of motion and neck supple.     Right lower leg: No edema.     Left lower leg: No edema.  Skin:    General: Skin is warm and dry.  Neurological:     Mental Status: She is alert and oriented to person, place, and time.    ED Results / Procedures / Treatments   Labs (all labs ordered are listed, but only abnormal results are displayed) Labs Reviewed  COMPREHENSIVE METABOLIC PANEL - Abnormal; Notable for the following components:      Result Value   AST 14 (*)    All other components within normal limits  D-DIMER, QUANTITATIVE  CBC WITH DIFFERENTIAL/PLATELET  LIPASE, BLOOD  I-STAT BETA HCG BLOOD, ED ([facility], [facility], [facility] ONLY)  TROPONIN I (HIGH SENSITIVITY)    EKG None  Radiology US Abdomen Complete  Result Date: 04/27/2021 CLINICAL DATA:  Pelvic pain, hematuria.  Evaluate for kidney stone. EXAM: ABDOMEN ULTRASOUND COMPLETE COMPARISON:  08/24/2007 FINDINGS: Gallbladder: No gallstones or wall thickening visualized. No sonographic Murphy sign noted by sonographer. Common bile duct: Diameter: Normal caliber, 5 mm Liver: No focal lesion identified. Within normal limits in parenchymal echogenicity. Portal vein is patent on color Doppler imaging with normal direction of blood flow towards the liver. IVC: No abnormality visualized. Pancreas: Visualized portion unremarkable. Spleen: Size and appearance within normal limits. Right Kidney: Length: 10.9 cm. Echogenicity within normal limits. No mass or hydronephrosis visualized. No visible stones. Left Kidney: Length: 10.1 cm. Echogenicity within normal  limits. No mass or hydronephrosis visualized. No visible stones. Abdominal aorta: No aneurysm visualized. Other findings: None. IMPRESSION: Negative. Electronically Signed   By: Rolm Baptise M.D.   On: 04/27/2021 12:08   US Transvaginal Non-OB  Result Date: 04/27/2021 CLINICAL DATA:  Pelvic pain.  History of left ovarian cyst EXAM: TRANSABDOMINAL AND TRANSVAGINAL ULTRASOUND OF PELVIS DOPPLER ULTRASOUND OF OVARIES TECHNIQUE: Both transabdominal and transvaginal ultrasound examinations of the pelvis were performed. Transabdominal technique was performed for global imaging of the pelvis including uterus, ovaries, adnexal regions, and pelvic cul-de-sac. It was necessary to proceed with endovaginal exam following the transabdominal exam to visualize the ovaries. Color and duplex Doppler ultrasound was utilized to evaluate blood flow to the ovaries. COMPARISON:  None. FINDINGS: Uterus Measurements: 9.3 x 4.5 x 5.6 cm = volume: 122 mL. No fibroids or other mass visualized. Endometrium Thickness: 5 mm in thickness.  No focal abnormality visualized. Right ovary Measurements: 3.4 x 2.0 x 1.8 cm = volume: 6.3 mL. Normal appearance/no adnexal mass. Left ovary Measurements: 2.6 x 2.1 x 1.8 cm = volume: 5.4 mL. Normal appearance/no adnexal mass. Pulsed Doppler evaluation of both ovaries demonstrates normal low-resistance arterial and venous waveforms. Other findings No abnormal free fluid. IMPRESSION: Normal study.  No ovarian/adnexal mass or evidence of torsion. Electronically Signed   By: Rolm Baptise M.D.   On: 04/27/2021 12:09   US Pelvis Complete  Result Date: 04/27/2021 CLINICAL DATA:  Pelvic pain.  History of left ovarian cyst EXAM: TRANSABDOMINAL AND TRANSVAGINAL ULTRASOUND OF PELVIS DOPPLER ULTRASOUND OF OVARIES TECHNIQUE: Both transabdominal and transvaginal ultrasound examinations of the pelvis were performed. Transabdominal technique was performed for global imaging of the pelvis including uterus, ovaries,  adnexal regions, and pelvic cul-de-sac. It was necessary to proceed with endovaginal exam following the transabdominal exam to visualize the ovaries. Color and duplex Doppler ultrasound was  utilized to evaluate blood flow to the ovaries. COMPARISON:  None. FINDINGS: Uterus Measurements: 9.3 x 4.5 x 5.6 cm = volume: 122 mL. No fibroids or other mass visualized. Endometrium Thickness: 5 mm in thickness.  No focal abnormality visualized. Right ovary Measurements: 3.4 x 2.0 x 1.8 cm = volume: 6.3 mL. Normal appearance/no adnexal mass. Left ovary Measurements: 2.6 x 2.1 x 1.8 cm = volume: 5.4 mL. Normal appearance/no adnexal mass. Pulsed Doppler evaluation of both ovaries demonstrates normal low-resistance arterial and venous waveforms. Other findings No abnormal free fluid. IMPRESSION: Normal study.  No ovarian/adnexal mass or evidence of torsion. Electronically Signed   By: Charlett Nose M.D.   On: 04/27/2021 12:09   [place] Art/Ven Flow Abd Pelv Doppler  Result Date: 04/27/2021 CLINICAL DATA:  Pelvic pain.  History of left ovarian cyst EXAM: TRANSABDOMINAL AND TRANSVAGINAL ULTRASOUND OF PELVIS DOPPLER ULTRASOUND OF OVARIES TECHNIQUE: Both transabdominal and transvaginal ultrasound examinations of the pelvis were performed. Transabdominal technique was performed for global imaging of the pelvis including uterus, ovaries, adnexal regions, and pelvic cul-de-sac. It was necessary to proceed with endovaginal exam following the transabdominal exam to visualize the ovaries. Color and duplex Doppler ultrasound was utilized to evaluate blood flow to the ovaries. COMPARISON:  None. FINDINGS: Uterus Measurements: 9.3 x 4.5 x 5.6 cm = volume: 122 mL. No fibroids or other mass visualized. Endometrium Thickness: 5 mm in thickness.  No focal abnormality visualized. Right ovary Measurements: 3.4 x 2.0 x 1.8 cm = volume: 6.3 mL. Normal appearance/no adnexal mass. Left ovary Measurements: 2.6 x 2.1 x 1.8 cm = volume: 5.4 mL. Normal  appearance/no adnexal mass. Pulsed Doppler evaluation of both ovaries demonstrates normal low-resistance arterial and venous waveforms. Other findings No abnormal free fluid. IMPRESSION: Normal study.  No ovarian/adnexal mass or evidence of torsion. Electronically Signed   By: Charlett Nose M.D.   On: 04/27/2021 12:09    Procedures Procedures   Medications Ordered in ED Medications  ketorolac (TORADOL) 30 MG/ML injection 15 mg (15 mg Intravenous Given 04/27/21 0919)    ED Course  I have reviewed the triage vital signs and the nursing notes.  Pertinent labs & imaging results that were available during my care of the patient were reviewed by me and considered in my medical decision making (see chart for details).  Clinical Course as of 04/27/21 1257  Fri Apr 27, 2021  1256 Extensive work-up is reassuring in the ER.  Advised PCP follow-up.  Results discussed with the patient [AN]    Clinical Course User Index [AN] Derwood Kaplan, MD   MDM Rules/Calculators/A&P                         30 year old female comes in with chief complaint of chest pain radiating to her back.  Pain is pleuritic in nature as well.  Additionally, patient is having left flank pain that has been present for longer duration.  Differential diagnosis includes kidney stone, pleural effusion, pneumonia, PE, worsening ovarian cyst, torsion.  No risk factors for thrombosis besides the birth control use.  Basic labs ordered including D-dimer and troponin.  12:57 PM Patient's initial work-up is completely reassuring.  Toradol took the edge off the pain.  I reviewed patient's ultrasound results from previous visit.  She had a 5+ centimeter ovarian cyst, therefore we will proceed with torsion study and also ultrasound kidney to ensure there is no hydronephrosis, splenomegaly.     Final Clinical Impression(s) / ED  Diagnoses Final diagnoses:  Left flank pain  Pleurisy    Rx / DC Orders ED Discharge Orders           Ordered    ibuprofen (ADVIL) 600 MG tablet  Every 8 hours PRN        04/27/21 1256    omeprazole (PRILOSEC) 20 MG capsule  2 times daily before meals        04/27/21 1256    sucralfate (CARAFATE) 1 g tablet  3 times daily with meals & bedtime        04/27/21 [place], Jorryn Casagrande, MD 04/27/21 1257

## 2021-06-07 ENCOUNTER — Ambulatory Visit
Admission: RE | Admit: 2021-06-07 | Discharge: 2021-06-07 | Disposition: A | Discharge status: Home / Self Care | Payer: Medicaid Other | Source: Ambulatory Visit | Attending: Family Medicine | Admitting: Family Medicine

## 2021-06-07 ENCOUNTER — Other Ambulatory Visit: Payer: Self-pay | Admitting: Family Medicine

## 2021-06-07 ENCOUNTER — Other Ambulatory Visit: Payer: Self-pay

## 2021-06-07 DIAGNOSIS — R232 Flushing: Secondary | ICD-10-CM | POA: Diagnosis present

## 2021-06-07 DIAGNOSIS — Z8742 Personal history of other diseases of the female genital tract: Secondary | ICD-10-CM | POA: Diagnosis present

## 2021-06-07 DIAGNOSIS — R102 Pelvic and perineal pain: Secondary | ICD-10-CM | POA: Diagnosis present

## 2021-06-07 DIAGNOSIS — R110 Nausea: Secondary | ICD-10-CM | POA: Diagnosis present

## 2021-06-07 DIAGNOSIS — R112 Nausea with vomiting, unspecified: Secondary | ICD-10-CM | POA: Diagnosis present

## 2021-06-07 DIAGNOSIS — R109 Unspecified abdominal pain: Secondary | ICD-10-CM

## 2021-06-07 DIAGNOSIS — R1030 Lower abdominal pain, unspecified: Secondary | ICD-10-CM | POA: Diagnosis present

## 2021-06-07 MED ORDER — IOHEXOL 300 MG/ML  SOLN
100.0000 mL | Freq: Once | INTRAMUSCULAR | Status: AC | PRN
  Administered 2021-06-07: 100 mL via INTRAVENOUS

## 2021-06-19 ENCOUNTER — Telehealth: Payer: Self-pay

## 2021-06-19 NOTE — Telephone Encounter (Signed)
Unable to reach pt she called to schedule an appt I called back to get her scheduled and was unable to leave a message

## 2021-06-20 ENCOUNTER — Other Ambulatory Visit (HOSPITAL_COMMUNITY): Payer: Self-pay | Admitting: Family Medicine

## 2021-06-20 ENCOUNTER — Other Ambulatory Visit: Payer: Self-pay | Admitting: Family Medicine

## 2021-06-20 DIAGNOSIS — R102 Pelvic and perineal pain: Secondary | ICD-10-CM

## 2021-06-20 DIAGNOSIS — N8003 Adenomyosis of the uterus: Secondary | ICD-10-CM

## 2021-06-20 DIAGNOSIS — Z8742 Personal history of other diseases of the female genital tract: Secondary | ICD-10-CM

## 2021-06-24 ENCOUNTER — Ambulatory Visit: Payer: Medicaid Other

## 2021-06-24 ENCOUNTER — Other Ambulatory Visit: Payer: Self-pay

## 2021-06-24 ENCOUNTER — Ambulatory Visit
Admission: RE | Admit: 2021-06-24 | Discharge: 2021-06-24 | Disposition: A | Discharge status: Home / Self Care | Payer: Medicaid Other | Source: Ambulatory Visit | Attending: Family Medicine | Admitting: Family Medicine

## 2021-06-24 DIAGNOSIS — R102 Pelvic and perineal pain: Secondary | ICD-10-CM

## 2021-06-24 DIAGNOSIS — N8003 Adenomyosis of the uterus: Secondary | ICD-10-CM | POA: Insufficient documentation

## 2021-06-24 DIAGNOSIS — Z8742 Personal history of other diseases of the female genital tract: Secondary | ICD-10-CM | POA: Diagnosis present

## 2021-06-24 MED ORDER — GADOBUTROL 1 MMOL/ML IV SOLN
7.5000 mL | Freq: Once | INTRAVENOUS | Status: AC | PRN
  Administered 2021-06-24: 7.5 mL via INTRAVENOUS

## 2021-09-04 ENCOUNTER — Encounter: Payer: Medicaid Other | Admitting: Obstetrics and Gynecology

## 2021-09-18 ENCOUNTER — Ambulatory Visit
Admission: EM | Admit: 2021-09-18 | Discharge: 2021-09-18 | Disposition: A | Discharge status: Home / Self Care | Payer: Medicaid Other | Attending: Emergency Medicine | Admitting: Emergency Medicine

## 2021-09-18 DIAGNOSIS — R197 Diarrhea, unspecified: Secondary | ICD-10-CM

## 2021-09-18 DIAGNOSIS — R1010 Upper abdominal pain, unspecified: Secondary | ICD-10-CM | POA: Diagnosis not present

## 2021-09-18 DIAGNOSIS — R102 Pelvic and perineal pain: Secondary | ICD-10-CM | POA: Insufficient documentation

## 2021-09-18 LAB — POCT URINE PREGNANCY: Preg Test, Ur: NEGATIVE

## 2021-09-18 LAB — POCT URINALYSIS DIP (MANUAL ENTRY)
Bilirubin, UA: NEGATIVE
Blood, UA: NEGATIVE
Glucose, UA: NEGATIVE mg/dL
Ketones, POC UA: NEGATIVE mg/dL
Nitrite, UA: NEGATIVE
Protein Ur, POC: NEGATIVE mg/dL
Spec Grav, UA: 1.015 (ref 1.010–1.025)
Urobilinogen, UA: 0.2 E.U./dL
pH, UA: 5 (ref 5.0–8.0)

## 2021-09-18 NOTE — ED Provider Notes (Signed)
?[facility] ? ? ? ?CSN: [id number] ?Arrival date & time: 09/18/21  1216 ? ? ?  ? ?History   ?Chief Complaint ?Chief Complaint  ?Patient presents with  ? Abdominal Pain  ? Back Pain  ? Diarrhea  ? ? ?HPI ?Penny Becker is a 31 y.o. female.  Patient presents with epigastric abdominal pain radiating to the center of her back x2 days.  She also reports diarrhea x4 days.  No fever, vomiting, dysuria, hematuria, vaginal discharge, pelvic pain, or other symptoms.  She took her mother's ulcer medication; she does not know the name of the medication.  Her medical history includes GERD.   ? ?The history is provided by the patient and medical records.  ? ?Past Medical History:  ?Diagnosis Date  ? Abnormal Pap smear   ? Anemia   ? 1st pregnancy, tx'd with iron  ? Chlamydia   ? Chronic hypertension during pregnancy, antepartum 02/16/2017  ? In review of patient's chart with Dr. Adrian Blackwater, patient has [facility] and will be induced at 37 weeks on 02/22/17.   Patient had elevated BP in MAU on 02-16-2017 and in clinic on 02-07-2017.   [x]  Aspirin 81 mg daily after 12 weeks; discontinue after 36 weeks Current antihypertensives:  None   Baseline and surveillance labs (pulled in from [facility], refresh links as needed)  Lab Results Component Value Date   ? GERD (gastroesophageal reflux disease)   ? during pregnancy  ? Gestational hypertension w/o significant proteinuria in 3rd trimester 03/21/2011  ? 12/24 24hr urine protein 64mg    ? History of recurrent UTIs last one 2 months ago  ? Hx of preeclampsia, prior pregnancy, currently pregnant 08/19/2016  ? PEC in 2009 IOL at 37 weeks  ? Prior pregnancy complicated by PIH, antepartum   ? during 1st pregnancy  ? Subchorionic hemorrhage 10/22/10  ? ? ?Patient Active Problem List  ? Diagnosis Date Noted  ? Pelvic pain in female 09/18/2021  ? Abnormal Pap smear of cervix 03/15/21 03/23/2021  ? 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 ? ?Past Surgical History:  ?Procedure Laterality Date  ? DILATION AND CURETTAGE OF UTERUS    ? INDUCED ABORTION    ? 2010, 2011  ? LAPAROSCOPIC OVARIAN CYSTECTOMY Bilateral 03/28/2020  ? Procedure: LAPAROSCOPIC BILATERAL OVARIAN CYSTECTOMIES;  Surgeon: 03-02-2003, MD;  Location: [facility] OR;  Service: Gynecology;  Laterality: Bilateral;  ? ? ?OB History   ? ? Gravida  ?8  ? Para  ?3  ? Term  ?3  ? Preterm  ?0  ? AB  ?4  ? Living  ?3  ?  ? ? SAB  ?2  ? IAB  ?2  ? Ectopic  ?0  ? Multiple  ?0  ? Live Births  ?3  ?   ?  ?  ? ? ? ?Home Medications   ? ?Prior to Admission medications   ?Medication Sig Start Date End Date Taking? Authorizing Provider  ?acyclovir (ZOVIRAX) 800 MG tablet Take 1 tablet (800 mg total) by mouth daily. ?Patient not taking: Reported on 03/27/2021 03/22/21   03/29/2021, FNP  ?Etonogestrel (IMPLANON [place]) Inject into the skin.    [provider]  ?ibuprofen (ADVIL) 600 MG tablet Take 1 tablet (600 mg total) by mouth every 8 (eight) hours as needed. 04/27/21   Wendi Snipes, MD  ?loratadine (CLARITIN) 10 MG tablet Take 10 mg by mouth daily as needed for allergies or  itching (rash).    [provider]  ?omeprazole (PRILOSEC) 20 MG capsule Take 1 capsule (20 mg total) by mouth 2 (two) times daily before a meal. 04/27/21   Derwood KaplanNanavati, Ankit, MD  ?oxyCODONE-acetaminophen (PERCOCET/ROXICET) 5-325 MG tablet Take 1 tablet by mouth every 6 (six) hours as needed. ?Patient not taking: Reported on 04/18/2020 03/28/20   [place] BingPickens, Charlie, MD  ?promethazine (PHENERGAN) 25 MG tablet Take 1 tablet (25 mg total) by mouth every 6 (six) hours as needed for nausea or vomiting. ?Patient not taking: Reported on 03/15/2021 03/16/20   [place] BingPickens, Charlie, MD  ?sucralfate (CARAFATE) 1 g tablet Take 1 tablet (1 g total) by mouth 4 (four) times daily -  with meals and at bedtime. 04/27/21   Derwood KaplanNanavati, Ankit, MD  ? ? ?Family History ?Family History  ?Problem Relation Age of Onset  ? Heart disease Maternal  Grandmother   ? Liver disease Maternal Grandmother   ? Heart disease Maternal Grandfather   ? Liver disease Maternal Grandfather   ? Schizophrenia Paternal Grandmother   ? Diabetes Paternal Grandmother   ? Mental illness Paternal Grandmother   ? Hypertension Father   ? Kidney disease Daughter   ?     horse shoe kidneys recurrent UTI  ? Hypertension Mother   ? Multiple sclerosis Mother   ? Anesthesia problems Neg Hx   ? ? ?Social History ?Social History  ? ?Tobacco Use  ? Smoking status: Every Day  ?  Types: E-cigarettes  ?  Passive exposure: Never  ? Smokeless tobacco: Never  ?Vaping Use  ? Vaping Use: Some days  ?Substance Use Topics  ? Alcohol use: Yes  ?  Alcohol/week: 5.0 standard drinks  ?  Types: 5 Shots of liquor per week  ?  Comment: last use 11/2020  ? Drug use: Not Currently  ?  Types: Marijuana  ?  Comment: last use 2017  ? ? ? ?Allergies   ?Patient has no known allergies. ? ? ?Review of Systems ?Review of Systems  ?Constitutional:  Negative for chills and fever.  ?Respiratory:  Negative for cough and shortness of breath.   ?Cardiovascular:  Negative for chest pain and palpitations.  ?Gastrointestinal:  Positive for abdominal pain and diarrhea. Negative for vomiting.  ?Genitourinary:  Negative for dysuria, hematuria, pelvic pain and vaginal discharge.  ?Musculoskeletal:  Positive for back pain. Negative for arthralgias.  ?Skin:  Negative for color change and rash.  ?All other systems reviewed and are negative. ? ? ?Physical Exam ?Triage Vital Signs ?ED Triage Vitals  ?Enc Vitals Group  ?   BP 09/18/21 1232 113/79  ?   Pulse Rate 09/18/21 1232 68  ?   Resp 09/18/21 1232 16  ?   Temp 09/18/21 1232 98.2 ?F (36.8 ?C)  ?   Temp Source 09/18/21 1232 Temporal  ?   SpO2 09/18/21 1232 100 %  ?   Weight --   ?   Height --   ?   Head Circumference --   ?   Peak Flow --   ?   Pain Score 09/18/21 1233 7  ?   Pain Loc --   ?   Pain Edu? --   ?   Excl. in GC? --   ? ?No data found. ? ?Updated Vital Signs ?BP 113/79 (BP  Location: Left Arm)   Pulse 68   Temp 98.2 ?F (36.8 ?C) (Temporal)   Resp 16   SpO2 100%  ? ?Visual Acuity ?Right Eye Distance:   ?  Left Eye Distance:   ?Bilateral Distance:   ? ?Right Eye Near:   ?Left Eye Near:    ?Bilateral Near:    ? ?Physical Exam ?Vitals and nursing note reviewed.  ?Constitutional:   ?   General: She is not in acute distress. ?   Appearance: She is well-developed. She is not ill-appearing.  ?HENT:  ?   Head: Normocephalic and atraumatic.  ?   Mouth/Throat:  ?   Mouth: Mucous membranes are moist.  ?Cardiovascular:  ?   Rate and Rhythm: Normal rate and regular rhythm.  ?   Heart sounds: Normal heart sounds.  ?Pulmonary:  ?   Effort: Pulmonary effort is normal. No respiratory distress.  ?   Breath sounds: Normal breath sounds.  ?Abdominal:  ?   General: Bowel sounds are normal.  ?   Palpations: Abdomen is soft.  ?   Tenderness: There is abdominal tenderness in the right upper quadrant, epigastric area and left upper quadrant. There is no right CVA tenderness, left CVA tenderness, guarding or rebound.  ?   Comments: Tender to palpation of upper abdomen.  No guarding or rebound.  No CVAT.   ?Musculoskeletal:  ?   Cervical back: Neck supple.  ?Skin: ?   General: Skin is warm and dry.  ?   Capillary Refill: Capillary refill takes less than 2 seconds.  ?Neurological:  ?   General: No focal deficit present.  ?   Mental Status: She is alert and oriented to person, place, and time.  ?   Gait: Gait normal.  ?Psychiatric:     ?   Mood and Affect: Mood normal.     ?   Behavior: Behavior normal.  ? ? ? ?UC Treatments / Results  ?Labs ?(all labs ordered are listed, but only abnormal results are displayed) ?Labs Reviewed  ?POCT URINALYSIS DIP (MANUAL ENTRY) - Abnormal; Notable for the following components:  ?    Result Value  ? Leukocytes, UA Trace (*)   ? All other components within normal limits  ?POCT URINE PREGNANCY  ? ? ?EKG ? ? ?Radiology ?No results found. ? ?Procedures ?Procedures (including  critical care time) ? ?Medications Ordered in UC ?Medications - No data to display ? ?Initial Impression / Assessment and Plan / UC Course  ?I have reviewed the triage vital signs and the nursing notes. ? ?[place]ertinen

## 2021-09-18 NOTE — ED Triage Notes (Signed)
Patient presents to Urgent Care with complaints of back pain, abdominal pain,and diarrhea x 4 days. Treating symptoms with moms ulcer med.  ? ?Denies fever.  ?

## 2021-09-18 NOTE — ED Notes (Signed)
Patient is being discharged from the Urgent Care and sent to the Emergency Department via personal vehicle . Per Arlana Pouch, NP, patient is in need of higher level of care due to abdominal pain. Patient is aware and verbalizes understanding of plan of care.  ?Vitals:  ? 09/18/21 1232  ?BP: 113/79  ?Pulse: 68  ?Resp: 16  ?Temp: 98.2 ?F (36.8 ?C)  ?SpO2: 100%  ?  ?

## 2021-09-18 NOTE — Discharge Instructions (Addendum)
Go to the emergency department for evaluation of your abdominal pain.  

## 2023-01-13 ENCOUNTER — Encounter: Payer: Self-pay | Admitting: Podiatry

## 2023-01-13 ENCOUNTER — Ambulatory Visit (INDEPENDENT_AMBULATORY_CARE_PROVIDER_SITE_OTHER): Payer: Medicaid Other

## 2023-01-13 ENCOUNTER — Ambulatory Visit: Payer: Medicaid Other | Admitting: Podiatry

## 2023-01-13 DIAGNOSIS — S90122A Contusion of left lesser toe(s) without damage to nail, initial encounter: Secondary | ICD-10-CM

## 2023-01-13 DIAGNOSIS — S92505A Nondisplaced unspecified fracture of left lesser toe(s), initial encounter for closed fracture: Secondary | ICD-10-CM

## 2023-01-13 NOTE — Progress Notes (Signed)
Subjective:  Patient ID: Penny Becker, female    DOB: 06-15-1990,  MRN: [id number] HPI Chief Complaint  Patient presents with   Toe Injury    5th toe left - jammed toe getting gas yesterday, swollen, tender and bruised   New Patient (Initial Visit)    32 y.o. female presents with the above complaint.   ROS: Denies fever chills nausea vomit muscle aches pains calf pain back pain chest pain shortness of breath.  Past Medical History:  Diagnosis Date   Abnormal Pap smear    Anemia    1st pregnancy, tx'd with iron   Chlamydia    Chronic hypertension during pregnancy, antepartum 02/16/2017   In review of patient's chart with Dr. Adrian Blackwater, patient has GHTN and will be induced at 37 weeks on 02/22/17.   Patient had elevated BP in MAU on 02-16-2017 and in clinic on 02-07-2017.   [x]  Aspirin 81 mg daily after 12 weeks; discontinue after 36 weeks Current antihypertensives:  None   Baseline and surveillance labs (pulled in from [facility], refresh links as needed)  Lab Results Component Value Date    GERD (gastroesophageal reflux disease)    during pregnancy   Gestational hypertension w/o significant proteinuria in 3rd trimester 03/21/2011   12/24 24hr urine protein 64mg     History of recurrent UTIs last one 2 months ago   Hx of preeclampsia, prior pregnancy, currently pregnant 08/19/2016   PEC in 2009 IOL at 37 weeks   Prior pregnancy complicated by [facility], antepartum    during 1st pregnancy   Subchorionic hemorrhage 10/22/10   Past Surgical History:  Procedure Laterality Date   DILATION AND CURETTAGE OF UTERUS     INDUCED ABORTION     2010, 2011   LAPAROSCOPIC OVARIAN CYSTECTOMY Bilateral 03/28/2020   Procedure: LAPAROSCOPIC BILATERAL OVARIAN CYSTECTOMIES;  Surgeon: [place] Bing, MD;  Location: [facility] OR;  Service: Gynecology;  Laterality: Bilateral;    Current Outpatient Medications:    Etonogestrel (IMPLANON [place]), Inject into the skin., Disp: , Rfl:    ibuprofen (ADVIL) 600 MG tablet,  Take 1 tablet (600 mg total) by mouth every 8 (eight) hours as needed., Disp: 15 tablet, Rfl: 0  No Known Allergies Review of Systems Objective:  There were no vitals filed for this visit.  General: Well developed, nourished, in no acute distress, alert and oriented x3   Dermatological: Skin is warm, dry and supple bilateral. Nails x 10 are well maintained; remaining integument appears unremarkable at this time. There are no open sores, no preulcerative lesions, no rash or signs of infection present.  Vascular: Dorsalis Pedis artery and Posterior Tibial artery pedal pulses are 2/4 bilateral with immedate capillary fill time. Pedal hair growth present. No varicosities and no lower extremity edema present bilateral.   Neruologic: Grossly intact via light touch bilateral. Vibratory intact via tuning fork bilateral. Protective threshold with Semmes Wienstein monofilament intact to all pedal sites bilateral. Patellar and Achilles deep tendon reflexes 2+ bilateral. No Babinski or clonus noted bilateral.   Musculoskeletal: No gross boney pedal deformities bilateral. No pain, crepitus, or limitation noted with foot and ankle range of motion bilateral. Muscular strength 5/5 in all groups tested bilateral.  She has some swelling and ecchymosis overlying the f fifth metatarsal phalangeal joint.  The toe was swollen to some degree as well.  Tender on palpation and range of motion.  No pain on palpation of the fifth metatarsal.  Gait: Unassisted, Nonantalgic.    Radiographs:  Radiographs taken today demonstrate  a spiral oblique fleck fracture of the proximal phalanx fifth digit left foot no other fractures identified this is not comminuted nondisplaced currently.  Assessment & Plan:   Assessment: Fractured fifth proximal phalanx left foot  Plan: Demonstrated to her how to tape the toe daily she will continue to do so for the next 6 to 8 weeks.  We did put her in a cam boot and I will follow-up with  her in a month or so if necessary     Chandlor Noecker T. [place], [place]

## 2023-01-20 ENCOUNTER — Telehealth: Payer: Self-pay | Admitting: Podiatry

## 2023-01-20 NOTE — Telephone Encounter (Signed)
Was seen last week and received a boot.  The boot makes her slip on the floor.  Can she try a shoe?  A shoe was mentioned at the visit.  Please call.

## 2023-03-19 ENCOUNTER — Telehealth: Payer: Self-pay | Admitting: Internal Medicine

## 2023-03-19 NOTE — Telephone Encounter (Signed)
Pt wants to know if Nexplanon is still good or needs to come in to change, not sure whats shes really askinig for but has questions about her nexplanon.

## 2023-04-08 ENCOUNTER — Ambulatory Visit: Payer: Medicaid Other | Admitting: Podiatry

## 2023-04-21 ENCOUNTER — Ambulatory Visit: Payer: Medicaid Other

## 2023-05-28 DIAGNOSIS — Z3202 Encounter for pregnancy test, result negative: Secondary | ICD-10-CM | POA: Diagnosis not present

## 2023-05-28 DIAGNOSIS — Z309 Encounter for contraceptive management, unspecified: Secondary | ICD-10-CM | POA: Diagnosis not present

## 2023-05-28 DIAGNOSIS — Z30013 Encounter for initial prescription of injectable contraceptive: Secondary | ICD-10-CM | POA: Diagnosis not present

## 2023-05-28 DIAGNOSIS — Z3049 Encounter for surveillance of other contraceptives: Secondary | ICD-10-CM | POA: Diagnosis not present

## 2023-11-10 ENCOUNTER — Telehealth: Payer: Self-pay | Admitting: Family Medicine

## 2023-11-10 NOTE — Telephone Encounter (Signed)
 Called and left a message for patient to call for possible earlier scheduling appt. On 07./08/25

## 2023-11-11 ENCOUNTER — Ambulatory Visit: Admitting: Family Medicine

## 2024-02-05 ENCOUNTER — Ambulatory Visit

## 2024-02-05 VITALS — BP 122/65 | HR 62 | Wt 162.8 lb

## 2024-02-05 DIAGNOSIS — Z124 Encounter for screening for malignant neoplasm of cervix: Secondary | ICD-10-CM

## 2024-02-05 DIAGNOSIS — Z3046 Encounter for surveillance of implantable subdermal contraceptive: Secondary | ICD-10-CM

## 2024-02-05 DIAGNOSIS — Z309 Encounter for contraceptive management, unspecified: Secondary | ICD-10-CM | POA: Diagnosis not present

## 2024-02-05 DIAGNOSIS — Z113 Encounter for screening for infections with a predominantly sexual mode of transmission: Secondary | ICD-10-CM

## 2024-02-05 DIAGNOSIS — Z3202 Encounter for pregnancy test, result negative: Secondary | ICD-10-CM | POA: Diagnosis not present

## 2024-02-05 LAB — HM HIV SCREENING LAB: HM HIV Screening: NEGATIVE

## 2024-02-05 LAB — PREGNANCY, URINE: Preg Test, Ur: NEGATIVE

## 2024-02-05 LAB — WET PREP FOR TRICH, YEAST, CLUE
Clue Cell Exam: POSITIVE — AB
Trichomonas Exam: NEGATIVE
Yeast Exam: NEGATIVE

## 2024-02-05 NOTE — Progress Notes (Signed)
 [employer] [facility] DEPART[facility] [address], [address] [place] [address] Main phone: [phone]  Family Planning Visit - Repeat Yearly Visit  Subjective:  Penny Becker is a 33 y.o. [id number]  being seen today for an annual wellness visit and to discuss contraception options. The patient is currently using hormonal implant for pregnancy prevention, however device was placed 6 years ago. Patient does not want a pregnancy in the next year.   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  Patient reports her period is late. LMP was 12/17/23 and was a lighter than normal period. Her period in July was normal. Has taken home pregnancy tests that were negative. However, reports a pregnancy in the past where it was not detected on the urine test but was on a blood test. Has had nausea, breast tenderness.  She has had stress lately related to finding out her boyfriend had other partners and they have split up. She is not currently sexually active.  Has history of chronic intermittent pelvic pain related to cysts on her ovaries per pt. Last imaging was in 2023 and showed: Right ovary: A small cystic lesion with mild wall thickening in crenulated appearance is seen measuring 2.8 x 2.5 cm, consistent with a regressing corpus luteum cyst. Left ovary: Appears normal. No ovarian or adnexal  masses identified.  Pap ASCUS in 2022 with negative HPV. Will repeat today. She desires STI testing.  Patient denies other symptoms or concerns.   Review of Systems  Gastrointestinal:  Positive for nausea.  All other systems reviewed and are negative.  See flowsheet for further details and programmatic requirements Hyperlink available at the top of the signed note in blue.  Flow sheet content below:  Pregnancy Intention Screening Does the patient want to become pregnant in the next year?: No Does the patient's partner want to become pregnant in the next year?: No Would the patient like to discuss contraceptive options today?: Yes Other:  Password: [id number] Sexual History What age did you start your period?: 10 How often do you have your period?: irregular Date of last sex?: 02/02/24 Has the patient had unprotected sex within the last 5 days?: Yes Do you have sex with men, women, both men and women?: Men only In the past 2 months how many partners have you had sex with?: 1 In the past 12 months, how many partners have you had sex with?: 1 Is it possible that any of your sex partners in the past 12 months had sex with someone else whild they were still in a sexual relationship with you?: Yes What ways do you have sex?: Vaginal, Oral Do you or your partner use condoms and/or dental dams every time you have vaginal, oral or anal sex?: Sometimes Do you douche?: No Have you ever had an STD?: Yes Have any of your partners had an STD?: Yes Partner Previous STD?: Chlamydia Date?:  (pt report  when she was 14 yrs) Have you or your partner ever shot up drugs?: No Have any of your partners used drugs in the past?: No Have you or your partners exchanged money or drugs for sex?: No  Diabetes screening This patient is 33 y.o. with a BMI of Body mass index is 25.12 kg/m.SABRA  Is patient eligible for diabetes screening (age >35 and BMI >25)?  no  Was Hgb A1c ordered? no  STI screening Patient  reports 1 of partners in last year.   Cervical Cancer Screening  Result Date Procedure Results Follow-ups  03/15/2021 IGP, Aptima HPV DIAGNOSIS:: Comment (A) Recommendation:: Comment (A) Specimen adequacy:: Comment Clinician Provided ICD10: Comment Performed by:: Comment Electronically signed by:: Comment PAP Smear Comment: . PATHOLOGIST PROVIDED ICD10:: Comment Note:: Comment Test Methodology: Comment HPV Aptima: Negative   08/19/2016 Cytology - PAP Adequacy: Satisfactory for evaluation  endocervical/transformation zone component PRESENT. Diagnosis: NEGATIVE FOR INTRAEPITHELIAL LESIONS OR MALIGNANCY. Chlamydia: Negative Neisseria Gonorrhea: Negative Material Submitted: CervicoVaginal Pap [ThinPrep Imaged] CYTOLOGY - PAP: PAP RESULT   07/01/2011 Cytology - PAP     Health Maintenance Due  Topic Date Due   Hepatitis C Screening  Never done   Hepatitis B Vaccines 19-59 Average Risk (1 of 3 - 19+ 3-dose series) Never done   HPV VACCINES (1 - 3-dose SCDM series) Never done   Influenza Vaccine  Never done   COVID-19 Vaccine (1 - 2024-25 season) Never done   The following portions of the patient's history were reviewed and updated as appropriate: allergies, current medications, past family history, past medical history, past social history, past surgical history and problem list. Problem list updated.  Objective:   Vitals:   02/05/24 1010  BP: 122/65  Pulse: 62  Weight: 162 lb 12.8 oz (73.8 kg)   Physical Exam Vitals and nursing note reviewed. Exam conducted with a chaperone present Brett Orange).  Constitutional:      Appearance: Normal appearance.  HENT:     Head: Normocephalic and atraumatic.     Mouth/Throat:     Mouth: Mucous membranes are moist.     Pharynx: Oropharynx is clear. No oropharyngeal exudate or posterior oropharyngeal erythema.  Pulmonary:     Effort: Pulmonary effort is normal.  Abdominal:     General: Abdomen is flat.     Palpations: Abdomen is soft.   Genitourinary:    General: Normal vulva.     Exam position: Lithotomy position.     Pubic Area: No rash or pubic lice.      Labia:        Right: No rash or lesion.        Left: No rash or lesion.      Vagina: Normal. No vaginal discharge, erythema, bleeding or lesions.     Cervix: No cervical motion tenderness, discharge, friability, lesion or erythema.     Uterus: Normal.      Adnexa: Right adnexa normal and left adnexa normal.     Rectum: Normal.     Comments: pH = <4.5 Nabothian cysts on cervix Lymphadenopathy:     Head:     Right side of head: No preauricular or posterior auricular adenopathy.     Left side of head: No preauricular or posterior auricular adenopathy.     Cervical: No cervical adenopathy.     Upper Body:     Right upper body: No supraclavicular, axillary or epitrochlear adenopathy.     Left upper body: No supraclavicular, axillary or epitrochlear adenopathy.  Lower Body: No right inguinal adenopathy. No left inguinal adenopathy.  Skin:    General: Skin is warm and dry.     Findings: No rash.  Neurological:     Mental Status: She is alert and oriented to person, place, and time.    Assessment and Plan:  Penny Becker is a 33 y.o. female [phone] presenting to the [facility] Department for an yearly wellness and contraception visit  Contraception counseling:  Reviewed options based on patient desire and reproductive life plan. Patient is interested in Hormonal Implant. This was not provided to the patient today.   Risks, benefits, and typical effectiveness rates were reviewed.  Questions were answered.  Written information was also given to the patient to review.    The patient will follow up in  4 weeks for surveillance.  The patient was told to call with any further questions, or with any concerns about this method of contraception.  Emphasized use of condoms 100% of the time for STI prevention.  Emergency Contraception Precautions  (ECP): Patient assessed for need of ECP. She is not a candidate based on no intercourse in >5 days.  1. Screening for venereal disease (Primary)  - WET PREP FOR TRICH, YEAST, CLUE - HIV [place] [facility] - Syphilis Serology, [place] [facility] - Chlamydia/Gonorrhea [place] [facility]  2. Encounter for removal and reinsertion of Nexplanon   - Pregnancy, urine negative - Beta hCG quant (ref lab) - Per patient, had a missed period in September and a lighter than normal period in August. Has a 35 year old Nexplanon  in place. Had a pregnancy in the past where the urine test was negative but then the blood test was positive. Has had nausea and breast tenderness. - Discuss that missed periods can result from stress (recent break up) - Due to the above, and patient needing to get to another appt, will defer Nexplanon  removal and reinsertion and patient will return in a few weeks for this. - Declined bridge Depo, stating she is not going to be having any sexual activity  3. Screening for cervical cancer  - IGP, Aptima HPV   Return in about 4 weeks (around 03/04/2024).  No future appointments.  Damien FORBES Satchel, NP

## 2024-02-05 NOTE — Progress Notes (Signed)
 Pt is here PE. Wet Prep results reviewed with pt and requires no treatment. Condoms declined. Wilkie Drought, RN.

## 2024-02-06 ENCOUNTER — Ambulatory Visit: Payer: Self-pay

## 2024-02-06 LAB — BETA HCG QUANT (REF LAB): hCG Quant: <1 m[IU]/mL

## 2024-02-06 NOTE — Progress Notes (Signed)
 Urine pregnancy test and beta hCG negative. Wet prep reviewed in clinic.  Message sent to patient via MyChart. Damien Satchel [facility]

## 2024-02-10 LAB — IGP, APTIMA HPV
HPV Aptima: NEGATIVE
PAP Smear Comment: 0

## 2024-02-10 NOTE — Progress Notes (Signed)
 Normal pap test. Negative for intraepithelial lesion/malignancy and negative HPV. Repeat pap test in 5 years.  Damien Satchel [facility]

## 2024-02-17 NOTE — Telephone Encounter (Signed)
 Called pt and verified password. Advised pt of positive test result for Chlamydia. Appt scheduled for tx 02/18/24 @215pm 

## 2024-02-18 ENCOUNTER — Ambulatory Visit

## 2024-02-18 DIAGNOSIS — R87619 Unspecified abnormal cytological findings in specimens from cervix uteri: Secondary | ICD-10-CM

## 2024-02-18 MED ORDER — DOXYCYCLINE HYCLATE 100 MG PO TABS: 100.0000 mg | ORAL_TABLET | Freq: Two times a day (BID) | ORAL | Status: AC

## 2024-02-18 NOTE — Addendum Note (Signed)
 Addended by: MERILEE SALLES B on: 02/18/2024 06:07 PM   Modules accepted: Orders

## 2024-02-18 NOTE — Progress Notes (Signed)
 Patient seen for chlamydia treatment. Reports LMP on 02/11/2024.  Treat per SO by Dr. Dorothyann Helling with doxycycline 100 mg by mouth 2 times per day for 7 days.  Patient voiced no concerns at this time and verbalized follow up in 3 months for TOC.  Kandi KATHEE Glatter, RN

## 2024-04-08 ENCOUNTER — Inpatient Hospital Stay (HOSPITAL_COMMUNITY)
Admission: AD | Admit: 2024-04-08 | Discharge: 2024-04-08 | Disposition: A | Discharge status: Home / Self Care | Attending: Family Medicine | Admitting: Family Medicine

## 2024-04-08 ENCOUNTER — Other Ambulatory Visit: Payer: Self-pay

## 2024-04-08 DIAGNOSIS — R109 Unspecified abdominal pain: Secondary | ICD-10-CM | POA: Diagnosis not present

## 2024-04-08 DIAGNOSIS — Z975 Presence of (intrauterine) contraceptive device: Secondary | ICD-10-CM

## 2024-04-08 DIAGNOSIS — Z3202 Encounter for pregnancy test, result negative: Secondary | ICD-10-CM

## 2024-04-08 DIAGNOSIS — R7989 Other specified abnormal findings of blood chemistry: Secondary | ICD-10-CM

## 2024-04-08 LAB — POCT PREGNANCY, URINE: Preg Test, Ur: NEGATIVE

## 2024-04-08 LAB — HCG, QUANTITATIVE, PREGNANCY: hCG, Beta Chain, Quant, S: 11 m[IU]/mL — ABNORMAL HIGH (ref <5)

## 2024-04-08 MED ORDER — ACETAMINOPHEN 500 MG PO TABS: 1000.0000 mg | ORAL_TABLET | Freq: Four times a day (QID) | ORAL | Status: AC | PRN

## 2024-04-08 NOTE — MAU Provider Note (Signed)
 S [name] is a 33 y.o. [phone] patient who presents to MAU today with complaint of states she had a home positive pregnancy test.  Patient also reports that she has a Nexplanon  that is expired and was placed in 2019 from the health department.  She does not have a primary OB/GYN at this time.  Patient also reports some lower abdominal cramping as well as some spotting.  Denies any active vaginal bleeding, vaginal discharge/irritation/burning and reports no urinary signs or symptoms at this time.  Patient is declining medication at this time for her cramping.  Our pregnancy test here via urine was negative will obtain hCG quant and follow-up per results.   The remainder of the ROS is negative unless otherwise noted in HPI above  O BP 134/70 (BP Location: Right Arm)   Pulse 80   Temp 98.4 F (36.9 C) (Oral)   Resp 20   Ht 5' 7.5 (1.715 m)   Wt 75.7 kg   LMP 03/10/2024   SpO2 100%   BMI 25.74 kg/m  Physical Exam Vitals and nursing note reviewed.  Constitutional:      General: She is not in acute distress.    Appearance: She is well-developed and normal weight. She is not ill-appearing.  HENT:     Head: Normocephalic.     Nose: Nose normal.  Cardiovascular:     Rate and Rhythm: Normal rate.  Pulmonary:     Effort: Pulmonary effort is normal.  Abdominal:     Palpations: Abdomen is soft.     Tenderness: There is no abdominal tenderness.  Musculoskeletal:        General: Normal range of motion.     Cervical back: Normal range of motion.  Skin:    General: Skin is warm.  Neurological:     Mental Status: She is oriented to person, place, and time.  Psychiatric:        Mood and Affect: Mood normal.        Behavior: Behavior normal.     MDM  MODERATE  Patient  chart reviewed Physical exam performed UPT here negative via urine hCG quant Plan to follow-up for results ( HCG quant at 11) Plan for repeat in 48 hours on 04/10/24)  Orders Placed This Encounter   Procedures   hCG, quantitative, pregnancy    Standing Status:   Standing    Number of Occurrences:   1   Pregnancy, urine POC    Standing Status:   Standing    Number of Occurrences:   1   Discharge patient Discharge disposition: 01-Home or Self Care; Discharge patient date: 04/08/2024    Standing Status:   Standing    Number of Occurrences:   1    Discharge disposition:   01-Home or Self Care [1]    Discharge patient date:   04/08/2024      Results for orders placed or performed during the hospital encounter of 04/08/24 (from the past 24 hours)  Pregnancy, urine POC     Status: None   Collection Time: 04/08/24  6:38 PM  Result Value Ref Range   Preg Test, Ur NEGATIVE NEGATIVE  hCG, quantitative, pregnancy     Status: Abnormal   Collection Time: 04/08/24  7:23 PM  Result Value Ref Range   hCG, Beta Chain, Quant, S 11 (H) <5 mIU/mL      I have reviewed the patient chart and performed the physical exam . I have ordered & interpreted the  lab results with the patient A/P as described below.  Counseling and education provided and patient agreeable  with plan as described below. Verbalized understanding.    ASSESSMENT Medical screening exam complete  Elevated serum hCG  Negative pregnancy test  Abdominal cramping  Nexplanon  in place   PLAN  Discharge from MAU in stable condition  Return 04/10/24 for repeat STAT HCG level  in MAU   See AVS for full description of educational information and instructions provided to the patient at time of discharge  List of options for follow-up given   Warning signs for worsening condition that would warrant emergency follow-up discussed  Patient may return to MAU as needed   Penny Becker LABOR, NP 04/08/2024 8:36 PM   This chart was dictated using voice recognition software, Dragon. Despite the best efforts of this provider to proofread and correct errors, errors may still occur which can change documentation meaning.

## 2024-04-08 NOTE — Discharge Instructions (Signed)
 [facility] [employer] [facility] [employer] at [employer] [facility]    Phone: [phone]  [facility] [employer] at [place]   Phone: [phone]  [facility] [employer] at [place]  Phone: [phone]  [facility] [employer] at [employer]  Phone: [phone]  [facility] [employer] at [place]  Phone: [phone]  [facility] at [facility]   Phone: [phone]  [facility]       Phone: [phone]  [facility]    Phone: [phone]   [facility]    Phone: [phone]  [facility]    Phone: [phone]  [facility]    Phone: [phone]  [facility]       Phone: [phone]   [facility]  Phone: [phone]  [name]    Phone: [phone]  [facility]   Phone: [phone]  [facility]      Phone: [phone]  [facility]    Phone: [phone]    Have the expired Nexplanon  removed

## 2024-04-08 NOTE — MAU Note (Signed)
 Penny Becker is a 33 y.o. at Unknown here in MAU reporting: she's had a +HPT, but is having lower abdominal cramping in the LLQ that began yesterday and spotting/scant Vb that began today.  Reports last intercourse was last night.  Has Implanon  inserted in left arm  LMP: 03/09/2024 or 03/10/2024 estimate Onset of complaint: yesterday Pain score: 7 Vitals:   04/08/24 1858  BP: 134/70  Pulse: 80  Resp: 20  Temp: 98.4 F (36.9 C)  SpO2: 100%     FHT: NA  Lab orders placed from triage: UPT

## 2024-05-27 ENCOUNTER — Ambulatory Visit: Admitting: Family Medicine

## 2024-05-27 ENCOUNTER — Ambulatory Visit: Payer: Self-pay | Admitting: Family Medicine

## 2024-05-27 VITALS — BP 121/66 | HR 77 | Ht 69.0 in | Wt 169.6 lb

## 2024-05-27 DIAGNOSIS — Z3046 Encounter for surveillance of implantable subdermal contraceptive: Secondary | ICD-10-CM

## 2024-05-27 DIAGNOSIS — Z3009 Encounter for other general counseling and advice on contraception: Secondary | ICD-10-CM

## 2024-05-27 LAB — PREGNANCY, URINE: Preg Test, Ur: NEGATIVE

## 2024-05-27 MED ORDER — MEDROXYPROGESTERONE ACETATE 150 MG/ML IM SUSP
150.0000 mg | Freq: Once | INTRAMUSCULAR | Status: AC
  Administered 2024-05-27: 150 mg via INTRAMUSCULAR

## 2024-05-27 NOTE — Progress Notes (Signed)
 " [facility] [address], [address] [place] [address] Main phone: [phone]  Women's Health Problem Visit   Subjective:  Penny Becker is a 34 y.o. being seen today for Nexplanon  removal and reinsertion.   Chief Complaint  Patient presents with   Contraception    Nexplanon  removal and reinsertion     HPI  Pt presents today for nexplanon  removal and reinsertion. Previously seen for physical in October 2025, at that time wanted her Nexplanon  removed- this was not done due to questionable pregnancy. Pt went to hospital on 12/4- urine PT negative, however beta Hcg was 11.  Pt has not had follow up since then. Endorses multiple occasions of unprotected sex since 12/4.   Health Maintenance Due  Topic Date Due   Hepatitis C Screening  Never done   Hepatitis B Vaccines 19-59 Average Risk (1 of 3 - 19+ 3-dose series) Never done   Influenza Vaccine  Never done   COVID-19 Vaccine (1 - 2025-26 season) Never done    ROS  The following portions of the patient's history were reviewed and updated as appropriate: allergies, current medications, past family history, past medical history, past social history, past surgical history and problem list. Problem list updated.  See flowsheet for other program required questions.  Objective:   Vitals:   05/27/24 1056  BP: 121/66  Pulse: 77  Weight: 169 lb 9.6 oz (76.9 kg)  Height: 5' 9 (1.753 m)    Physical Exam -deferred- PE done 02/2024  Assessment and Plan:  Penny Becker is a 34 y.o. female presenting to the [facility] Department for a Women's Health problem visit  1. Family planning (Primary) -Pt reports LMP 12/5- has reported multiple encounters of intercourse without protection since LMP -While urine PT negative today- pt reports she has had a positive beta HCG with a negative urine PT -last unprotected sex within the last 5 days- offered ECP- pt  politely declined -reviewed that today we cannot provide Nexplanon - as we cannot be reasonably certain that pt is not pregnant -I explained that I would like to see the beta Hcg < 5 prior to placement -pt agrees to depo to bridge until Nexplanon  can be placed  - Pregnancy, urine - Beta hCG quant (ref lab) - medroxyPROGESTERone  (DEPO-PROVERA ) injection 150 mg  2. Encounter for Nexplanon  removal Procedure:  Nexplanon  Removal Patient identified, informed consent performed, consent signed.   Appropriate time out taken. Nexplanon  site identified in the patient's left arm.  Area prepped in usual sterile fashon. 2 ml of 1% lidocaine  with Epinephrine was used to anesthetize the area at the distal end of the implant and along implant site. A small stab incision was made right beside the implant on the distal portion.  The Nexplanon  rod was grasped manually and removed without difficulty.  There was minimal blood loss. There were no complications.  Steri-strips were applied over the small incision.  A pressure bandage was applied to reduce any bruising.  The patient tolerated the procedure well and was given post procedure instructions.    Nexplanon :   Counseled patient to take OTC analgesic starting as soon as lidocaine  starts to wear off and take regularly for at least 48 hr to decrease discomfort.  Specifically to take with food or milk to decrease stomach upset and for IB 600 mg (3 tablets) every 6 hrs; IB 800 mg (4 tablets) every 8 hrs; or Aleve 2 tablets every 12 hrs.  No follow-ups on file.  No future appointments.  Verneta Bers, [place] "

## 2024-05-27 NOTE — Progress Notes (Signed)
 Pt is here for Nexplanon  removal and reinsertion. Nexplanon  removed  successfully from the left arm by Verneta Bers, FNP. Depo IM Injection given to pt at the Rt deltoid and pt tolerated well to injection. Reminder card given and condoms declined, Kwadwo Adit Riddles,RN.

## 2024-05-28 LAB — BETA HCG QUANT (REF LAB): hCG Quant: <1 m[IU]/mL
